# Patient Record
Sex: Female | Born: 1955 | Race: White | Hispanic: No | Marital: Single | Smoking: Former smoker
Health system: Southern US, Community
[De-identification: ages and names within clinical notes are randomized; demographics above are authoritative.]

## PROBLEM LIST (undated history)

## (undated) DIAGNOSIS — C801 Malignant (primary) neoplasm, unspecified: Secondary | ICD-10-CM

## (undated) DIAGNOSIS — R202 Paresthesia of skin: Secondary | ICD-10-CM

## (undated) DIAGNOSIS — K7460 Unspecified cirrhosis of liver: Secondary | ICD-10-CM

## (undated) DIAGNOSIS — N950 Postmenopausal bleeding: Secondary | ICD-10-CM

## (undated) DIAGNOSIS — K8020 Calculus of gallbladder without cholecystitis without obstruction: Secondary | ICD-10-CM

## (undated) DIAGNOSIS — C229 Malignant neoplasm of liver, not specified as primary or secondary: Secondary | ICD-10-CM

## (undated) DIAGNOSIS — F99 Mental disorder, not otherwise specified: Secondary | ICD-10-CM

## (undated) DIAGNOSIS — Z9889 Other specified postprocedural states: Secondary | ICD-10-CM

## (undated) DIAGNOSIS — F319 Bipolar disorder, unspecified: Secondary | ICD-10-CM

## (undated) DIAGNOSIS — R112 Nausea with vomiting, unspecified: Secondary | ICD-10-CM

## (undated) DIAGNOSIS — I10 Essential (primary) hypertension: Secondary | ICD-10-CM

## (undated) DIAGNOSIS — K759 Inflammatory liver disease, unspecified: Secondary | ICD-10-CM

## (undated) DIAGNOSIS — R51 Headache: Secondary | ICD-10-CM

## (undated) DIAGNOSIS — M1990 Unspecified osteoarthritis, unspecified site: Secondary | ICD-10-CM

## (undated) DIAGNOSIS — D61818 Other pancytopenia: Secondary | ICD-10-CM

## (undated) DIAGNOSIS — D649 Anemia, unspecified: Secondary | ICD-10-CM

## (undated) DIAGNOSIS — J449 Chronic obstructive pulmonary disease, unspecified: Secondary | ICD-10-CM

## (undated) DIAGNOSIS — K219 Gastro-esophageal reflux disease without esophagitis: Secondary | ICD-10-CM

## (undated) DIAGNOSIS — N920 Excessive and frequent menstruation with regular cycle: Secondary | ICD-10-CM

## (undated) DIAGNOSIS — R0602 Shortness of breath: Secondary | ICD-10-CM

## (undated) DIAGNOSIS — F1911 Other psychoactive substance abuse, in remission: Secondary | ICD-10-CM

## (undated) HISTORY — DX: Paresthesia of skin: R20.2

## (undated) HISTORY — DX: Other psychoactive substance abuse, in remission: F19.11

## (undated) HISTORY — PX: TUBAL LIGATION: SHX77

## (undated) HISTORY — DX: Bipolar disorder, unspecified: F31.9

## (undated) HISTORY — DX: Chronic obstructive pulmonary disease, unspecified: J44.9

## (undated) HISTORY — DX: Postmenopausal bleeding: N95.0

## (undated) HISTORY — PX: FACIAL RECONSTRUCTION SURGERY: SHX631

## (undated) HISTORY — DX: Excessive and frequent menstruation with regular cycle: N92.0

## (undated) HISTORY — DX: Essential (primary) hypertension: I10

## (undated) HISTORY — PX: MULTIPLE TOOTH EXTRACTIONS: SHX2053

---

## 1999-02-18 ENCOUNTER — Encounter: Admission: RE | Admit: 1999-02-18 | Discharge: 1999-02-18 | Payer: Self-pay | Admitting: Family Medicine

## 1999-03-07 ENCOUNTER — Encounter: Admission: RE | Admit: 1999-03-07 | Discharge: 1999-03-07 | Payer: Self-pay | Admitting: Family Medicine

## 1999-03-08 ENCOUNTER — Emergency Department (HOSPITAL_COMMUNITY): Admission: EM | Admit: 1999-03-08 | Discharge: 1999-03-08 | Payer: Self-pay | Admitting: Emergency Medicine

## 1999-03-21 ENCOUNTER — Encounter: Admission: RE | Admit: 1999-03-21 | Discharge: 1999-03-21 | Payer: Self-pay | Admitting: Family Medicine

## 1999-04-11 ENCOUNTER — Encounter: Admission: RE | Admit: 1999-04-11 | Discharge: 1999-04-11 | Payer: Self-pay | Admitting: Family Medicine

## 1999-04-15 ENCOUNTER — Encounter: Admission: RE | Admit: 1999-04-15 | Discharge: 1999-05-13 | Payer: Self-pay | Admitting: Sports Medicine

## 1999-08-05 ENCOUNTER — Encounter: Admission: RE | Admit: 1999-08-05 | Discharge: 1999-08-05 | Payer: Self-pay | Admitting: Family Medicine

## 1999-08-15 ENCOUNTER — Encounter: Admission: RE | Admit: 1999-08-15 | Discharge: 1999-08-15 | Payer: Self-pay | Admitting: Family Medicine

## 2000-03-20 ENCOUNTER — Encounter: Payer: Self-pay | Admitting: Emergency Medicine

## 2000-03-20 ENCOUNTER — Emergency Department (HOSPITAL_COMMUNITY): Admission: EM | Admit: 2000-03-20 | Discharge: 2000-03-20 | Payer: Self-pay | Admitting: Emergency Medicine

## 2000-03-24 ENCOUNTER — Encounter: Admission: RE | Admit: 2000-03-24 | Discharge: 2000-03-24 | Payer: Self-pay | Admitting: Family Medicine

## 2000-04-02 ENCOUNTER — Encounter: Admission: RE | Admit: 2000-04-02 | Discharge: 2000-05-14 | Payer: Self-pay | Admitting: Family Medicine

## 2000-05-07 ENCOUNTER — Encounter: Admission: RE | Admit: 2000-05-07 | Discharge: 2000-05-07 | Payer: Self-pay | Admitting: Family Medicine

## 2000-06-17 ENCOUNTER — Encounter: Admission: RE | Admit: 2000-06-17 | Discharge: 2000-06-17 | Payer: Self-pay | Admitting: Family Medicine

## 2001-01-22 ENCOUNTER — Encounter: Admission: RE | Admit: 2001-01-22 | Discharge: 2001-01-22 | Payer: Self-pay | Admitting: Sports Medicine

## 2001-02-03 ENCOUNTER — Encounter: Admission: RE | Admit: 2001-02-03 | Discharge: 2001-02-03 | Payer: Self-pay | Admitting: Family Medicine

## 2001-02-15 ENCOUNTER — Encounter: Admission: RE | Admit: 2001-02-15 | Discharge: 2001-02-15 | Payer: Self-pay | Admitting: Family Medicine

## 2001-04-08 ENCOUNTER — Encounter: Admission: RE | Admit: 2001-04-08 | Discharge: 2001-04-08 | Payer: Self-pay | Admitting: Family Medicine

## 2001-05-20 ENCOUNTER — Encounter: Admission: RE | Admit: 2001-05-20 | Discharge: 2001-05-20 | Payer: Self-pay | Admitting: Gastroenterology

## 2001-05-20 ENCOUNTER — Encounter: Payer: Self-pay | Admitting: Gastroenterology

## 2001-06-01 ENCOUNTER — Encounter: Admission: RE | Admit: 2001-06-01 | Discharge: 2001-06-01 | Payer: Self-pay | Admitting: Family Medicine

## 2001-06-03 ENCOUNTER — Encounter: Admission: RE | Admit: 2001-06-03 | Discharge: 2001-06-03 | Payer: Self-pay | Admitting: Sports Medicine

## 2001-06-03 ENCOUNTER — Encounter: Payer: Self-pay | Admitting: Sports Medicine

## 2002-01-03 ENCOUNTER — Encounter: Admission: RE | Admit: 2002-01-03 | Discharge: 2002-01-03 | Payer: Self-pay | Admitting: Family Medicine

## 2002-01-25 ENCOUNTER — Encounter: Admission: RE | Admit: 2002-01-25 | Discharge: 2002-01-25 | Payer: Self-pay | Admitting: Family Medicine

## 2002-04-21 ENCOUNTER — Encounter: Admission: RE | Admit: 2002-04-21 | Discharge: 2002-04-21 | Payer: Self-pay | Admitting: Family Medicine

## 2002-05-19 ENCOUNTER — Encounter: Admission: RE | Admit: 2002-05-19 | Discharge: 2002-05-19 | Payer: Self-pay | Admitting: Family Medicine

## 2002-06-06 ENCOUNTER — Encounter: Admission: RE | Admit: 2002-06-06 | Discharge: 2002-06-06 | Payer: Self-pay | Admitting: Family Medicine

## 2002-08-04 ENCOUNTER — Encounter: Admission: RE | Admit: 2002-08-04 | Discharge: 2002-08-04 | Payer: Self-pay | Admitting: Sports Medicine

## 2002-08-11 ENCOUNTER — Emergency Department (HOSPITAL_COMMUNITY): Admission: EM | Admit: 2002-08-11 | Discharge: 2002-08-11 | Payer: Self-pay | Admitting: Emergency Medicine

## 2002-08-11 ENCOUNTER — Encounter: Admission: RE | Admit: 2002-08-11 | Discharge: 2002-08-11 | Payer: Self-pay | Admitting: Family Medicine

## 2002-08-11 ENCOUNTER — Encounter: Payer: Self-pay | Admitting: Emergency Medicine

## 2002-08-16 ENCOUNTER — Encounter: Admission: RE | Admit: 2002-08-16 | Discharge: 2002-08-16 | Payer: Self-pay | Admitting: Sports Medicine

## 2002-08-16 ENCOUNTER — Encounter: Payer: Self-pay | Admitting: Sports Medicine

## 2002-08-16 ENCOUNTER — Encounter: Admission: RE | Admit: 2002-08-16 | Discharge: 2002-08-16 | Payer: Self-pay | Admitting: *Deleted

## 2002-08-29 ENCOUNTER — Encounter: Admission: RE | Admit: 2002-08-29 | Discharge: 2002-08-29 | Payer: Self-pay | Admitting: Family Medicine

## 2002-09-09 ENCOUNTER — Encounter: Admission: RE | Admit: 2002-09-09 | Discharge: 2002-09-09 | Payer: Self-pay | Admitting: Family Medicine

## 2002-09-09 ENCOUNTER — Ambulatory Visit (HOSPITAL_COMMUNITY): Admission: RE | Admit: 2002-09-09 | Discharge: 2002-09-09 | Payer: Self-pay | Admitting: Family Medicine

## 2002-10-06 ENCOUNTER — Encounter: Admission: RE | Admit: 2002-10-06 | Discharge: 2002-10-06 | Payer: Self-pay | Admitting: Sports Medicine

## 2003-04-06 ENCOUNTER — Emergency Department (HOSPITAL_COMMUNITY): Admission: AC | Admit: 2003-04-06 | Discharge: 2003-04-06 | Payer: Self-pay | Admitting: Emergency Medicine

## 2003-04-27 ENCOUNTER — Encounter: Admission: RE | Admit: 2003-04-27 | Discharge: 2003-04-27 | Payer: Self-pay | Admitting: Family Medicine

## 2003-05-09 ENCOUNTER — Encounter: Admission: RE | Admit: 2003-05-09 | Discharge: 2003-05-09 | Payer: Self-pay | Admitting: Sports Medicine

## 2003-06-28 ENCOUNTER — Encounter: Admission: RE | Admit: 2003-06-28 | Discharge: 2003-06-28 | Payer: Self-pay | Admitting: Family Medicine

## 2003-07-04 ENCOUNTER — Encounter
Admission: RE | Admit: 2003-07-04 | Discharge: 2003-10-02 | Payer: Self-pay | Admitting: Physical Medicine and Rehabilitation

## 2003-07-06 ENCOUNTER — Encounter: Admission: RE | Admit: 2003-07-06 | Discharge: 2003-07-06 | Payer: Self-pay | Admitting: Family Medicine

## 2003-07-25 ENCOUNTER — Encounter: Admission: RE | Admit: 2003-07-25 | Discharge: 2003-07-25 | Payer: Self-pay | Admitting: Sports Medicine

## 2003-08-08 ENCOUNTER — Encounter: Admission: RE | Admit: 2003-08-08 | Discharge: 2003-08-08 | Payer: Self-pay | Admitting: Family Medicine

## 2004-01-17 ENCOUNTER — Other Ambulatory Visit: Admission: RE | Admit: 2004-01-17 | Discharge: 2004-01-17 | Payer: Self-pay | Admitting: Family Medicine

## 2004-01-17 ENCOUNTER — Encounter: Admission: RE | Admit: 2004-01-17 | Discharge: 2004-01-17 | Payer: Self-pay | Admitting: Family Medicine

## 2004-02-02 ENCOUNTER — Encounter: Admission: RE | Admit: 2004-02-02 | Discharge: 2004-02-02 | Payer: Self-pay | Admitting: Sports Medicine

## 2004-02-06 ENCOUNTER — Encounter: Admission: RE | Admit: 2004-02-06 | Discharge: 2004-02-06 | Payer: Self-pay | Admitting: Family Medicine

## 2004-05-07 ENCOUNTER — Ambulatory Visit: Payer: Self-pay | Admitting: Family Medicine

## 2004-05-22 ENCOUNTER — Ambulatory Visit: Payer: Self-pay | Admitting: Family Medicine

## 2004-05-27 ENCOUNTER — Ambulatory Visit: Payer: Self-pay | Admitting: Sports Medicine

## 2004-06-11 ENCOUNTER — Ambulatory Visit: Payer: Self-pay | Admitting: Family Medicine

## 2004-07-10 ENCOUNTER — Ambulatory Visit: Payer: Self-pay | Admitting: Family Medicine

## 2004-08-08 ENCOUNTER — Ambulatory Visit: Payer: Self-pay | Admitting: Family Medicine

## 2004-09-18 ENCOUNTER — Ambulatory Visit: Payer: Self-pay | Admitting: Family Medicine

## 2004-10-24 ENCOUNTER — Ambulatory Visit: Payer: Self-pay | Admitting: Sports Medicine

## 2005-01-08 ENCOUNTER — Ambulatory Visit: Payer: Self-pay | Admitting: Family Medicine

## 2005-02-18 ENCOUNTER — Encounter: Admission: RE | Admit: 2005-02-18 | Discharge: 2005-02-18 | Payer: Self-pay | Admitting: Sports Medicine

## 2005-03-26 ENCOUNTER — Encounter (INDEPENDENT_AMBULATORY_CARE_PROVIDER_SITE_OTHER): Payer: Self-pay | Admitting: *Deleted

## 2005-03-26 LAB — CONVERTED CEMR LAB

## 2005-03-27 ENCOUNTER — Ambulatory Visit: Payer: Self-pay | Admitting: Family Medicine

## 2005-03-27 ENCOUNTER — Inpatient Hospital Stay (HOSPITAL_COMMUNITY): Admission: AD | Admit: 2005-03-27 | Discharge: 2005-03-27 | Payer: Self-pay | Admitting: Obstetrics & Gynecology

## 2005-03-27 ENCOUNTER — Other Ambulatory Visit: Admission: RE | Admit: 2005-03-27 | Discharge: 2005-03-27 | Payer: Self-pay | Admitting: Family Medicine

## 2005-04-14 ENCOUNTER — Ambulatory Visit: Payer: Self-pay | Admitting: Family Medicine

## 2005-05-12 ENCOUNTER — Ambulatory Visit: Payer: Self-pay | Admitting: Family Medicine

## 2005-05-26 ENCOUNTER — Inpatient Hospital Stay (HOSPITAL_COMMUNITY): Admission: AD | Admit: 2005-05-26 | Discharge: 2005-05-26 | Payer: Self-pay | Admitting: Obstetrics and Gynecology

## 2005-06-03 ENCOUNTER — Ambulatory Visit: Payer: Self-pay | Admitting: Obstetrics & Gynecology

## 2005-06-06 ENCOUNTER — Ambulatory Visit: Payer: Self-pay | Admitting: Family Medicine

## 2005-06-16 ENCOUNTER — Ambulatory Visit: Payer: Self-pay | Admitting: Family Medicine

## 2005-08-11 ENCOUNTER — Ambulatory Visit: Payer: Self-pay | Admitting: Sports Medicine

## 2005-08-17 ENCOUNTER — Inpatient Hospital Stay (HOSPITAL_COMMUNITY): Admission: EM | Admit: 2005-08-17 | Discharge: 2005-08-23 | Payer: Self-pay | Admitting: Psychiatry

## 2005-08-17 ENCOUNTER — Emergency Department (HOSPITAL_COMMUNITY): Admission: EM | Admit: 2005-08-17 | Discharge: 2005-08-17 | Payer: Self-pay | Admitting: Emergency Medicine

## 2005-08-18 ENCOUNTER — Ambulatory Visit: Payer: Self-pay | Admitting: Psychiatry

## 2005-12-05 ENCOUNTER — Ambulatory Visit: Payer: Self-pay | Admitting: Family Medicine

## 2006-02-06 ENCOUNTER — Ambulatory Visit: Payer: Self-pay | Admitting: Family Medicine

## 2006-05-13 ENCOUNTER — Ambulatory Visit: Payer: Self-pay | Admitting: Sports Medicine

## 2006-07-06 ENCOUNTER — Ambulatory Visit: Payer: Self-pay | Admitting: Sports Medicine

## 2006-08-10 ENCOUNTER — Other Ambulatory Visit: Admission: RE | Admit: 2006-08-10 | Discharge: 2006-08-10 | Payer: Self-pay | Admitting: Family Medicine

## 2006-08-10 ENCOUNTER — Encounter (INDEPENDENT_AMBULATORY_CARE_PROVIDER_SITE_OTHER): Payer: Self-pay | Admitting: Family Medicine

## 2006-08-10 ENCOUNTER — Ambulatory Visit: Payer: Self-pay | Admitting: Sports Medicine

## 2006-08-10 LAB — CONVERTED CEMR LAB
ALT: 33 units/L (ref 0–35)
AST: 49 units/L — ABNORMAL HIGH (ref 0–37)
Albumin: 3.9 g/dL (ref 3.5–5.2)
Alkaline Phosphatase: 93 units/L (ref 39–117)
BUN: 8 mg/dL (ref 6–23)
CO2: 22 meq/L (ref 19–32)
Calcium: 8.9 mg/dL (ref 8.4–10.5)
Chlamydia, DNA Probe: NEGATIVE
Chloride: 109 meq/L (ref 96–112)
Cholesterol: 132 mg/dL (ref 0–200)
Creatinine, Ser: 0.76 mg/dL (ref 0.40–1.20)
GC Probe Amp, Genital: NEGATIVE
Glucose, Bld: 81 mg/dL (ref 70–99)
HDL: 40 mg/dL (ref >39)
LDL Cholesterol: 76 mg/dL (ref 0–99)
Lithium Lvl: 0.96 meq/L (ref 0.80–1.40)
Potassium: 4.6 meq/L (ref 3.5–5.3)
Sodium: 140 meq/L (ref 135–145)
Total Bilirubin: 0.4 mg/dL (ref 0.3–1.2)
Total CHOL/HDL Ratio: 3.3
Total Protein: 7.6 g/dL (ref 6.0–8.3)
Triglycerides: 82 mg/dL (ref <150)
VLDL: 16 mg/dL (ref 0–40)

## 2006-09-18 ENCOUNTER — Encounter (INDEPENDENT_AMBULATORY_CARE_PROVIDER_SITE_OTHER): Payer: Self-pay | Admitting: *Deleted

## 2006-10-22 ENCOUNTER — Telehealth: Payer: Self-pay | Admitting: Psychology

## 2006-11-18 ENCOUNTER — Ambulatory Visit: Payer: Self-pay | Admitting: Family Medicine

## 2006-11-18 DIAGNOSIS — F319 Bipolar disorder, unspecified: Secondary | ICD-10-CM | POA: Insufficient documentation

## 2006-11-26 ENCOUNTER — Telehealth: Payer: Self-pay | Admitting: Psychology

## 2006-12-01 ENCOUNTER — Telehealth: Payer: Self-pay | Admitting: Psychology

## 2006-12-07 ENCOUNTER — Telehealth: Payer: Self-pay | Admitting: Psychology

## 2006-12-23 ENCOUNTER — Ambulatory Visit: Payer: Self-pay | Admitting: Family Medicine

## 2007-01-20 ENCOUNTER — Telehealth: Payer: Self-pay | Admitting: Psychology

## 2007-02-22 ENCOUNTER — Encounter (INDEPENDENT_AMBULATORY_CARE_PROVIDER_SITE_OTHER): Payer: Self-pay | Admitting: *Deleted

## 2007-02-22 ENCOUNTER — Ambulatory Visit (HOSPITAL_COMMUNITY): Admission: RE | Admit: 2007-02-22 | Discharge: 2007-02-22 | Payer: Self-pay | Admitting: Sports Medicine

## 2007-02-23 ENCOUNTER — Telehealth (INDEPENDENT_AMBULATORY_CARE_PROVIDER_SITE_OTHER): Payer: Self-pay | Admitting: *Deleted

## 2007-02-25 ENCOUNTER — Encounter (INDEPENDENT_AMBULATORY_CARE_PROVIDER_SITE_OTHER): Payer: Self-pay | Admitting: *Deleted

## 2007-03-04 ENCOUNTER — Telehealth (INDEPENDENT_AMBULATORY_CARE_PROVIDER_SITE_OTHER): Payer: Self-pay | Admitting: *Deleted

## 2007-03-08 ENCOUNTER — Telehealth (INDEPENDENT_AMBULATORY_CARE_PROVIDER_SITE_OTHER): Payer: Self-pay | Admitting: *Deleted

## 2007-03-12 ENCOUNTER — Telehealth (INDEPENDENT_AMBULATORY_CARE_PROVIDER_SITE_OTHER): Payer: Self-pay | Admitting: *Deleted

## 2007-04-07 ENCOUNTER — Ambulatory Visit: Payer: Self-pay | Admitting: Family Medicine

## 2007-04-07 ENCOUNTER — Telehealth (INDEPENDENT_AMBULATORY_CARE_PROVIDER_SITE_OTHER): Payer: Self-pay | Admitting: *Deleted

## 2007-05-11 ENCOUNTER — Ambulatory Visit: Payer: Self-pay | Admitting: Family Medicine

## 2007-05-11 DIAGNOSIS — I10 Essential (primary) hypertension: Secondary | ICD-10-CM

## 2007-05-11 DIAGNOSIS — J449 Chronic obstructive pulmonary disease, unspecified: Secondary | ICD-10-CM

## 2007-06-02 ENCOUNTER — Telehealth: Payer: Self-pay | Admitting: Psychology

## 2007-06-09 ENCOUNTER — Ambulatory Visit: Payer: Self-pay | Admitting: Psychology

## 2007-06-09 ENCOUNTER — Encounter (INDEPENDENT_AMBULATORY_CARE_PROVIDER_SITE_OTHER): Payer: Self-pay | Admitting: *Deleted

## 2007-06-09 LAB — CONVERTED CEMR LAB
Lithium Lvl: 0.91 meq/L (ref 0.80–1.40)
TSH: 3.023 microintl units/mL (ref 0.350–5.50)

## 2007-06-10 ENCOUNTER — Encounter (INDEPENDENT_AMBULATORY_CARE_PROVIDER_SITE_OTHER): Payer: Self-pay | Admitting: *Deleted

## 2007-06-11 ENCOUNTER — Ambulatory Visit: Payer: Self-pay | Admitting: Family Medicine

## 2007-06-11 DIAGNOSIS — F17200 Nicotine dependence, unspecified, uncomplicated: Secondary | ICD-10-CM | POA: Insufficient documentation

## 2007-07-30 ENCOUNTER — Ambulatory Visit: Payer: Self-pay | Admitting: Family Medicine

## 2007-08-26 ENCOUNTER — Telehealth: Payer: Self-pay | Admitting: Psychology

## 2007-08-27 ENCOUNTER — Telehealth (INDEPENDENT_AMBULATORY_CARE_PROVIDER_SITE_OTHER): Payer: Self-pay | Admitting: *Deleted

## 2007-09-08 ENCOUNTER — Ambulatory Visit: Payer: Self-pay | Admitting: Psychology

## 2007-09-08 ENCOUNTER — Ambulatory Visit: Payer: Self-pay | Admitting: Family Medicine

## 2007-09-21 ENCOUNTER — Encounter (INDEPENDENT_AMBULATORY_CARE_PROVIDER_SITE_OTHER): Payer: Self-pay | Admitting: *Deleted

## 2007-09-23 ENCOUNTER — Encounter (INDEPENDENT_AMBULATORY_CARE_PROVIDER_SITE_OTHER): Payer: Self-pay | Admitting: *Deleted

## 2007-09-23 ENCOUNTER — Telehealth (INDEPENDENT_AMBULATORY_CARE_PROVIDER_SITE_OTHER): Payer: Self-pay | Admitting: *Deleted

## 2007-09-23 ENCOUNTER — Ambulatory Visit: Payer: Self-pay | Admitting: Family Medicine

## 2007-09-29 ENCOUNTER — Ambulatory Visit: Payer: Self-pay | Admitting: Family Medicine

## 2007-09-29 DIAGNOSIS — M5412 Radiculopathy, cervical region: Secondary | ICD-10-CM | POA: Insufficient documentation

## 2007-10-01 ENCOUNTER — Encounter (INDEPENDENT_AMBULATORY_CARE_PROVIDER_SITE_OTHER): Payer: Self-pay | Admitting: *Deleted

## 2007-10-13 ENCOUNTER — Ambulatory Visit: Payer: Self-pay | Admitting: Family Medicine

## 2007-10-13 ENCOUNTER — Encounter (INDEPENDENT_AMBULATORY_CARE_PROVIDER_SITE_OTHER): Payer: Self-pay | Admitting: *Deleted

## 2007-10-13 LAB — CONVERTED CEMR LAB
ALT: 50 units/L — ABNORMAL HIGH (ref 0–35)
AST: 64 units/L — ABNORMAL HIGH (ref 0–37)
Albumin: 4 g/dL (ref 3.5–5.2)
Alkaline Phosphatase: 98 units/L (ref 39–117)
BUN: 15 mg/dL (ref 6–23)
CO2: 21 meq/L (ref 19–32)
Calcium: 9.5 mg/dL (ref 8.4–10.5)
Chloride: 108 meq/L (ref 96–112)
Cholesterol: 139 mg/dL (ref 0–200)
Creatinine, Ser: 0.83 mg/dL (ref 0.40–1.20)
Glucose, Bld: 87 mg/dL (ref 70–99)
HCV Ab: POSITIVE — AB
HDL: 40 mg/dL (ref >39)
Hep B S Ab: NEGATIVE
Hepatitis B Surface Ag: NEGATIVE
LDL Cholesterol: 74 mg/dL (ref 0–99)
Lithium Lvl: 0.79 meq/L — ABNORMAL LOW (ref 0.80–1.40)
Potassium: 4.8 meq/L (ref 3.5–5.3)
Sodium: 138 meq/L (ref 135–145)
TSH: 2.316 microintl units/mL (ref 0.350–5.50)
Total Bilirubin: 0.2 mg/dL — ABNORMAL LOW (ref 0.3–1.2)
Total CHOL/HDL Ratio: 3.5
Total Protein: 7.7 g/dL (ref 6.0–8.3)
Triglycerides: 127 mg/dL (ref <150)
VLDL: 25 mg/dL (ref 0–40)

## 2007-10-18 ENCOUNTER — Ambulatory Visit: Payer: Self-pay | Admitting: Family Medicine

## 2007-11-15 ENCOUNTER — Ambulatory Visit: Payer: Self-pay | Admitting: Family Medicine

## 2007-11-15 ENCOUNTER — Encounter (INDEPENDENT_AMBULATORY_CARE_PROVIDER_SITE_OTHER): Payer: Self-pay | Admitting: *Deleted

## 2007-11-19 ENCOUNTER — Encounter (INDEPENDENT_AMBULATORY_CARE_PROVIDER_SITE_OTHER): Payer: Self-pay | Admitting: *Deleted

## 2007-11-23 ENCOUNTER — Telehealth: Payer: Self-pay | Admitting: Family Medicine

## 2007-12-15 ENCOUNTER — Ambulatory Visit: Payer: Self-pay | Admitting: Family Medicine

## 2007-12-21 ENCOUNTER — Telehealth (INDEPENDENT_AMBULATORY_CARE_PROVIDER_SITE_OTHER): Payer: Self-pay | Admitting: *Deleted

## 2007-12-21 ENCOUNTER — Ambulatory Visit: Payer: Self-pay | Admitting: Family Medicine

## 2007-12-21 ENCOUNTER — Encounter: Admission: RE | Admit: 2007-12-21 | Discharge: 2007-12-21 | Payer: Self-pay | Admitting: *Deleted

## 2007-12-21 ENCOUNTER — Encounter (INDEPENDENT_AMBULATORY_CARE_PROVIDER_SITE_OTHER): Payer: Self-pay | Admitting: *Deleted

## 2007-12-21 LAB — CONVERTED CEMR LAB
CO2: 23 meq/L (ref 19–32)
Glucose, Bld: 73 mg/dL (ref 70–99)
Potassium: 4.2 meq/L (ref 3.5–5.3)
Sodium: 140 meq/L (ref 135–145)

## 2007-12-23 ENCOUNTER — Telehealth (INDEPENDENT_AMBULATORY_CARE_PROVIDER_SITE_OTHER): Payer: Self-pay | Admitting: *Deleted

## 2007-12-24 ENCOUNTER — Encounter: Admission: RE | Admit: 2007-12-24 | Discharge: 2007-12-24 | Payer: Self-pay | Admitting: *Deleted

## 2007-12-27 ENCOUNTER — Telehealth: Payer: Self-pay | Admitting: Psychology

## 2007-12-29 ENCOUNTER — Telehealth (INDEPENDENT_AMBULATORY_CARE_PROVIDER_SITE_OTHER): Payer: Self-pay | Admitting: *Deleted

## 2008-01-26 ENCOUNTER — Ambulatory Visit: Payer: Self-pay | Admitting: Psychology

## 2008-02-09 ENCOUNTER — Ambulatory Visit: Payer: Self-pay | Admitting: Family Medicine

## 2008-02-09 ENCOUNTER — Encounter: Payer: Self-pay | Admitting: Psychology

## 2008-02-10 LAB — CONVERTED CEMR LAB
CO2: 23 meq/L (ref 19–32)
Calcium: 9.8 mg/dL (ref 8.4–10.5)
Glucose, Bld: 58 mg/dL — ABNORMAL LOW (ref 70–99)
Lithium Lvl: 1.36 meq/L (ref 0.80–1.40)
Sodium: 139 meq/L (ref 135–145)
TSH: 0.027 microintl units/mL — ABNORMAL LOW (ref 0.350–4.50)
Total Bilirubin: 0.3 mg/dL (ref 0.3–1.2)
Total Protein: 6.8 g/dL (ref 6.0–8.3)

## 2008-03-08 ENCOUNTER — Telehealth: Payer: Self-pay | Admitting: Psychology

## 2008-03-23 ENCOUNTER — Telehealth: Payer: Self-pay | Admitting: Psychology

## 2008-03-29 ENCOUNTER — Telehealth: Payer: Self-pay | Admitting: *Deleted

## 2008-04-05 ENCOUNTER — Ambulatory Visit: Payer: Self-pay | Admitting: Family Medicine

## 2008-04-28 ENCOUNTER — Encounter (INDEPENDENT_AMBULATORY_CARE_PROVIDER_SITE_OTHER): Payer: Self-pay | Admitting: *Deleted

## 2008-04-28 ENCOUNTER — Encounter (INDEPENDENT_AMBULATORY_CARE_PROVIDER_SITE_OTHER): Payer: Self-pay | Admitting: Family Medicine

## 2008-04-28 ENCOUNTER — Ambulatory Visit: Payer: Self-pay | Admitting: Family Medicine

## 2008-04-28 ENCOUNTER — Telehealth (INDEPENDENT_AMBULATORY_CARE_PROVIDER_SITE_OTHER): Payer: Self-pay | Admitting: *Deleted

## 2008-04-28 LAB — CONVERTED CEMR LAB
AST: 37 units/L (ref 0–37)
BUN: 10 mg/dL (ref 6–23)
CO2: 22 meq/L (ref 19–32)
Calcium: 9.5 mg/dL (ref 8.4–10.5)
Chlamydia, DNA Probe: NEGATIVE
Chloride: 108 meq/L (ref 96–112)
Creatinine, Ser: 0.74 mg/dL (ref 0.40–1.20)
Direct LDL: 86 mg/dL
Glucose, Bld: 105 mg/dL — ABNORMAL HIGH (ref 70–99)
Lithium Lvl: 0.83 meq/L (ref 0.80–1.40)
Pap Smear: NORMAL

## 2008-05-05 ENCOUNTER — Encounter (INDEPENDENT_AMBULATORY_CARE_PROVIDER_SITE_OTHER): Payer: Self-pay | Admitting: Family Medicine

## 2008-05-24 ENCOUNTER — Telehealth: Payer: Self-pay | Admitting: Psychology

## 2008-06-07 ENCOUNTER — Ambulatory Visit: Payer: Self-pay | Admitting: Family Medicine

## 2008-07-03 ENCOUNTER — Ambulatory Visit: Payer: Self-pay | Admitting: Family Medicine

## 2008-08-22 ENCOUNTER — Encounter (INDEPENDENT_AMBULATORY_CARE_PROVIDER_SITE_OTHER): Payer: Self-pay | Admitting: Family Medicine

## 2008-08-23 ENCOUNTER — Telehealth: Payer: Self-pay | Admitting: Psychology

## 2008-09-06 ENCOUNTER — Telehealth: Payer: Self-pay | Admitting: Psychology

## 2008-09-15 ENCOUNTER — Ambulatory Visit: Payer: Self-pay | Admitting: Family Medicine

## 2008-09-18 ENCOUNTER — Telehealth: Payer: Self-pay | Admitting: Psychology

## 2008-09-28 ENCOUNTER — Telehealth: Payer: Self-pay | Admitting: Psychology

## 2008-09-29 ENCOUNTER — Encounter: Payer: Self-pay | Admitting: Psychology

## 2008-10-03 ENCOUNTER — Ambulatory Visit: Payer: Self-pay | Admitting: Family Medicine

## 2008-10-03 ENCOUNTER — Telehealth (INDEPENDENT_AMBULATORY_CARE_PROVIDER_SITE_OTHER): Payer: Self-pay | Admitting: *Deleted

## 2008-10-03 ENCOUNTER — Encounter: Admission: RE | Admit: 2008-10-03 | Discharge: 2008-10-03 | Payer: Self-pay | Admitting: Family Medicine

## 2008-10-06 ENCOUNTER — Telehealth: Payer: Self-pay | Admitting: Psychology

## 2008-10-24 ENCOUNTER — Telehealth: Payer: Self-pay | Admitting: Psychology

## 2008-10-25 ENCOUNTER — Telehealth: Payer: Self-pay | Admitting: Psychology

## 2008-11-01 ENCOUNTER — Telehealth: Payer: Self-pay | Admitting: Psychology

## 2008-11-08 ENCOUNTER — Ambulatory Visit: Payer: Self-pay | Admitting: Family Medicine

## 2008-11-09 ENCOUNTER — Encounter: Payer: Self-pay | Admitting: *Deleted

## 2008-11-09 ENCOUNTER — Encounter: Payer: Self-pay | Admitting: Psychology

## 2008-11-22 ENCOUNTER — Ambulatory Visit: Payer: Self-pay | Admitting: Family Medicine

## 2008-12-11 ENCOUNTER — Encounter (INDEPENDENT_AMBULATORY_CARE_PROVIDER_SITE_OTHER): Payer: Self-pay | Admitting: Family Medicine

## 2009-01-24 ENCOUNTER — Telehealth (INDEPENDENT_AMBULATORY_CARE_PROVIDER_SITE_OTHER): Payer: Self-pay | Admitting: *Deleted

## 2009-01-24 ENCOUNTER — Telehealth: Payer: Self-pay | Admitting: Psychology

## 2009-01-26 ENCOUNTER — Ambulatory Visit: Payer: Self-pay | Admitting: Family Medicine

## 2009-01-26 DIAGNOSIS — E669 Obesity, unspecified: Secondary | ICD-10-CM | POA: Insufficient documentation

## 2009-01-26 DIAGNOSIS — N920 Excessive and frequent menstruation with regular cycle: Secondary | ICD-10-CM

## 2009-01-29 ENCOUNTER — Telehealth: Payer: Self-pay | Admitting: *Deleted

## 2009-01-30 ENCOUNTER — Encounter: Payer: Self-pay | Admitting: *Deleted

## 2009-02-09 ENCOUNTER — Telehealth: Payer: Self-pay | Admitting: Psychology

## 2009-03-05 ENCOUNTER — Ambulatory Visit: Payer: Self-pay | Admitting: Family Medicine

## 2009-03-05 ENCOUNTER — Telehealth: Payer: Self-pay | Admitting: Family Medicine

## 2009-03-22 ENCOUNTER — Telehealth: Payer: Self-pay | Admitting: Psychology

## 2009-03-29 ENCOUNTER — Ambulatory Visit: Payer: Self-pay | Admitting: Family Medicine

## 2009-04-12 ENCOUNTER — Ambulatory Visit: Payer: Self-pay | Admitting: Obstetrics & Gynecology

## 2009-04-19 ENCOUNTER — Telehealth: Payer: Self-pay | Admitting: Psychology

## 2009-04-19 ENCOUNTER — Ambulatory Visit (HOSPITAL_COMMUNITY): Admission: RE | Admit: 2009-04-19 | Discharge: 2009-04-19 | Payer: Self-pay | Admitting: Obstetrics & Gynecology

## 2009-04-26 ENCOUNTER — Other Ambulatory Visit: Admission: RE | Admit: 2009-04-26 | Discharge: 2009-04-26 | Payer: Self-pay | Admitting: Obstetrics & Gynecology

## 2009-04-26 ENCOUNTER — Ambulatory Visit: Payer: Self-pay | Admitting: Obstetrics and Gynecology

## 2009-05-15 ENCOUNTER — Telehealth: Payer: Self-pay | Admitting: Psychology

## 2009-05-24 ENCOUNTER — Ambulatory Visit: Payer: Self-pay | Admitting: Obstetrics and Gynecology

## 2009-05-24 ENCOUNTER — Encounter: Payer: Self-pay | Admitting: Family Medicine

## 2009-06-06 ENCOUNTER — Ambulatory Visit: Payer: Self-pay | Admitting: Family Medicine

## 2009-06-08 ENCOUNTER — Telehealth: Payer: Self-pay | Admitting: Family Medicine

## 2009-06-08 ENCOUNTER — Ambulatory Visit: Payer: Self-pay | Admitting: Family Medicine

## 2009-06-08 DIAGNOSIS — F1910 Other psychoactive substance abuse, uncomplicated: Secondary | ICD-10-CM

## 2009-06-08 DIAGNOSIS — R209 Unspecified disturbances of skin sensation: Secondary | ICD-10-CM

## 2009-06-11 ENCOUNTER — Emergency Department (HOSPITAL_COMMUNITY): Admission: EM | Admit: 2009-06-11 | Discharge: 2009-06-12 | Payer: Self-pay | Admitting: Emergency Medicine

## 2009-06-18 ENCOUNTER — Telehealth: Payer: Self-pay | Admitting: Psychology

## 2009-06-19 ENCOUNTER — Encounter: Payer: Self-pay | Admitting: Psychology

## 2009-06-19 ENCOUNTER — Ambulatory Visit: Payer: Self-pay | Admitting: Family Medicine

## 2009-06-20 ENCOUNTER — Telehealth: Payer: Self-pay | Admitting: Psychology

## 2009-06-21 ENCOUNTER — Ambulatory Visit: Payer: Self-pay | Admitting: Family Medicine

## 2009-06-21 ENCOUNTER — Encounter: Payer: Self-pay | Admitting: Psychology

## 2009-06-22 ENCOUNTER — Ambulatory Visit: Payer: Self-pay | Admitting: Family Medicine

## 2009-06-22 ENCOUNTER — Telehealth: Payer: Self-pay | Admitting: Psychology

## 2009-06-26 ENCOUNTER — Telehealth: Payer: Self-pay | Admitting: Psychology

## 2009-06-27 ENCOUNTER — Telehealth: Payer: Self-pay | Admitting: Family Medicine

## 2009-08-03 ENCOUNTER — Ambulatory Visit: Payer: Self-pay | Admitting: Family Medicine

## 2009-08-06 ENCOUNTER — Encounter: Admission: RE | Admit: 2009-08-06 | Discharge: 2009-08-06 | Payer: Self-pay | Admitting: Family Medicine

## 2009-08-06 ENCOUNTER — Ambulatory Visit: Payer: Self-pay | Admitting: Family Medicine

## 2009-08-17 ENCOUNTER — Telehealth: Payer: Self-pay | Admitting: Psychology

## 2009-08-22 ENCOUNTER — Ambulatory Visit: Payer: Self-pay | Admitting: Family Medicine

## 2009-09-18 ENCOUNTER — Telehealth: Payer: Self-pay | Admitting: Psychology

## 2009-10-09 ENCOUNTER — Telehealth: Payer: Self-pay | Admitting: Family Medicine

## 2009-10-22 ENCOUNTER — Telehealth: Payer: Self-pay | Admitting: Psychology

## 2010-01-16 ENCOUNTER — Emergency Department (HOSPITAL_COMMUNITY): Admission: EM | Admit: 2010-01-16 | Discharge: 2010-01-16 | Payer: Self-pay | Admitting: Emergency Medicine

## 2010-01-18 ENCOUNTER — Emergency Department (HOSPITAL_COMMUNITY): Admission: EM | Admit: 2010-01-18 | Discharge: 2010-01-18 | Payer: Self-pay | Admitting: Family Medicine

## 2010-01-20 ENCOUNTER — Emergency Department (HOSPITAL_COMMUNITY): Admission: EM | Admit: 2010-01-20 | Discharge: 2010-01-20 | Payer: Self-pay | Admitting: Emergency Medicine

## 2010-01-23 ENCOUNTER — Emergency Department (HOSPITAL_COMMUNITY): Admission: EM | Admit: 2010-01-23 | Discharge: 2010-01-23 | Payer: Self-pay | Admitting: Family Medicine

## 2010-06-25 ENCOUNTER — Encounter: Payer: Self-pay | Admitting: Family Medicine

## 2010-07-02 ENCOUNTER — Ambulatory Visit (HOSPITAL_COMMUNITY): Payer: Self-pay | Admitting: Psychiatry

## 2010-07-17 ENCOUNTER — Ambulatory Visit
Admission: RE | Admit: 2010-07-17 | Discharge: 2010-07-17 | Payer: Self-pay | Source: Home / Self Care | Attending: Family Medicine | Admitting: Family Medicine

## 2010-07-17 ENCOUNTER — Ambulatory Visit: Payer: Self-pay

## 2010-07-17 DIAGNOSIS — M545 Low back pain: Secondary | ICD-10-CM

## 2010-07-17 DIAGNOSIS — R300 Dysuria: Secondary | ICD-10-CM

## 2010-07-17 LAB — CONVERTED CEMR LAB
Nitrite: NEGATIVE
Specific Gravity, Urine: 1.015
WBC Urine, dipstick: NEGATIVE

## 2010-07-29 ENCOUNTER — Encounter: Payer: Self-pay | Admitting: Family Medicine

## 2010-07-29 ENCOUNTER — Ambulatory Visit: Admission: RE | Admit: 2010-07-29 | Discharge: 2010-07-29 | Payer: Self-pay | Source: Home / Self Care

## 2010-07-29 DIAGNOSIS — N760 Acute vaginitis: Secondary | ICD-10-CM | POA: Insufficient documentation

## 2010-08-02 ENCOUNTER — Telehealth: Payer: Self-pay | Admitting: Family Medicine

## 2010-08-11 ENCOUNTER — Encounter: Payer: Self-pay | Admitting: Family Medicine

## 2010-08-11 ENCOUNTER — Encounter: Payer: Self-pay | Admitting: *Deleted

## 2010-08-20 NOTE — Progress Notes (Signed)
Summary: phn msg  Phone Note Call from Patient Call back at Home Phone [phone]   Caller: Patient Summary of Call: called to say she went to Ortho yesterday and has arthritis in both knees wants to know if she can take Aleve - has Hep & liver prob pls advise Initial call taken by: De Nurse,  October 09, 2009 1:35 PM  Follow-up for Phone Call        spoke with Dr. Deirdre Priest. stated she can take the aleve (with food) (report stomach problems) she says she has been told to never take tylenol to try the aleve & report back how well it works Follow-up by: Golden Circle RN,  October 09, 2009 1:37 PM
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
## 2010-08-20 NOTE — Assessment & Plan Note (Signed)
Summary: [facility]   Primary Care Provider:  Ellin Mayhew MD   History of Present Illness: Dorothy Burns wants to talk about Lunesta.  She is "tired" of the Seroquel and Ambien as sleep aids.  She wants a medication that will "knock [me] out."  She is trying to have a happier day.  [name] stresses her out and she is drawing some clear lines with her but it creates some challenging feelings.  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  Although [name] is very similar to Ambien, [name] is hopeful that it will give her the relief she wants.  Agreed to attempt to prescribe it for her ([facility] may have barriers).  Otherwise, will keep medications the same.  Dr. Kathrynn Running will call in refills for Lithium and Klonopin in addition to the [facility].  Today [name] presents as hypomanic.  She is sarcastic and impulsive in her speech in a way that is typical for her.  Will followin three months.  Sooner if something arises.  Orders: Therapy 20-30 min- [facility] ([id number])  Complete Medication List: 1)  Advair Diskus 250-50 Mcg/dose Misc (Fluticasone-salmeterol) .... One puff bid 2)  Ventolin Hfa 108 (90 Base) Mcg/act Aers (Albuterol sulfate) .... 2 to 4 puffs every 4 hours as needed for wheezing 3)  Lithium Carbonate 300 Mg Caps (Lithium carbonate) .... Take five capsules a day.  per dr. Kathrynn Running [facility]. 4)  Spiriva Handihaler 18 Mcg Caps (Tiotropium bromide monohydrate) .... One inh daily 5)  Clonazepam 0.5 Mg Tabs (Clonazepam) .... One tab by mouth three times a day as needed.  per dr. Kathrynn Running [facility]. 6)  Seroquel 100 Mg Tabs (Quetiapine fumarate) .... Take one tablet at night.  per dr. Kathrynn Running in [facility]. 7)  Prilosec 20 Mg Cpdr (Omeprazole) .... Take 1 tablet daily 8)  Mobic 7.5 Mg Tabs  (Meloxicam) .... One by mouth daily 9)  Doxycycline Monohydrate 100 Mg Caps (Doxycycline monohydrate) .... One tab by mouth two times a day x 14 days 10)  Ibuprofen 800 Mg Tabs (Ibuprofen) .... One tab by mouth three times a day as needed pain 11)  Lunesta 3 Mg Tabs (Eszopiclone) .... Per dr. Kathrynn Running in [facility].  Patient Instructions: 1)  Please keep your appt for [facility] for 11/21/09 at 11:00. 2)  Dr. Kathrynn Running will refill your medication by calling it into your pharmacy. 3)  It was good to see you today Dorothy Burns. Prescriptions: LUNESTA 3 MG TABS (ESZOPICLONE) Per Dr. Kathrynn Running in [facility].  #30 x 0   Entered and Authorized by:   Spero Geralds PsyD   Signed by:   Spero Geralds PsyD on 08/22/2009   Method used:   Telephoned to ...         RxID:   [id number] CLONAZEPAM 0.5 MG TABS (CLONAZEPAM) One tab by mouth three times a day as needed.  Per Dr. Kathrynn Running in [facility].  #90 x 2   Entered and Authorized by:   Spero Geralds PsyD   Signed by:   Spero Geralds PsyD on 08/22/2009   Method used:   Telephoned to ...         RxID:   [id number] LITHIUM CARBONATE 300 MG  CAPS (LITHIUM CARBONATE) Take five capsules a day.  Per Dr. Kathrynn Running  in [facility].  #150 x 6   Entered and Authorized by:   Spero Geralds PsyD   Signed by:   Spero Geralds PsyD on 08/22/2009   Method used:   Telephoned to ...         RxID:   [id number]

## 2010-08-20 NOTE — Progress Notes (Signed)
Summary: Requests meds  Phone Note Call from Patient   Caller: Patient Call For: [facility] Summary of Call: Dorothy Burns called to say her medications were never called in.  Looked back at [facility].  Dr. Kathrynn Running was going to call them in secondary to needing to do some authorization for the [facility].  I left him a VM to see what happened with this. Will let Faren know when I hear back.  Follow-up for Phone Call        Checked with the pharmacy - they did not have on file.  Dr. Kathrynn Running provided a script plus two refills. Follow-up by: Spero Geralds PsyD,  September 19, 2009 11:44 AM    Prescriptions: CLONAZEPAM 0.5 MG TABS (CLONAZEPAM) One tab by mouth three times a day as needed.  Per Dr. Kathrynn Running in [facility].  #90 x 2   Entered and Authorized by:   Spero Geralds PsyD   Signed by:   Spero Geralds PsyD on 09/19/2009   Method used:   Historical   RxID:   [id number]

## 2010-08-20 NOTE — Assessment & Plan Note (Signed)
Summary: mrsa?df   Vital Signs:  Patient profile:   55 year old female Height:      64 inches Weight:      203 pounds BMI:     34.97 BSA:     1.97 Temp:     98.0 degrees F Pulse rate:   103 / minute BP sitting:   137 / 90  Vitals Entered By: Jone Baseman CMA (August 03, 2009 2:05 PM) CC: boil on L buttock Is Patient Diabetic? No Pain Assessment Patient in pain? yes     Location: left buttock Intensity: 8   Primary Care Provider:  Ellin Mayhew MD  CC:  boil on L buttock.  History of Present Illness: 1) Boil left buttock: Appeared 4-5 days ago; started as small red circle, gradually increased in size and redness. Denies fever, purulence, streaking erythema, emesis, nausea, diarrhea. Seen about 1 month ago with similar complaint of boil elsewhere, boyfriend with recent history of MRSA; she was treated with doxycycline with improvement. Reports that she has OCD and picks at her skin alot then develops boils.   Habits & Providers  Alcohol-Tobacco-Diet     Tobacco Status: current     Tobacco Counseling: to quit use of tobacco products     Cigarette Packs/Day: 0.25  Current Medications (verified): 1)  Advair Diskus 250-50 Mcg/dose Misc (Fluticasone-Salmeterol) .... One Puff Bid 2)  Ventolin Hfa 108 (90 Base) Mcg/act  Aers (Albuterol Sulfate) .... 2 To 4 Puffs Every 4 Hours As Needed For Wheezing 3)  Lithium Carbonate 300 Mg  Caps (Lithium Carbonate) .... Take Five Capsules A Day.  Per Dr. Kathrynn Running in [facility]. 4)  Spiriva Handihaler 18 Mcg  Caps (Tiotropium Bromide Monohydrate) .... One Inh Daily 5)  Clonazepam 0.5 Mg Tabs (Clonazepam) .... One Tab By Mouth Three Times A Day As Needed.  Per Dr. Kathrynn Running in [facility]. 6)  Seroquel 100 Mg Tabs (Quetiapine Fumarate) .... Take One Tablet At Night.  Per Dr. Kathrynn Running in [facility]. 7)  Prilosec 20 Mg Cpdr (Omeprazole) .... Take 1 Tablet Daily 8)  Mobic 7.5 Mg Tabs (Meloxicam) .... One By Mouth  Daily 9)  Doxycycline Monohydrate 100 Mg Caps (Doxycycline Monohydrate) .... One Tab By Mouth Two Times A Day X 14 Days 10)  Ibuprofen 800 Mg Tabs (Ibuprofen) .... One Tab By Mouth Three Times A Day As Needed Pain  Allergies (verified): No Known Drug Allergies  Social History: Packs/Day:  0.25  Physical Exam  General:  obese, appears jittery,  Neck:  no lymphadenopathy   Lungs:  CTAB w/o crackles  Heart:  RRR, no murmurs Pulses:  2 + radial pulses Extremities:  6 cm area of erythema and induration well circumscribed on left lower buttock w/o streaking erythema, fluctuance, discharge, several areas of excoriation on bilateral legs.   Neurologic:  alert & oriented X3, strength normal in all extremities, and sensation intact to light touch.     Impression & Recommendations:  Problem # 1:  CELLULITIS AND ABSCESS OF BUTTOCK (ICD-682.5) Assessment New  Will treat for MRSA given prior history w/ doxycycline x 14 days. Ibuprofen for pain. Advised warm compresses. No fluctuant areas for I+D. Patient demands narcotic pain medication - per her PCP's plan (w/ hx of substance abuse, multiple requests for narcotics). Patient plans to take a relative's vicodin - advised against this. Follow up with PCP in 2 weeks. Red flags reviewed.  Her updated medication list for this problem includes:  Doxycycline Monohydrate 100 Mg Caps (Doxycycline monohydrate) ..... One tab by mouth two times a day x 14 days  Orders: [facility]- Est Level  3 (19147)  Complete Medication List: 1)  Advair Diskus 250-50 Mcg/dose Misc (Fluticasone-salmeterol) .... One puff bid 2)  Ventolin Hfa 108 (90 Base) Mcg/act Aers (Albuterol sulfate) .... 2 to 4 puffs every 4 hours as needed for wheezing 3)  Lithium Carbonate 300 Mg Caps (Lithium carbonate) .... Take five capsules a day.  per dr. Kathrynn Running in [facility]. 4)  Spiriva Handihaler 18 Mcg Caps (Tiotropium bromide monohydrate) .... One inh daily 5)  Clonazepam 0.5 Mg  Tabs (Clonazepam) .... One tab by mouth three times a day as needed.  per dr. Kathrynn Running in [facility]. 6)  Seroquel 100 Mg Tabs (Quetiapine fumarate) .... Take one tablet at night.  per dr. Kathrynn Running in [facility]. 7)  Prilosec 20 Mg Cpdr (Omeprazole) .... Take 1 tablet daily 8)  Mobic 7.5 Mg Tabs (Meloxicam) .... One by mouth daily 9)  Doxycycline Monohydrate 100 Mg Caps (Doxycycline monohydrate) .... One tab by mouth two times a day x 14 days 10)  Ibuprofen 800 Mg Tabs (Ibuprofen) .... One tab by mouth three times a day as needed pain  Patient Instructions: 1)  Take your doxycycline twice a day for 14 days 2)  take Ibuprofen every 6-8 hours as needed for pain 3)  You can apply warm compresses to the area to help with pain and swelling.  4)  Follow up next week if worsening pain and swelling, fevers not relieved by medication.  Prescriptions: IBUPROFEN 800 MG TABS (IBUPROFEN) One tab by mouth three times a day as needed pain  #30 x 0   Entered and Authorized by:   Bobby Rumpf  MD   Signed by:   Bobby Rumpf  MD on 08/03/2009   Method used:   Electronically to        [facility]  [address] [phone]* (retail)       [address]       [place], [place]  [address]       Ph: [phone]       Fax: [phone]   RxID:   [id number] DOXYCYCLINE MONOHYDRATE 100 MG CAPS (DOXYCYCLINE MONOHYDRATE) One tab by mouth two times a day x 14 days  #28 x 0   Entered and Authorized by:   Bobby Rumpf  MD   Signed by:   Bobby Rumpf  MD on 08/03/2009   Method used:   Electronically to        [facility]  [address] [phone]* (retail)       [address]       [place], [place]  [address]       Ph: [phone]       Fax: [phone]   RxID:   [id number]

## 2010-08-20 NOTE — Progress Notes (Signed)
Summary: Request refills  Phone Note Call from Patient   Caller: Patient Call For: Spero Geralds, Psy.D. Summary of Call: Patient left long VM about needing a refill on her Clonazepam and wanting to try a new sleep med.  Attempted to call patient back x 2.  VM picked up but no beep to leave a message.  Will await patient follow-up. Initial call taken by: Spero Geralds PsyD,  August 17, 2009 9:41 AM  Follow-up for Phone Call        Ninah left a VM Friday afternoon.  I returned it today and left a VM.  I asked her to leave times when she might be available. Follow-up by: Spero Geralds PsyD,  August 20, 2009 3:15 PM  Additional Follow-up for Phone Call Additional follow up Details #1::        Finally connected with Saryn today.  [facility].  Will see her then. Additional Follow-up by: Spero Geralds PsyD,  August 21, 2009 5:09 PM

## 2010-08-20 NOTE — Miscellaneous (Signed)
  Clinical Lists Changes  Problems: Removed problem of INJURY OTHER&UNSPECIFIED KNEE LEG ANKLE&FOOT (ICD-959.7) Removed problem of CELLULITIS AND ABSCESS OF BUTTOCK (ICD-682.5) Removed problem of U R I (ICD-465.9) Removed problem of ABDOMINAL PAIN (ICD-789.00) Removed problem of SKIN RASH (ICD-782.1) Removed problem of ENLARGEMENT OF LYMPH NODES (ICD-785.6) Removed problem of HEPATITIS C, HX OF (ICD-V12.09) Removed problem of INSOMNIA UNSPECIFIED (ICD-780.52) Removed problem of SHOULDER PAIN, LEFT (ICD-719.41) Removed problem of HAND PAIN, RIGHT (ICD-729.5) Removed problem of ARM PAIN, LEFT (ICD-729.5)      Allergies: No Known Drug Allergies

## 2010-08-20 NOTE — Progress Notes (Signed)
Summary: Change [facility] appt time  Phone Note Outgoing Call   Call placed by: Spero Geralds PsyD,  October 22, 2009 4:43 PM Call placed to: Patient Summary of Call: Called patient to request change in [facility] app[facility] time for May 4th.  She said she would like to cancel the appt because she is seeing a new doctor - Dr. Nanine Means (sp??) on [address].  He increased her Clonazepam to 1.0 mg and she is happy with that change so will follow with him.  She would like to keep the door open here though and thanked Dr. Kathrynn Running and me for the care we provided.  She will continue to come to [facility] for her other health issues. Initial call taken by: Spero Geralds PsyD,  October 22, 2009 4:44 PM
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
## 2010-08-21 ENCOUNTER — Encounter: Payer: Self-pay | Admitting: Family Medicine

## 2010-08-22 NOTE — Assessment & Plan Note (Signed)
Summary: back pain   Vital Signs:  Patient profile:   55 year old female Height:      64 inches Weight:      187.8 pounds Temp:     97.7 degrees F oral Pulse rate:   73 / minute BP sitting:   124 / 79  (left arm) Cuff size:   regular  Vitals Entered By: Jimmy Footman, CMA (July 17, 2010 11:06 AM) CC: Lower back pain Is Patient Diabetic? No Pain Assessment Patient in pain? yes        Primary Care Provider:  Ellin Mayhew MD  CC:  Lower back pain.  History of Present Illness: back pain: pt states that she continues to have lower back pain that is consistent with the pain that she has had for many years.  Increased burning sensation in lower back muscles last night.  states that she would like to be checked for a uti.  no fever. no urinary retention, frequency, or pain with urination.  no constipation or diarrhea.  no new trauma to back.  no weakness.  no loss of sensation.  she states she needs to have something more for pain or she is going to go to the ER.  She states that tylenol and motrin do not help.  a warm bath helped her pain minimally.  She states that she has tramadol at home and this doesn't do anything for her.  She also states that she has tried nuerotin and this didn't help in past.    Of note: pt states that she has been seening another physician at another clinic.  He has increased her klonopin and she is happy with this.  He is also seeing her for her normal general medical care but she states that she wants to come her for her pelvic exams.  Pt states that she can not remember this physician's name.  it starts with a "g".  Current Medications (verified): 1)  Advair Diskus 250-50 Mcg/dose Misc (Fluticasone-Salmeterol) .... One Puff Bid 2)  Ventolin Hfa 108 (90 Base) Mcg/act  Aers (Albuterol Sulfate) .... 2 To 4 Puffs Every 4 Hours As Needed For Wheezing 3)  Lithium Carbonate 300 Mg  Caps (Lithium Carbonate) .... Take Five Capsules A Day.  [facility]. 4)  Spiriva Handihaler 18 Mcg  Caps (Tiotropium Bromide Monohydrate) .... One Inh Daily 5)  Clonazepam 1 Mg Tabs (Clonazepam) .... One Tab By Mouth Three Times A Day As Needed.  Per Dr[facility]. 6)  Doxycycline Monohydrate 100 Mg Caps (Doxycycline Monohydrate) .... One Tab By Mouth Two Times A Day X 14 Days 7)  Ibuprofen 800 Mg Tabs (Ibuprofen) .... One Tab By Mouth Three Times A Day As Needed Pain 8)  Ambien 5 Mg Tabs (Zolpidem Tartrate) .... Prescribed By Outside Md- Pt Doesn't Know Name  Allergies (verified): No Known Drug Allergies  Social History: Smokes 1/2  ppd.  H/o polysubstance abuse.  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 Lives w/ 3 kids No ETOH, drugs  pt states that she stopped smoking 01/2010  Review of Systems       as per hpi  Physical Exam  General:  VSS Well-developed,well-nourished,in no acute distress; alert,appropriate and cooperative throughout examination Msk:  normal gait. + pain with palp of lower back and upper gluteal muscles.  no swelling, no warmth.  full  rom of lower ext.  negative straight leg raise.  no loss of sensation.  reflexes present and equal bilateral. strength 5/5 and equal bilateral.     Impression & Recommendations:  Problem # 1:  BACK PAIN, LUMBAR (ICD-724.2)  pt continues to have chronic pain.  Pt states that she already has tramadol at home.  Encouraged pt to take tylenol 650mg  by mouth two times a day and tramadol three times a day as needed for breakthrough pain.  pt states that this will not help and that she is going to have to call 911 if it hurts as bad as it did last night.  Will not prescribe narcotics at this time 2/2 concern for addiction history and the fact that this is a chronic long standing back pain issue.  pt states that she is not interested in PT at this time.  encouraged pt to choose which office she would like to see.   It is not a good idea to use 2 seperate office, since we will not be able to see what each other is prescribing.  Requested that pt have other office send records here or we would be happy to send our records to the new office if she would provide [place] with a name of the doctor and practice.    Orders: [facility]- Est Level  3 (16109)  Complete Medication List: 1)  Advair Diskus 250-50 Mcg/dose Misc (Fluticasone-salmeterol) .... One puff bid 2)  Ventolin Hfa 108 (90 Base) Mcg/act Aers (Albuterol sulfate) .... 2 to 4 puffs every 4 hours as needed for wheezing 3)  Lithium Carbonate 300 Mg Caps (Lithium carbonate) .... Take five capsules a day.  [facility] dr[facility]nn [facility]. 4)  Spiriva Handihaler 18 Mcg Caps (Tiotropium bromide monohydrate) .... One inh daily 5)  Clonazepam 1 Mg Tabs (clonazepam)  .... One tab by mouth three times a day as needed.  per dr[facility] Kathrynn [facility]. 6)  Doxycycline Monohydrate 100 Mg Caps (Doxycycline monohydrate) .... One tab by mouth two times a day x 14 days 7)  Ibuprofen 800 Mg Tabs (Ibuprofen) .... One tab by mouth three times a day as needed pain 8)  Ambien 5 Mg Tabs (Zolpidem tartrate) .... Prescribed by outside md- pt doesn't know name  Other Orders: Urinalysis-[facility] (00000)   Orders Added: 1)  Urinalysis-[facility] [00000] 2)  [facility]- Est Level  3 [99213]    Laboratory Results   Urine Tests  Date/Time Received: July 17, 2010 11:15 AM  Date/Time Reported: July 17, 2010 11:25 AM   Routine Urinalysis   Color: yellow Appearance: Clear Glucose: negative   (Normal Range: Negative) Bilirubin: negative   (Normal Range: Negative) Ketone: negative   (Normal Range: Negative) Spec. Gravity: 1.015   (Normal Range: 1.003-1.035) Blood: negative   (Normal Range: Negative) pH: 7.0   (Normal Range: 5.0-8.0) Protein: negative   (Normal Range: Negative) Urobilinogen: 0.2   (Normal Range: 0-1) Nitrite: negative   (Normal Range: Negative) Leukocyte  Esterace: negative   (Normal Range: Negative)    Comments: .........Marland Kitchenbiochemical negative; microscopic not indicated ...............test performed by......Marland KitchenBonnie A. [place], MLS (ASCP)cm

## 2010-08-22 NOTE — Assessment & Plan Note (Signed)
Summary: cervical cancer screening   Vital Signs:  Patient profile:   55 year old female Height:      64 inches Weight:      184.8 pounds BMI:     31.84 Temp:     98.1 degrees F oral Pulse rate:   71 / minute BP sitting:   137 / 86  (left arm) Cuff size:   regular  Vitals Entered By: Jimmy Footman, CMA (July 29, 2010 2:47 PM) CC: cpe Is Patient Diabetic? No   Primary Care Provider:  Ellin Mayhew MD  CC:  cpe.  History of Present Illness: cervical cancer screening: pt. came to [facility] today for a pap smear-since she prefered a female physican for this exam.  She normally sees Dr. Mikeal Hawthorne telephone number [phone].  Pt also asks to be screened for std's since she thinks her boyfriend may be cheating on her.  She wants to do all lab work at her pcp's office.   Habits & Providers  Alcohol-Tobacco-Diet     Tobacco Status: quit  Current Medications (verified): 1)  Advair Diskus 250-50 Mcg/dose Misc (Fluticasone-Salmeterol) .... One Puff Bid 2)  Ventolin Hfa 108 (90 Base) Mcg/act  Aers (Albuterol Sulfate) .... 2 To 4 Puffs Every 4 Hours As Needed For Wheezing 3)  Lithium Carbonate 300 Mg  Caps (Lithium Carbonate) .... Take Five Capsules A Day.  [facility]. 4)  Spiriva Handihaler 18 Mcg  Caps (Tiotropium Bromide Monohydrate) .... One Inh Daily 5)  Clonazepam 1 Mg Tabs (Clonazepam) .... One Tab By Mouth Three Times A Day As Needed.  Per Dr[facility] Kath[facility]nn [facility]. 6)  Doxycycline Monohydrate 100 Mg Caps (Doxycycline Monohydrate) .... One Tab By Mouth Two Times A Day X 14 Days 7)  Ibuprofen 800 Mg Tabs (Ibuprofen) .... One Tab By Mouth Three Times A Day As Needed Pain 8)  Ambien 5 Mg Tabs (Zolpidem Tartrate) .... Prescribed By Outside Md- Pt Doesn't Know Name  Allergies (verified): No Known Drug Allergies  Social History: Smoking Status:  quit  Review of Systems  The patient denies anorexia, fever, weight loss,  vision loss, chest pain, and syncope.    Physical Exam  General:  VSS Well-developed,well-nourished,in no acute distress; alert,appropriate and cooperative throughout examination Breasts:  No mass, nodules, thickening, tenderness, bulging, retraction, inflamation, nipple discharge or skin changes noted.   Lungs:  Normal respiratory effort, chest expands symmetrically.  Heart:  Normal rate and regular rhythm.  Abdomen:  Bowel sounds positive,abdomen soft and non-tender without masses, organomegaly or hernias noted. Genitalia:  Pelvic Exam:        External: normal female genitalia without lesions or masses        Vagina: normal without lesions or masses        Cervix: normal without lesions or masses        Adnexa: normal bimanual exam without masses or fullness        Uterus: normal by palpation        Pap smear: performed Extremities:  no edema Skin:  Intact without suspicious lesions or rashes Psych:  Oriented X3, memory intact for recent and remote, normally interactive, good eye contact, and not anxious appearing.  appeared unkept.   Impression & Recommendations:  Problem # 1:  SCREENING FOR MALIGNANT NEOPLASM OF THE CERVIX (ICD-V76.2) I will forward the results to Dr. Mikeal Hawthorne telephone number [phone]- address [address], [place], [place] [address] you when  they come back.  On review of her records I see that she is in need of a colonoscopy, mammogram, and lab work to check renal function and lipids.  She states that she would prefer to follow up on these tests with Dr Mikeal Hawthorne.  Also, pt wants to do all lab work at her pcp- recommed that pt also be screened for HIV and RPR when lab work is drawn.  When I call pt with results of pap and GC/Chlam I will remind her of the importance of the HIV and RPR screening.  Orders: Pap Smear-[facility] ([id number]) [facility] - Est  40-64 yrs ([id number])  Complete Medication List: 1)  Advair Diskus 250-50 Mcg/dose Misc (Fluticasone-salmeterol) .... One puff  bid 2)  Ventolin Hfa 108 (90 Base) Mcg/act Aers (Albuterol sulfate) .... 2 to 4 puffs every 4 hours as needed for wheezing 3)  Lithium Carbonate 300 Mg Caps (Lithium carbonate) .... Take five capsules a day.  per dr[facility] Kath[facility]nn [facility]. 4)  Spiriva Handihaler 18 Mcg Caps (Tiotropium bromide monohydrate) .... One inh daily 5)  Clonazepam 1 Mg Tabs (clonazepam)  .... One tab by mouth three times a day as needed.  per dr. Kathrynn Running [facility]. 6)  Doxycycline Monohydrate 100 Mg Caps (Doxycycline monohydrate) .... One tab by mouth two times a day x 14 days 7)  Ibuprofen 800 Mg Tabs (Ibuprofen) .... One tab by mouth three times a day as needed pain 8)  Ambien 5 Mg Tabs (Zolpidem tartrate) .... Prescribed by outside md- pt doesn't know name  Other Orders: GC/Chlamydia-[facility] ([id number])  Patient Instructions: 1)  Dr Mikeal Hawthorne: 2)  Your patient came to [facility] today for a pap smear-since she prefered a female physican for this exam.  I will forward the results to you when they come back.  On review of her records I see that she is in need of a colonoscopy, mammogram, and lab work to check renal function and lipids.  She states that she would prefer to follow up on these tests with you.  If you need any other info from me please give our office a call.    Orders Added: 1)  Pap Smear-[facility] [[id number]] 2)  GC/Chlamydia-[facility] [[id number]] 3)  [facility] - Est  40-64 yrs [[id number]]     Prevention & Chronic Care Immunizations   Influenza vaccine: Fluvax Non-MCR  (07/03/2008)   Influenza vaccine due: 04/28/2009    Tetanus booster: 08/06/2009: Tdap   Tetanus booster due: 01/19/2011    Pneumococcal vaccine: Not documented  Colorectal Screening   Hemoccult: Not documented    Colonoscopy: Not documented   Colonoscopy action/deferral: Refused  (07/29/2010)  Other Screening   Pap smear: normal  (04/28/2008)   Pap smear due: 04/28/2009    Mammogram: normal   (02/22/2007)   Mammogram due: 02/22/2008   Smoking status: quit  (07/29/2010)  Lipids   Total Cholesterol: 139  (10/13/2007)   LDL: 74  (10/13/2007)   LDL Direct: 86  (04/28/2008)   HDL: 40  (10/13/2007)   Triglycerides: 127  (10/13/2007)  Hypertension   Last Blood Pressure: 137 / 86  (07/29/2010)   Serum creatinine: 0.74  (04/28/2008)   Serum potassium 4.5  (04/28/2008)  Self-Management Support :   Personal Goals (by the next clinic visit) :      Personal blood pressure goal: 140/90  (06/08/2009)   Patient will work on the following items until the next clinic visit to reach self-care goals:  Medications and monitoring: take my medicines every day  (06/08/2009)     Eating: drink diet soda or water instead of juice or soda, eat more vegetables, use fresh or frozen vegetables, eat foods that are low in salt, eat baked foods instead of fried foods, eat fruit for snacks and desserts  (06/08/2009)    Hypertension self-management support: Written self-care plan  (06/08/2009)

## 2010-08-22 NOTE — Progress Notes (Signed)
  Phone Note Outgoing Call   Call placed by: Ellin Mayhew, MD Call placed to: Dorothy Burns Summary of Call: called Dorothy Burns to review pap smear and GC/Chlam results with her.  Reminded her to get her bloodwork drawn as we discussed at Dr. Maxwell Caul office- and to not to forget to ask him to check for HIV and syphilis, which we did not do Dorothy Burns had requested all blood work to be drawn at her PCP's office.  I told the Dorothy Burns that i will have the office visit as well as lab results faxed to her pcp's office.

## 2010-10-06 LAB — BASIC METABOLIC PANEL
BUN: 8 mg/dL (ref 6–23)
Calcium: 9.3 mg/dL (ref 8.4–10.5)
GFR calc non Af Amer: >60 mL/min (ref >60)
Glucose, Bld: 98 mg/dL (ref 70–99)
Potassium: 3.7 mEq/L (ref 3.5–5.1)

## 2010-10-06 LAB — WOUND CULTURE

## 2010-10-06 LAB — DIFFERENTIAL
Basophils Absolute: 0 10*3/uL (ref 0.0–0.1)
Basophils Relative: 0 % (ref 0–1)
Lymphocytes Relative: 19 % (ref 12–46)
Neutro Abs: 3.9 10*3/uL (ref 1.7–7.7)
Neutrophils Relative %: 68 % (ref 43–77)

## 2010-10-06 LAB — GLUCOSE, CAPILLARY: Glucose-Capillary: 117 mg/dL — ABNORMAL HIGH (ref 70–99)

## 2010-10-06 LAB — CBC
MCHC: 32 g/dL (ref 30.0–36.0)
Platelets: 194 10*3/uL (ref 150–400)
RDW: 17.8 % — ABNORMAL HIGH (ref 11.5–15.5)
WBC: 5.8 10*3/uL (ref 4.0–10.5)

## 2010-10-22 ENCOUNTER — Inpatient Hospital Stay (INDEPENDENT_AMBULATORY_CARE_PROVIDER_SITE_OTHER)
Admission: RE | Admit: 2010-10-22 | Discharge: 2010-10-22 | Disposition: A | Discharge status: Home / Self Care | Payer: Medicare Other | Source: Ambulatory Visit | Attending: Family Medicine | Admitting: Family Medicine

## 2010-10-22 DIAGNOSIS — F410 Panic disorder [episodic paroxysmal anxiety] without agoraphobia: Secondary | ICD-10-CM

## 2010-10-24 ENCOUNTER — Other Ambulatory Visit: Payer: Self-pay | Admitting: Family Medicine

## 2010-10-24 NOTE — Telephone Encounter (Signed)
refill request for patient of Dr. Edmonia James.

## 2010-10-28 NOTE — Telephone Encounter (Signed)
Refill request

## 2010-10-29 NOTE — Telephone Encounter (Signed)
Refill request

## 2010-12-06 NOTE — Discharge Summary (Signed)
NAMESHAHED, YEOMAN               ACCOUNT NO.:  [id number]   MEDICAL RECORD NO.:  [id number]          PATIENT TYPE:  IPS   LOCATION:  0500                          FACILITY:  [facility]   PHYSICIAN:  Anselm Jungling, MD  DATE OF BIRTH:  1956/07/16   DATE OF ADMISSION:  08/17/2005  DATE OF DISCHARGE:  08/23/2005                                 DISCHARGE SUMMARY   IDENTIFYING DATA AND REASON FOR ADMISSION:  This was the first [facility] admission  for Dorothy Burns, a 55 year old Caucasian female with bipolar disorder admitted  primarily for treatment of crack cocaine dependence, and exacerbation of her  depression. She came to [place] on a regimen of lithium carbonate 300 mg four  times daily.   She had also had a history of intravenous drug abuse, and relapsed on  cocaine approximately 2 years prior to admission, with weekly use escalating  to daily use during the prior 2 weeks. Her exacerbation of depression  involved daily suicidal thoughts. She also indicated a significant degree of  anxiety and agitation. Please refer to the admission note for further  details pertaining to the symptoms, circumstances and history that led to  her hospitalization.   MEDICAL AND LABORATORY:  The patient came to [place] with a history of bronchitis  and COPD, and hepatitis C. She had been taking Advair and albuterol  inhalers, and was on course of doxycycline 100 mg b.i.d. times 7 days which  had begun on August 12, 2005. This was for an acute exacerbation of her  COPD. She also came to [place] with a history of macrocytic anemia. There were no  significant medical issues during this brief inpatient psychiatric stay.   HOSPITAL COURSE:  The patient was admitted to the adult inpatient  psychiatric service. She was placed on a cocaine withdrawal protocol and  started on Symmetrel 100 mg b.i.d. to address cocaine craving. She was  continued on lithium carbonate 300 mg four times daily. Trazodone 100 mg  q.h.s. was utilized for  insomnia.   To further address internal anxiety and agitation, and insomnia, the patient  was started on a regimen of low dose Risperdal, which was gradually  increased to a final dose of 0.25 mg b.i.d. and 1 mg q.h.s..   The patient was tired, depleted, depressed, and spent much of the first few  days of her treatment in bed. She also had a significant degree of nausea  and required p.r.n. doses of Phenergan to address this. She had difficulty  keeping food down. However, this improved gradually over her inpatient stay.   During final 2-3 days of her hospital stay, she was up, dressed, and more  active in unit groups and activities. Her sleep, appetite and p.o. intake  all improved. She was absent suicidal ideation and her mood brightened  significantly. By the eighth hospital day, the patient felt ready for  discharge and was able to state clearly and convincingly that she was absent  any suicidal thoughts or thoughts of self-harm. She felt that she was  strongly committed to continue abstinence from cocaine.   AFTERCARE:  The patient is to follow-up at the [facility]  on August 26, 2005 with Dr. Lang Snow.   DISCHARGE MEDICATIONS:  Symmetrel 100 mg b.i.d., lithium carbonate 300 mg  q.i.d., trazodone 100 mg q.h.s., Risperdal 0.25 mg b.i.d. and 1 mg q.h.s.,  and doxycycline 100 mg b.i.d. for 5 days more.   DISCHARGE DIAGNOSES:  AXIS I: Bipolar disorder by history, cocaine abuse,  early remission.  AXIS II: Deferred.  AXIS III: History of chronic obstructive pulmonary disease, apparent  gastroesophageal reflux disease, history of macrocytic anemia.  AXIS IV: Stressors severe.  AXIS V: Global assessment of functioning on discharge 65.           ______________________________  Anselm Jungling, MD  Electronically Signed     SPB/MEDQ  D:  08/25/2005  T:  08/25/2005  Job:  [id number]
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
## 2010-12-13 ENCOUNTER — Emergency Department (HOSPITAL_COMMUNITY)
Admission: EM | Admit: 2010-12-13 | Discharge: 2010-12-13 | Discharge status: Against Medical Advice | Payer: Medicare Other | Attending: Emergency Medicine | Admitting: Emergency Medicine

## 2010-12-13 ENCOUNTER — Ambulatory Visit (HOSPITAL_COMMUNITY)
Admission: RE | Admit: 2010-12-13 | Discharge: 2010-12-13 | Disposition: A | Discharge status: Home / Self Care | Payer: Medicare Other | Attending: Psychiatry | Admitting: Psychiatry

## 2010-12-13 DIAGNOSIS — F319 Bipolar disorder, unspecified: Secondary | ICD-10-CM | POA: Insufficient documentation

## 2010-12-13 DIAGNOSIS — Z139 Encounter for screening, unspecified: Secondary | ICD-10-CM | POA: Insufficient documentation

## 2010-12-13 LAB — DIFFERENTIAL
Basophils Absolute: 0.1 10*3/uL (ref 0.0–0.1)
Lymphocytes Relative: 24 % (ref 12–46)
Lymphs Abs: 2 10*3/uL (ref 0.7–4.0)
Monocytes Absolute: 0.9 10*3/uL (ref 0.1–1.0)
Neutro Abs: 4.8 10*3/uL (ref 1.7–7.7)

## 2010-12-13 LAB — CBC
HCT: 35.3 % — ABNORMAL LOW (ref 36.0–46.0)
Hemoglobin: 11 g/dL — ABNORMAL LOW (ref 12.0–15.0)
WBC: 8 10*3/uL (ref 4.0–10.5)

## 2010-12-13 LAB — URINALYSIS, ROUTINE W REFLEX MICROSCOPIC
Hgb urine dipstick: NEGATIVE
Nitrite: NEGATIVE
Protein, ur: NEGATIVE mg/dL
Specific Gravity, Urine: 1.007 (ref 1.005–1.030)
Urobilinogen, UA: 1 mg/dL (ref 0.0–1.0)

## 2010-12-13 LAB — RAPID URINE DRUG SCREEN, HOSP PERFORMED
Amphetamines: NOT DETECTED
Cocaine: POSITIVE — AB
Opiates: NOT DETECTED
Tetrahydrocannabinol: NOT DETECTED

## 2010-12-13 LAB — COMPREHENSIVE METABOLIC PANEL
AST: 60 U/L — ABNORMAL HIGH (ref 0–37)
CO2: 26 mEq/L (ref 19–32)
Calcium: 10 mg/dL (ref 8.4–10.5)
Creatinine, Ser: 0.95 mg/dL (ref 0.4–1.2)
GFR calc Af Amer: >60 mL/min (ref >60)
GFR calc non Af Amer: >60 mL/min (ref >60)
Total Protein: 8.2 g/dL (ref 6.0–8.3)

## 2010-12-13 LAB — ETHANOL: Alcohol, Ethyl (B): <11 mg/dL — ABNORMAL HIGH (ref 0–10)

## 2011-05-07 ENCOUNTER — Inpatient Hospital Stay (INDEPENDENT_AMBULATORY_CARE_PROVIDER_SITE_OTHER)
Admission: RE | Admit: 2011-05-07 | Discharge: 2011-05-07 | Disposition: A | Discharge status: Home / Self Care | Payer: Medicare Other | Source: Ambulatory Visit | Attending: Family Medicine | Admitting: Family Medicine

## 2011-05-07 DIAGNOSIS — J40 Bronchitis, not specified as acute or chronic: Secondary | ICD-10-CM

## 2011-05-13 ENCOUNTER — Ambulatory Visit (INDEPENDENT_AMBULATORY_CARE_PROVIDER_SITE_OTHER): Payer: Medicare Other | Admitting: Family Medicine

## 2011-05-13 ENCOUNTER — Encounter: Payer: Self-pay | Admitting: Family Medicine

## 2011-05-13 DIAGNOSIS — J40 Bronchitis, not specified as acute or chronic: Secondary | ICD-10-CM

## 2011-05-13 DIAGNOSIS — N939 Abnormal uterine and vaginal bleeding, unspecified: Secondary | ICD-10-CM

## 2011-05-13 DIAGNOSIS — N898 Other specified noninflammatory disorders of vagina: Secondary | ICD-10-CM

## 2011-05-13 NOTE — Progress Notes (Signed)
  Subjective:    Patient ID: Dorothy Burns, female    DOB: 12-29-1955, 55 y.o.   MRN: [id number]  HPI Bronchitis: Patient states that she was seen in urgent care one week ago. Diagnosed with bronchitis. Had difficulty breathing, coughing up Carreker sputum, difficulty sleeping due to cough, no fever, no chest pain, was prescribed antibiotic x5 days and oral steroids. Continues to cough at nighttime but is much improved overall.  Vaginal bleeding: Last month had vaginal bleeding x3-1/2 weeks. Have used tampons. Flow similar to that of a menstrual period. Has not had a period in 2-3 years. No further bleeding during the past 2 weeks. No abdominal pain. No other vaginal discharge. Told Dr. at urgent care about this and was told to followup with primary Dr. Patient primary Dr. is Dr. Orlean Patten, he is a female doctor and patient did not want to be seen by him for this complaint, so decided to come here to see a female physician. No dizziness. No syncope.   Review of Systems As per above.    Objective:   Physical Exam  Constitutional: She is oriented to person, place, and time. She appears well-developed and well-nourished.  HENT:  Head: Normocephalic and atraumatic.  Cardiovascular: Normal rate, regular rhythm and normal heart sounds.   No murmur heard. Pulmonary/Chest: Effort normal. No respiratory distress. She has wheezes (just a few scattered wheezes bilateral, good air movement to bases). She exhibits no tenderness.  Abdominal: Soft. She exhibits no distension.  Musculoskeletal: She exhibits no edema.  Neurological: She is alert and oriented to person, place, and time.  Skin: No rash noted.  Psychiatric: She has a normal mood and affect. Her behavior is normal.          Assessment & Plan:

## 2011-05-21 DIAGNOSIS — J40 Bronchitis, not specified as acute or chronic: Secondary | ICD-10-CM | POA: Insufficient documentation

## 2011-05-21 DIAGNOSIS — N950 Postmenopausal bleeding: Secondary | ICD-10-CM | POA: Insufficient documentation

## 2011-05-21 NOTE — Assessment & Plan Note (Signed)
Pt here for work in appt today for f/up on bronchitis and brought up this issue as well.  Pt to monitor bleeding and  return for pelvic exam asap.  And then after a thorough physical exam will move forward with needed tests. Pt will most likely need to have u/s to look for any source of bleeding (such as a fibroid), and most likely will need a uterine biopsy.  Per pt no active bleeding at this time.

## 2011-05-21 NOTE — Assessment & Plan Note (Signed)
Pt to finish course of antibiotics and steroids as prescribed by urgent care md.

## 2011-06-10 ENCOUNTER — Encounter: Payer: Self-pay | Admitting: Family Medicine

## 2011-06-10 ENCOUNTER — Ambulatory Visit (INDEPENDENT_AMBULATORY_CARE_PROVIDER_SITE_OTHER): Payer: Medicare Other | Admitting: Family Medicine

## 2011-06-10 DIAGNOSIS — Z7251 High risk heterosexual behavior: Secondary | ICD-10-CM

## 2011-06-10 DIAGNOSIS — Z202 Contact with and (suspected) exposure to infections with a predominantly sexual mode of transmission: Secondary | ICD-10-CM

## 2011-06-10 DIAGNOSIS — N760 Acute vaginitis: Secondary | ICD-10-CM

## 2011-06-10 DIAGNOSIS — Z20828 Contact with and (suspected) exposure to other viral communicable diseases: Secondary | ICD-10-CM

## 2011-06-10 DIAGNOSIS — R300 Dysuria: Secondary | ICD-10-CM

## 2011-06-10 DIAGNOSIS — N898 Other specified noninflammatory disorders of vagina: Secondary | ICD-10-CM

## 2011-06-10 DIAGNOSIS — N939 Abnormal uterine and vaginal bleeding, unspecified: Secondary | ICD-10-CM

## 2011-06-10 LAB — POCT WET PREP (WET MOUNT)
Trichomonas Wet Prep HPF POC: NEGATIVE
Yeast Wet Prep HPF POC: NEGATIVE

## 2011-06-11 ENCOUNTER — Other Ambulatory Visit: Payer: Self-pay | Admitting: Family Medicine

## 2011-06-14 NOTE — Assessment & Plan Note (Addendum)
Abnormal posterior pausal bleeding. Obtained endometrial biopsy today-results pending. No friability or bleeding noted in vaginal tissue. Will order her pelvic ultrasound to assess endometrial stripe as well as look for fibroids or other anatomical abnormality that could be causing bleeding.

## 2011-06-14 NOTE — Progress Notes (Signed)
  Subjective:    Patient ID: Dorothy Burns, female    DOB: [birth year], 55 y.o.   MRN: [id number]  HPI Normal vaginal bleeding: As per last office note-patient reports an episode of vaginal bleeding that lasted for 3-1/2 weeks. This was approximately one month ago. Has not had any bleeding since. Prior to this episode of bleeding has not had a period for 2-3 years. Patient denies any abdominal pain. No fever. No problems with urination. No problems with bowels. Does endorse foul vaginal odor. No vaginal discharge.   Sexual exposure: Does endorse she is sexually active with one partner. Would like to be checked for sexually transmitted infections since she does not use condoms with her partner. Is not confident that her partner is monogamous.  Review of Systems As per above.    Objective:   Physical Exam  Constitutional: She is oriented to person, place, and time. She appears well-developed and well-nourished.  HENT:  Head: Normocephalic and atraumatic.  Cardiovascular: Normal rate.   Pulmonary/Chest: Effort normal. No respiratory distress.  Genitourinary: Vagina normal and uterus normal. There is no rash, tenderness, lesion or injury on the right labia. There is no rash, tenderness, lesion or injury on the left labia. Cervix exhibits no motion tenderness, no discharge and no friability. Right adnexum displays no mass, no tenderness and no fullness. Left adnexum displays no mass, no tenderness and no fullness.  Musculoskeletal: She exhibits no edema.  Neurological: She is alert and oriented to person, place, and time.  Skin: No rash noted.   Endometrial biopsy: Risk and benefits explained.  Consent signed.  Time out performed.  Speculum inserted, cervix cleansed with betadine,  Tenaculum placed on anterior cervix, passage of uterine sound to 7 cm. Pipelle then was successfully passed to 7 cm with moderate sized/bloody specimen obtained and placed in formalin. Tenaculum removed. Good  hemostasis achieved.  Pt tolerated procedure well with minimal discomfort.   .      Assessment & Plan:

## 2011-06-14 NOTE — Assessment & Plan Note (Signed)
Wet prep and GC Chlamydia obtained. Also screened for HIV and syphilis. Per patient request.

## 2011-06-16 ENCOUNTER — Telehealth: Payer: Self-pay | Admitting: *Deleted

## 2011-06-16 NOTE — Telephone Encounter (Signed)
Called pt to return call.  Need to sched. Pelvic US. Lorenda Hatchet, Renato Battles

## 2011-06-16 NOTE — Telephone Encounter (Signed)
Message copied by Arlyss Repress on Mon Jun 16, 2011  9:09 AM ------      Message from: Kristen Cardinal      Created: Sat Jun 14, 2011 11:04 AM       Please schedule pt for pelvic ultrasound. Order placed in epic.

## 2011-06-16 NOTE — Telephone Encounter (Signed)
Told pt about pelvic US. Pt wants to find out results of endometrial biopsy first. Also wants to know, if the [place] is really necessary. She has no problems at this time. Fwd. To DR.Caviness for review. Lorenda Hatchet, Renato Battles

## 2011-06-17 NOTE — Telephone Encounter (Signed)
Pt is worried about this test and wants to talk to [facility] about this.  She was adamant that she talk to doctor asap.

## 2011-06-18 ENCOUNTER — Telehealth: Payer: Self-pay | Admitting: *Deleted

## 2011-06-18 NOTE — Telephone Encounter (Signed)
Called and [facility] [place] [facility] at 2 pm. Patient was instructed to drink 32 oz of water before exam.Valiant Dills, Rodena Medin

## 2011-06-18 NOTE — Telephone Encounter (Signed)
Spoke with pt. Reviewed results of endometrial biopsy. Pt states understanding.  Agrees to go for pelvic ultrasound.

## 2011-06-18 NOTE — Telephone Encounter (Signed)
Called patient to discuss results.  No answer.  Left message.  Asked pt to call and leave message with office staff telling me the best number to call her at as well as the best time to call.

## 2011-06-18 NOTE — Progress Notes (Signed)
Addended by: Jennette Bill on: 06/18/2011 03:04 PM   Modules accepted: Orders

## 2011-06-20 ENCOUNTER — Ambulatory Visit (HOSPITAL_COMMUNITY)
Admission: RE | Admit: 2011-06-20 | Discharge: 2011-06-20 | Disposition: A | Discharge status: Home / Self Care | Payer: Medicare Other | Source: Ambulatory Visit | Attending: Family Medicine | Admitting: Family Medicine

## 2011-06-20 DIAGNOSIS — R9389 Abnormal findings on diagnostic imaging of other specified body structures: Secondary | ICD-10-CM | POA: Insufficient documentation

## 2011-06-20 DIAGNOSIS — N939 Abnormal uterine and vaginal bleeding, unspecified: Secondary | ICD-10-CM

## 2011-06-20 DIAGNOSIS — N950 Postmenopausal bleeding: Secondary | ICD-10-CM | POA: Insufficient documentation

## 2011-06-23 ENCOUNTER — Telehealth: Payer: Self-pay | Admitting: Family Medicine

## 2011-06-23 NOTE — Telephone Encounter (Signed)
Please call patient back asap

## 2011-06-25 ENCOUNTER — Other Ambulatory Visit: Payer: Self-pay | Admitting: Family Medicine

## 2011-06-25 DIAGNOSIS — N939 Abnormal uterine and vaginal bleeding, unspecified: Secondary | ICD-10-CM

## 2011-06-25 NOTE — Telephone Encounter (Signed)
Attempt to call pt to review results.  No answer.  Left message asking pt to call and let me know what time and what number is best to reach her at.

## 2011-06-25 NOTE — Telephone Encounter (Signed)
Spoke with pt regarding results and my recommendation of referral to OB/GYN for 2nd opinion.  Pt agrees with this plan.  Referral to OB/GYN placed.

## 2011-06-25 NOTE — Telephone Encounter (Signed)
Call back to pt at [phone].

## 2011-07-09 ENCOUNTER — Telehealth: Payer: Self-pay | Admitting: Family Medicine

## 2011-07-09 NOTE — Telephone Encounter (Signed)
Pt calling again, wants to speak with Dr. Edmonia James, she called [facility] and they do not have her scheduled, pt says Dr. Edmonia James told her to go to [facility] for an appt.

## 2011-07-09 NOTE — Telephone Encounter (Signed)
Called patient, she says she is unsure of her appointment or what it is for. I told her she needs to call [facility] and ask them, I do not see any upcoming appointments in the system.

## 2011-07-09 NOTE — Telephone Encounter (Signed)
Pt asking to speak with Dr. Edmonia James, says someone called her and told her to be at [facility] on 1/20 at 9 am and she doesn't know why.

## 2011-07-09 NOTE — Telephone Encounter (Signed)
Will forward to [facility], I do not see anything in the notes about patient being sent to [facility] or any upcoming appointments in the system.Busick, Rodena Medin

## 2011-07-09 NOTE — Telephone Encounter (Signed)
Called patient back and informed her that she has an appointment at [facility] hospital on 07/29/10 at 1 pm.Deborh Pense, Rodena Medin

## 2011-07-09 NOTE — Telephone Encounter (Signed)
I did refer pt to the OB/GYN clinic for consult for postmenopausal vaginal bleeding.  [facility]  When is her appt with OB/GYN ?

## 2011-07-30 ENCOUNTER — Encounter: Payer: Self-pay | Admitting: Obstetrics and Gynecology

## 2011-07-30 ENCOUNTER — Ambulatory Visit (INDEPENDENT_AMBULATORY_CARE_PROVIDER_SITE_OTHER): Payer: Medicare Other | Admitting: Obstetrics and Gynecology

## 2011-07-30 VITALS — BP 139/95 | HR 66 | Temp 96.9°F | Resp 20 | Ht 63.0 in | Wt 175.9 lb

## 2011-07-30 DIAGNOSIS — N950 Postmenopausal bleeding: Secondary | ICD-10-CM

## 2011-07-30 DIAGNOSIS — F319 Bipolar disorder, unspecified: Secondary | ICD-10-CM

## 2011-07-30 NOTE — Progress Notes (Signed)
Dorothy Burns [id number] @Unknown  Chief Complaint  Patient presents with  . Referral    from [facility], vaginal bleeding, had endo Bx on 06/13/11    SUBJECTIVE  HPI: Patient is brought in by her daughter for evaluation of postmenopausal bleeding episode. Patient states she's been menopausal since age 56. In mid September she began bleeding vaginally and not to wear a tampon every day for over 3 weeks. Ever had any other bleeding episode. Denies trauma or antecedent intercourse she's was evaluated at [facility] by Dr. Edmonia James and on 06/13/2011 at endometrial biopsy. Report showed superficial fragments of early secret Tory endometrium with much blood. There is no glandular or stromal component therefore difficult to evaluate for hyperplasia. On 06/20/2011 she had a pelvic ultrasound with endometrial stripe 8 mm otherwise normal. She had a negative Pap in October 2012. She is bipolar on lithium and also has panic attacks. She had drug screen positive for cocaine several months ago. She is very distressed at this visit saying she lost her wallet and is anxious to go look for it.   Past Medical History  Diagnosis Date  . Hypertension   . Bipolar 1 disorder   . Menorrhagia   . Post-menopausal bleeding   . Paresthesia   . COPD (chronic obstructive pulmonary disease)   . H/O: substance abuse    Past Surgical History  Procedure Date  . Tubal ligation   . Facial reconstruction surgery   . Multiple tooth extractions    History   Social History  . Marital Status: Single    Spouse Name: N/A    Number of Children: N/A  . Years of Education: N/A   Occupational History  . Not on file.   Social History Main Topics  . Smoking status: Current Some Day Smoker -- 0.2 packs/day for 30 years    Types: Cigarettes  . Smokeless tobacco: Not on file   Comment: in process of quitting---would like nicotine patch if insurance covers it  . Alcohol Use: No     recovering alcoholic  . Drug  Use: Yes    Special: "Crack" cocaine, Marijuana, Cocaine     alcohol  . Sexually Active: Not Currently    Birth Control/ Protection: Post-menopausal   Other Topics Concern  . Not on file   Social History Narrative  . No narrative on file   Current Outpatient Prescriptions on File Prior to Visit  Medication Sig Dispense Refill  . ADVAIR DISKUS 250-50 MCG/DOSE AEPB INHALE 1 PUFF TWICE A DAY  1 each  3  . albuterol (VENTOLIN HFA) 108 (90 BASE) MCG/ACT inhaler Inhale 2-4 puffs into the lungs every 4 (four) hours as needed. For wheezing       . clonazePAM (KLONOPIN) 1 MG tablet Take 1 mg by mouth 3 (three) times daily as needed. Per Dr Kathrynn Running in mood disorder clinic.       Marland Kitchen lithium 300 MG capsule Take five capsules a day.  Per Dr. Kathrynn Running in [facility].      Marland Kitchen zolpidem (AMBIEN) 5 MG tablet Prescribes by another clinic       . doxycycline (VIBRAMYCIN) 100 MG capsule Take 100 mg by mouth 2 (two) times daily. For 14 days      . ibuprofen (ADVIL,MOTRIN) 800 MG tablet Take 800 mg by mouth 3 (three) times daily as needed. For pain       . tiotropium (SPIRIVA HANDIHALER) 18 MCG inhalation capsule Place 18 mcg into inhaler and  inhale daily.         No Known Allergies  ROS: Pertinent items in HPI  OBJECTIVE  BP 139/95  Pulse 66  Temp(Src) 96.9 F (36.1 C) (Oral)  Resp 20  Ht 5\' 3"  (1.6 m)  Wt 175 lb 14.4 oz (79.788 kg)  BMI 31.16 kg/m2  LMP 04/29/2011  Physical Exam  Constitutional: She is oriented to person, place, and time. She appears distressed.  HENT:  Head: Normocephalic.  Neck: Neck supple.  Cardiovascular: Normal rate.   Pulmonary/Chest: Effort normal.  Abdominal: Soft. There is no tenderness.  Genitourinary: Vagina normal, uterus normal, cervix normal, right adnexa normal and left adnexa normal. No vaginal discharge found.  Musculoskeletal: Normal range of motion.  Neurological: She is alert and oriented to person, place, and time.  Skin: Skin is warm and dry.   Psychiatric:       Appears anxious and fidgetty. Some responses  Inappropriate, i.e. talking about her wallet instead of responding to question asked. Daughter gave most of history   In consultation with Dr. Marice Potter offered the patient to have a repeat endometrial biopsy to obtain a better specimen and get a more definitive path report or to  have a D&C scheduled. She said she would like to have the [facility] however she was unwilling to wait and consult with Dr. Marice Potter.  ASSESSMENT  Post menopausal bleeding   PLAN   Left without being seen by Dr. Marice Potter.  Hopefully will reschedule here for surgical consult or for another endometrial biopsy.

## 2011-09-04 ENCOUNTER — Encounter: Payer: Self-pay | Admitting: Obstetrics and Gynecology

## 2011-09-04 ENCOUNTER — Ambulatory Visit (INDEPENDENT_AMBULATORY_CARE_PROVIDER_SITE_OTHER): Payer: Medicare Other | Admitting: Obstetrics and Gynecology

## 2011-09-04 VITALS — BP 134/90 | HR 63 | Ht 63.0 in | Wt 176.0 lb

## 2011-09-04 DIAGNOSIS — N950 Postmenopausal bleeding: Secondary | ICD-10-CM

## 2011-09-04 NOTE — Progress Notes (Signed)
56 yo [id number] with h/o postmenopausal bleeding presenting today to schedule D&C. Patient states that she has not bled in the past 5-6 months. Patient had an inadequate endometrial biopsy in November/2013 and was offered repeat endometrial biopsy today. Patient prefers to have a D&C. Risk, benefits and alternatives of the procedure were explained, including but not limited to risk of bleeding, infection and damage to adjacent organs. Patient verbalized understanding and will be contacted with surgical scheduler.

## 2011-09-15 ENCOUNTER — Encounter (HOSPITAL_COMMUNITY): Payer: Self-pay

## 2011-09-18 ENCOUNTER — Encounter (HOSPITAL_COMMUNITY): Payer: Self-pay | Admitting: Pharmacist

## 2011-09-24 ENCOUNTER — Encounter (HOSPITAL_COMMUNITY): Payer: Self-pay

## 2011-09-24 ENCOUNTER — Encounter (HOSPITAL_COMMUNITY)
Admission: RE | Admit: 2011-09-24 | Discharge: 2011-09-24 | Disposition: A | Discharge status: Home / Self Care | Payer: Medicare Other | Source: Ambulatory Visit | Attending: Obstetrics and Gynecology | Admitting: Obstetrics and Gynecology

## 2011-09-24 HISTORY — DX: Mental disorder, not otherwise specified: F99

## 2011-09-24 HISTORY — DX: Anemia, unspecified: D64.9

## 2011-09-24 HISTORY — DX: Inflammatory liver disease, unspecified: K75.9

## 2011-09-24 HISTORY — DX: Shortness of breath: R06.02

## 2011-09-24 LAB — CBC
MCHC: 31.5 g/dL (ref 30.0–36.0)
RDW: 15.2 % (ref 11.5–15.5)

## 2011-09-24 LAB — HEPATIC FUNCTION PANEL
Albumin: 3.1 g/dL — ABNORMAL LOW (ref 3.5–5.2)
Total Bilirubin: 0.4 mg/dL (ref 0.3–1.2)
Total Protein: 7.2 g/dL (ref 6.0–8.3)

## 2011-09-24 NOTE — Patient Instructions (Signed)
YOUR PROCEDURE IS SCHEDULED ZO:[id number] 09/29/11  ENTER THROUGH THE MAIN ENTRANCE OF [facility] AT:9am USE DESK PHONE AND DIAL [id number] TO INFORM us OF YOUR ARRIVAL  CALL [phone] IF YOU HAVE ANY QUESTIONS OR PROBLEMS PRIOR TO YOUR ARRIVAL.  REMEMBER: DO NOT EAT OR DRINK AFTER MIDNIGHT : Sunday  SPECIAL INSTRUCTIONS:   YOU MAY BRUSH YOUR TEETH THE MORNING OF SURGERY   TAKE THESE MEDICINES THE DAY OF SURGERY WITH SIP OF WATER:all morning meds   DO NOT WEAR JEWELRY, EYE MAKEUP, LIPSTICK OR DARK FINGERNAIL POLISH DO NOT WEAR LOTIONS DO NOT SHAVE FOR 48 HOURS PRIOR TO SURGERY  YOU WILL NOT BE ALLOWED TO DRIVE YOURSELF HOME.  NAME OF DRIVER:father- Marthenia Rolling

## 2011-09-29 ENCOUNTER — Ambulatory Visit (HOSPITAL_COMMUNITY)
Admission: RE | Admit: 2011-09-29 | Discharge: 2011-09-29 | Disposition: A | Discharge status: Home / Self Care | Payer: Medicare Other | Source: Ambulatory Visit | Attending: Obstetrics and Gynecology | Admitting: Obstetrics and Gynecology

## 2011-09-29 ENCOUNTER — Encounter (HOSPITAL_COMMUNITY)
Admission: RE | Disposition: A | Discharge status: Home / Self Care | Payer: Self-pay | Source: Ambulatory Visit | Attending: Obstetrics and Gynecology

## 2011-09-29 ENCOUNTER — Encounter (HOSPITAL_COMMUNITY): Payer: Self-pay | Admitting: *Deleted

## 2011-09-29 ENCOUNTER — Ambulatory Visit (HOSPITAL_COMMUNITY): Payer: Medicare Other | Admitting: Anesthesiology

## 2011-09-29 ENCOUNTER — Encounter (HOSPITAL_COMMUNITY): Payer: Self-pay | Admitting: Anesthesiology

## 2011-09-29 DIAGNOSIS — N950 Postmenopausal bleeding: Secondary | ICD-10-CM | POA: Insufficient documentation

## 2011-09-29 DIAGNOSIS — Z01812 Encounter for preprocedural laboratory examination: Secondary | ICD-10-CM | POA: Insufficient documentation

## 2011-09-29 DIAGNOSIS — Z01818 Encounter for other preprocedural examination: Secondary | ICD-10-CM | POA: Insufficient documentation

## 2011-09-29 HISTORY — PX: HYSTEROSCOPY WITH D & C: SHX1775

## 2011-09-29 SURGERY — DILATATION AND CURETTAGE /HYSTEROSCOPY
Anesthesia: General | Site: Vagina | Wound class: Clean Contaminated

## 2011-09-29 MED ORDER — LIDOCAINE HCL (CARDIAC) 20 MG/ML IV SOLN
INTRAVENOUS | Status: AC
  Filled 2011-09-29: qty 5

## 2011-09-29 MED ORDER — MIDAZOLAM HCL 5 MG/5ML IJ SOLN
INTRAMUSCULAR | Status: DC | PRN
  Administered 2011-09-29: 1 mg via INTRAVENOUS

## 2011-09-29 MED ORDER — FENTANYL CITRATE 0.05 MG/ML IJ SOLN
INTRAMUSCULAR | Status: AC
  Filled 2011-09-29: qty 2

## 2011-09-29 MED ORDER — ONDANSETRON HCL 4 MG/2ML IJ SOLN
INTRAMUSCULAR | Status: DC | PRN
  Administered 2011-09-29: 4 mg via INTRAVENOUS

## 2011-09-29 MED ORDER — ONDANSETRON HCL 4 MG/2ML IJ SOLN
INTRAMUSCULAR | Status: AC
  Filled 2011-09-29: qty 2

## 2011-09-29 MED ORDER — PROPOFOL 10 MG/ML IV EMUL
INTRAVENOUS | Status: DC | PRN
  Administered 2011-09-29: 175 mg via INTRAVENOUS

## 2011-09-29 MED ORDER — LIDOCAINE HCL (CARDIAC) 20 MG/ML IV SOLN
INTRAVENOUS | Status: DC | PRN
  Administered 2011-09-29: 80 mg via INTRAVENOUS

## 2011-09-29 MED ORDER — MIDAZOLAM HCL 2 MG/2ML IJ SOLN
INTRAMUSCULAR | Status: AC
  Filled 2011-09-29: qty 2

## 2011-09-29 MED ORDER — IBUPROFEN 600 MG PO TABS: 600.0000 mg | ORAL_TABLET | Freq: Four times a day (QID) | ORAL | Status: AC | PRN

## 2011-09-29 MED ORDER — GLYCINE 1.5 % IR SOLN
Status: DC | PRN
  Administered 2011-09-29: 3000 mL

## 2011-09-29 MED ORDER — LACTATED RINGERS IV SOLN
INTRAVENOUS | Status: DC
  Administered 2011-09-29 (×3): via INTRAVENOUS

## 2011-09-29 MED ORDER — KETOROLAC TROMETHAMINE 30 MG/ML IJ SOLN
INTRAMUSCULAR | Status: AC
  Filled 2011-09-29: qty 1

## 2011-09-29 MED ORDER — FENTANYL CITRATE 0.05 MG/ML IJ SOLN: 25.0000 ug | INTRAMUSCULAR | Status: DC | PRN

## 2011-09-29 MED ORDER — PROMETHAZINE HCL 25 MG/ML IJ SOLN: 6.2500 mg | INTRAMUSCULAR | Status: DC | PRN

## 2011-09-29 MED ORDER — KETOROLAC TROMETHAMINE 30 MG/ML IJ SOLN: 15.0000 mg | Freq: Once | INTRAMUSCULAR | Status: DC | PRN

## 2011-09-29 MED ORDER — MEPERIDINE HCL 25 MG/ML IJ SOLN: 6.2500 mg | INTRAMUSCULAR | Status: DC | PRN

## 2011-09-29 MED ORDER — MIDAZOLAM HCL 2 MG/2ML IJ SOLN: 0.5000 mg | Freq: Once | INTRAMUSCULAR | Status: DC | PRN

## 2011-09-29 MED ORDER — FENTANYL CITRATE 0.05 MG/ML IJ SOLN
INTRAMUSCULAR | Status: DC | PRN
  Administered 2011-09-29 (×2): 50 ug via INTRAVENOUS

## 2011-09-29 MED ORDER — CHLOROPROCAINE HCL 1 % IJ SOLN
INTRAMUSCULAR | Status: AC
  Filled 2011-09-29: qty 30

## 2011-09-29 MED ORDER — OXYCODONE-ACETAMINOPHEN 5-325 MG PO TABS: 1.0000 | ORAL_TABLET | ORAL | Status: AC | PRN

## 2011-09-29 MED ORDER — CHLOROPROCAINE HCL 1 % IJ SOLN
INTRAMUSCULAR | Status: DC | PRN
  Administered 2011-09-29: 30 mL

## 2011-09-29 MED ORDER — PROPOFOL 10 MG/ML IV EMUL
INTRAVENOUS | Status: AC
  Filled 2011-09-29: qty 20

## 2011-09-29 MED ORDER — SODIUM CHLORIDE 0.9 % IR SOLN
Status: DC | PRN
  Administered 2011-09-29: 1

## 2011-09-29 SURGICAL SUPPLY — 11 items
CATH ROBINSON RED A/P 16FR (CATHETERS) ×2 IMPLANT
CONTAINER PREFILL 10% NBF 60ML (FORM) ×4 IMPLANT
ELECTRODE RT ANGLE VERSAPOINT (CUTTING LOOP) IMPLANT
GLOVE BIOGEL PI IND STRL 6.5 (GLOVE) ×3 IMPLANT
GLOVE BIOGEL PI INDICATOR 6.5 (GLOVE) ×3
GLOVE SURG SS PI 6.0 STRL IVOR (GLOVE) ×2 IMPLANT
GOWN PREVENTION PLUS LG XLONG (DISPOSABLE) ×4 IMPLANT
GOWN STRL REIN XL XLG (GOWN DISPOSABLE) ×2 IMPLANT
PACK HYSTEROSCOPY LF (CUSTOM PROCEDURE TRAY) ×2 IMPLANT
TOWEL OR 17X24 6PK STRL BLUE (TOWEL DISPOSABLE) ×4 IMPLANT
WATER STERILE IRR 1000ML POUR (IV SOLUTION) ×2 IMPLANT

## 2011-09-29 NOTE — Anesthesia Preprocedure Evaluation (Signed)
Anesthesia Evaluation  Patient identified by MRN, date of birth, ID band Patient awake    Reviewed: Allergy & Precautions, H&P , NPO status , Patient's Chart, lab work & pertinent test results  Airway Mallampati: II TM Distance: >3 FB Neck ROM: full    Dental  (+) Poor Dentition   Pulmonary neg pulmonary ROS,  breath sounds clear to auscultation  Pulmonary exam normal       Cardiovascular     Neuro/Psych PSYCHIATRIC DISORDERS Bipolar Disorder negative neurological ROS     GI/Hepatic negative GI ROS,   Endo/Other  negative endocrine ROS  Renal/GU negative Renal ROS  negative genitourinary   Musculoskeletal negative musculoskeletal ROS (+)   Abdominal Normal abdominal exam  (+)   Peds negative pediatric ROS (+)  Hematology negative hematology ROS (+)   Anesthesia Other Findings   Reproductive/Obstetrics negative OB ROS                           Anesthesia Physical Anesthesia Plan  ASA: II  Anesthesia Plan: General   Post-op Pain Management:    Induction: Intravenous  Airway Management Planned: LMA  Additional Equipment:   Intra-op Plan:   Post-operative Plan:   Informed Consent: I have reviewed the patients History and Physical, chart, labs and discussed the procedure including the risks, benefits and alternatives for the proposed anesthesia with the patient or authorized representative who has indicated his/her understanding and acceptance.     Plan Discussed with: CRNA and Surgeon  Anesthesia Plan Comments:         Anesthesia Quick Evaluation

## 2011-09-29 NOTE — Op Note (Signed)
PREOPERATIVE DIAGNOSIS:  Postmenopausal vaginal bleeding. POSTOPERATIVE DIAGNOSIS: The same PROCEDURE: Hysteroscopy, Dilation and Curettage. SURGEON:  Dr. Catalina Antigua   INDICATIONS: 56 y.o. [id number]  here for scheduled surgery for evaluation of abnormal uterine bleeding.   Risks of surgery were discussed with the patient including but not limited to: bleeding which may require transfusion; infection which may require antibiotics; injury to uterus or surrounding organs; intrauterine scarring which may impair future fertility; need for additional procedures including laparotomy or laparoscopy; and other postoperative/anesthesia complications. Written informed consent was obtained.    FINDINGS:  A 7 week size uterus.  Atrophic endometrium.  Normal ostia bilaterally.  ANESTHESIA:   General, paracervical block. INTRAVENOUS FLUIDS:  1000 ml of LR FLUID DEFICITS:  10 ml of glycine ESTIMATED BLOOD LOSS:  Less than 20 ml SPECIMENS: Endometrial curettings sent to pathology COMPLICATIONS:  None immediate.  PROCEDURE DETAILS:  The patient was taken to the operating room where general anesthesia was administered and was found to be adequate.  After an adequate timeout was performed, she was placed in the dorsal lithotomy position and examined; then prepped and draped in the sterile manner.   Her bladder was catheterized for an unmeasured amount of clear, yellow urine. A speculum was then placed in the patient's vagina and a single tooth tenaculum was applied to the anterior lip of the cervix.   A paracervical block using 6 ml of 0.5% Nesicaine was administered.  The cervix was sounded to 7 cm and dilated manually with Hagar dilators to accommodate the 5 mm diagnostic hysteroscope.  Once the cervix was dilated, the hysteroscope was inserted under direct visualization using glycine as a suspension medium.  The uterine cavity was carefully examined, both ostia were recognized, and diffusely proliferative  endometrium was noted.   After further careful visualization of the uterine cavity, the hysteroscope was removed under direct visualization.  A sharp curettage was then performed to obtain a moderate amount of endometrial curettings.  The tenaculum was removed from the anterior lip of the cervix and the vaginal speculum was removed after noting good hemostasis.  The patient tolerated the procedure well and was taken to the recovery area awake, extubated and in stable condition.

## 2011-09-29 NOTE — Transfer of Care (Signed)
Immediate Anesthesia Transfer of Care Note  Patient: Dorothy Burns  Procedure(s) Performed: Procedure(s) (LRB): DILATATION AND CURETTAGE /HYSTEROSCOPY (N/A)  Patient Location: PACU  Anesthesia Type: General  Level of Consciousness: awake, alert  and oriented  Airway & Oxygen Therapy: Patient Spontanous Breathing and Patient connected to nasal cannula oxygen  Post-op Assessment: Report given to PACU RN and Post -op Vital signs reviewed and stable  Post vital signs: Reviewed and stable  Complications: No apparent anesthesia complications

## 2011-09-29 NOTE — H&P (Signed)
Dorothy Burns is an 56 y.o. female [phone] postmenopausal with episode of postmenopausal bleeding in septempber. Patient has not had any bleeding in the past 6 months. Patient had an inadequate endometrial biopsy in the office and presents today flor scheduled D&C with hysteroscopy.  Pertinent Gynecological History: Bleeding: post menopausal bleeding Contraception: BTL DES exposure: denies Blood transfusions: none Previous GYN Procedures: DNC  Last pap: normal Date: 2009 OB History: G8, P4044   Menstrual History:  Patient's last menstrual period was 04/29/2011.    Past Medical History  Diagnosis Date  . Hypertension   . Bipolar 1 disorder   . Menorrhagia   . Post-menopausal bleeding   . Paresthesia   . COPD (chronic obstructive pulmonary disease)   . H/O: substance abuse   . Gout   . Mental disorder     bipolar  . Asthma   . Shortness of breath   . Anemia   . Hepatitis     Hep C x 10 yrs    Past Surgical History  Procedure Date  . Tubal ligation   . Facial reconstruction surgery   . Multiple tooth extractions     Family History  Problem Relation Age of Onset  . COPD Mother   . Diabetes Mother   . Heart disease Mother   . Diabetes Father   . Heart disease Father   . Hypertension Father     Social History:  reports that she has been smoking Cigarettes.  She has a 7.5 pack-year smoking history. She has never used smokeless tobacco. She reports that she uses illicit drugs ("Crack" cocaine, Marijuana, and Cocaine). She reports that she does not drink alcohol.  Allergies: No Known Allergies  Prescriptions prior to admission  Medication Sig Dispense Refill  . ASPIRIN PO Take 1 tablet by mouth daily as needed. Pt. Didn't know strength.  She takes as needed for headache      . lithium 300 MG capsule Take 300-600 mg by mouth 3 (three) times daily with meals. Pt takes 2 capsules in the morning, 1 midday, and 2 at bedtime. Take five capsules a day.  Per Dr[facility] Kathrynn [facility]  [facility].      . pregabalin (LYRICA) 75 MG capsule Take 75 mg by mouth 2 (two) times daily.      Marland Kitchen ADVAIR DISKUS 250-50 MCG/DOSE AEPB INHALE 1 PUFF TWICE A DAY  1 each  3  . clonazePAM (KLONOPIN) 1 MG tablet Take 1 mg by mouth 3 (three) times daily as needed. Per Dr Kathrynn Running [facility].       Marland Kitchen zolpidem (AMBIEN) 5 MG tablet Take 5 mg by mouth at bedtime. Prescribes by another clinic        Review of Systems  All other systems reviewed and are negative.    Blood pressure 110/75, pulse 67, temperature 97.9 F (36.6 C), temperature source Oral, resp. rate 18, last menstrual period 04/29/2011. Physical Exam GENERAL: Well-developed, well-nourished female in no acute distress.  HEENT: Normocephalic, atraumatic. Sclerae anicteric.  NECK: Supple. Normal thyroid.  LUNGS: Clear to auscultation bilaterally.  HEART: Regular rate and rhythm. ABDOMEN: Soft, nontender, nondistended. No organomegaly. PELVIC: Normal external female genitalia. Vagina is pink and rugated.  Normal discharge. Normal appearing cervix. Uterus is normal in size. No adnexal mass or tenderness. EXTREMITIES: No cyanosis, clubbing, or edema, 2+ distal pulses.  No results found for this or any previous visit (from the past 24 hour(s)).  No results found.  Assessment/Plan: 56  yo with episode of postmenopausal vaginal bleeding in September with inadequate office endometrial biopsy here for D&C hysteroscopy - Risk, benefits and alternatives explained including but not limited to risk of bleeding, infection and damage to adjacent organs. Patient verbalized understanding and all questions were answered.  Rafaella Kole 09/29/2011, 10:01 AM

## 2011-09-29 NOTE — Anesthesia Postprocedure Evaluation (Signed)
Anesthesia Post Note  Patient: Dorothy Burns  Procedure(s) Performed: Procedure(s) (LRB): DILATATION AND CURETTAGE /HYSTEROSCOPY (N/A)  Anesthesia type: GA  Patient location: PACU  Post pain: Pain level controlled  Post assessment: Post-op Vital signs reviewed  Last Vitals:  Filed Vitals:   09/29/11 1200  BP:   Pulse: 71  Temp: 36.5 C  Resp: 23    Post vital signs: Reviewed  Level of consciousness: sedated  Complications: No apparent anesthesia complications

## 2011-09-30 ENCOUNTER — Encounter (HOSPITAL_COMMUNITY): Payer: Self-pay | Admitting: Obstetrics and Gynecology

## 2011-10-13 ENCOUNTER — Ambulatory Visit: Payer: Medicare Other | Admitting: Obstetrics and Gynecology

## 2011-12-14 ENCOUNTER — Encounter (HOSPITAL_COMMUNITY): Payer: Self-pay

## 2011-12-14 ENCOUNTER — Emergency Department (INDEPENDENT_AMBULATORY_CARE_PROVIDER_SITE_OTHER)
Admission: EM | Admit: 2011-12-14 | Discharge: 2011-12-14 | Disposition: A | Discharge status: Home Health | Payer: Medicare Other | Source: Home / Self Care | Attending: Emergency Medicine | Admitting: Emergency Medicine

## 2011-12-14 DIAGNOSIS — IMO0002 Reserved for concepts with insufficient information to code with codable children: Secondary | ICD-10-CM

## 2011-12-14 DIAGNOSIS — S239XXA Sprain of unspecified parts of thorax, initial encounter: Secondary | ICD-10-CM

## 2011-12-14 MED ORDER — SALSALATE 750 MG PO TABS: 750.0000 mg | ORAL_TABLET | Freq: Two times a day (BID) | ORAL | Status: DC

## 2011-12-14 MED ORDER — CYCLOBENZAPRINE HCL 10 MG PO TABS: 10.0000 mg | ORAL_TABLET | Freq: Three times a day (TID) | ORAL | Status: AC | PRN
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
## 2012-05-21 ENCOUNTER — Other Ambulatory Visit: Payer: Self-pay | Admitting: Family Medicine

## 2012-05-24 ENCOUNTER — Other Ambulatory Visit: Payer: Self-pay | Admitting: Family Medicine

## 2012-05-24 MED ORDER — BUDESONIDE-FORMOTEROL FUMARATE 160-4.5 MCG/ACT IN AERO: 2.0000 | INHALATION_SPRAY | Freq: Two times a day (BID) | RESPIRATORY_TRACT | Status: DC

## 2012-05-25 ENCOUNTER — Emergency Department (INDEPENDENT_AMBULATORY_CARE_PROVIDER_SITE_OTHER)
Admission: EM | Admit: 2012-05-25 | Discharge: 2012-05-25 | Disposition: A | Discharge status: Nursing Home (Includes ICF) | Payer: Medicare Other | Source: Home / Self Care | Attending: Emergency Medicine | Admitting: Emergency Medicine

## 2012-05-25 ENCOUNTER — Encounter (HOSPITAL_COMMUNITY): Payer: Self-pay | Admitting: *Deleted

## 2012-05-25 ENCOUNTER — Emergency Department (HOSPITAL_COMMUNITY): Payer: Medicare Other

## 2012-05-25 ENCOUNTER — Encounter (HOSPITAL_COMMUNITY): Payer: Self-pay | Admitting: Physical Medicine and Rehabilitation

## 2012-05-25 ENCOUNTER — Emergency Department (HOSPITAL_COMMUNITY)
Admission: EM | Admit: 2012-05-25 | Discharge: 2012-05-25 | Discharge status: Against Medical Advice | Payer: Medicare Other | Attending: Emergency Medicine | Admitting: Emergency Medicine

## 2012-05-25 DIAGNOSIS — S20229A Contusion of unspecified back wall of thorax, initial encounter: Secondary | ICD-10-CM | POA: Insufficient documentation

## 2012-05-25 DIAGNOSIS — W1781XA Fall down embankment (hill), initial encounter: Secondary | ICD-10-CM

## 2012-05-25 DIAGNOSIS — S0003XA Contusion of scalp, initial encounter: Secondary | ICD-10-CM | POA: Insufficient documentation

## 2012-05-25 DIAGNOSIS — R296 Repeated falls: Secondary | ICD-10-CM | POA: Insufficient documentation

## 2012-05-25 DIAGNOSIS — W1789XA Other fall from one level to another, initial encounter: Secondary | ICD-10-CM

## 2012-05-25 DIAGNOSIS — Y939 Activity, unspecified: Secondary | ICD-10-CM | POA: Insufficient documentation

## 2012-05-25 DIAGNOSIS — Y9289 Other specified places as the place of occurrence of the external cause: Secondary | ICD-10-CM | POA: Insufficient documentation

## 2012-05-25 DIAGNOSIS — T07XXXA Unspecified multiple injuries, initial encounter: Secondary | ICD-10-CM

## 2012-05-25 LAB — CBC WITH DIFFERENTIAL/PLATELET
Basophils Absolute: 0 10*3/uL (ref 0.0–0.1)
Basophils Relative: 0 % (ref 0–1)
Eosinophils Absolute: 0.2 10*3/uL (ref 0.0–0.7)
Eosinophils Relative: 5 % (ref 0–5)
HCT: 37.5 % (ref 36.0–46.0)
Hemoglobin: 12.3 g/dL (ref 12.0–15.0)
MCH: 30.1 pg (ref 26.0–34.0)
MCHC: 32.8 g/dL (ref 30.0–36.0)
MCV: 91.7 fL (ref 78.0–100.0)
Monocytes Absolute: 0.5 10*3/uL (ref 0.1–1.0)
Monocytes Relative: 11 % (ref 3–12)
Neutro Abs: 3.1 10*3/uL (ref 1.7–7.7)
RDW: 14.2 % (ref 11.5–15.5)
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
## 2012-05-25 NOTE — ED Notes (Signed)
Patient up to the desk and informed this RN that she is leaving.  This RN witnessed the patient leave the ED.
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
## 2012-08-01 ENCOUNTER — Encounter (HOSPITAL_COMMUNITY): Payer: Self-pay | Admitting: *Deleted

## 2012-08-01 ENCOUNTER — Emergency Department (HOSPITAL_COMMUNITY)
Admission: EM | Admit: 2012-08-01 | Discharge: 2012-08-01 | Disposition: A | Discharge status: Home / Self Care | Payer: Medicare Other | Attending: Emergency Medicine | Admitting: Emergency Medicine

## 2012-08-01 ENCOUNTER — Emergency Department (HOSPITAL_COMMUNITY): Payer: Medicare Other

## 2012-08-01 DIAGNOSIS — R1110 Vomiting, unspecified: Secondary | ICD-10-CM | POA: Insufficient documentation

## 2012-08-01 DIAGNOSIS — J4489 Other specified chronic obstructive pulmonary disease: Secondary | ICD-10-CM | POA: Insufficient documentation

## 2012-08-01 DIAGNOSIS — Z862 Personal history of diseases of the blood and blood-forming organs and certain disorders involving the immune mechanism: Secondary | ICD-10-CM | POA: Insufficient documentation

## 2012-08-01 DIAGNOSIS — Z8669 Personal history of other diseases of the nervous system and sense organs: Secondary | ICD-10-CM | POA: Insufficient documentation

## 2012-08-01 DIAGNOSIS — K8050 Calculus of bile duct without cholangitis or cholecystitis without obstruction: Secondary | ICD-10-CM

## 2012-08-01 DIAGNOSIS — F319 Bipolar disorder, unspecified: Secondary | ICD-10-CM | POA: Insufficient documentation

## 2012-08-01 DIAGNOSIS — R5383 Other fatigue: Secondary | ICD-10-CM | POA: Insufficient documentation

## 2012-08-01 DIAGNOSIS — Z8719 Personal history of other diseases of the digestive system: Secondary | ICD-10-CM | POA: Insufficient documentation

## 2012-08-01 DIAGNOSIS — F17200 Nicotine dependence, unspecified, uncomplicated: Secondary | ICD-10-CM | POA: Insufficient documentation

## 2012-08-01 DIAGNOSIS — I10 Essential (primary) hypertension: Secondary | ICD-10-CM | POA: Insufficient documentation

## 2012-08-01 DIAGNOSIS — K8020 Calculus of gallbladder without cholecystitis without obstruction: Secondary | ICD-10-CM

## 2012-08-01 DIAGNOSIS — R5381 Other malaise: Secondary | ICD-10-CM | POA: Insufficient documentation

## 2012-08-01 DIAGNOSIS — K8000 Calculus of gallbladder with acute cholecystitis without obstruction: Secondary | ICD-10-CM | POA: Insufficient documentation

## 2012-08-01 DIAGNOSIS — Z8639 Personal history of other endocrine, nutritional and metabolic disease: Secondary | ICD-10-CM | POA: Insufficient documentation

## 2012-08-01 DIAGNOSIS — J449 Chronic obstructive pulmonary disease, unspecified: Secondary | ICD-10-CM | POA: Insufficient documentation

## 2012-08-01 DIAGNOSIS — Z79899 Other long term (current) drug therapy: Secondary | ICD-10-CM | POA: Insufficient documentation

## 2012-08-01 LAB — COMPREHENSIVE METABOLIC PANEL
ALT: 19 U/L (ref 0–35)
AST: 40 U/L — ABNORMAL HIGH (ref 0–37)
Albumin: 2.9 g/dL — ABNORMAL LOW (ref 3.5–5.2)
Alkaline Phosphatase: 125 U/L — ABNORMAL HIGH (ref 39–117)
BUN: 12 mg/dL (ref 6–23)
CO2: 25 mEq/L (ref 19–32)
Calcium: 9.2 mg/dL (ref 8.4–10.5)
Chloride: 99 mEq/L (ref 96–112)
Creatinine, Ser: 0.77 mg/dL (ref 0.50–1.10)
GFR calc Af Amer: >90 mL/min (ref >90)
GFR calc non Af Amer: >90 mL/min (ref >90)
Glucose, Bld: 155 mg/dL — ABNORMAL HIGH (ref 70–99)
Potassium: 3.1 mEq/L — ABNORMAL LOW (ref 3.5–5.1)
Sodium: 134 mEq/L — ABNORMAL LOW (ref 135–145)
Total Bilirubin: 1 mg/dL (ref 0.3–1.2)
Total Protein: 7.6 g/dL (ref 6.0–8.3)

## 2012-08-01 LAB — CBC WITH DIFFERENTIAL/PLATELET
Basophils Absolute: 0 10*3/uL (ref 0.0–0.1)
Basophils Relative: 1 % (ref 0–1)
Eosinophils Absolute: 0.1 10*3/uL (ref 0.0–0.7)
Eosinophils Relative: 2 % (ref 0–5)
HCT: 36.2 % (ref 36.0–46.0)
Hemoglobin: 12 g/dL (ref 12.0–15.0)
Lymphocytes Relative: 19 % (ref 12–46)
Lymphs Abs: 1.2 10*3/uL (ref 0.7–4.0)
MCH: 29.3 pg (ref 26.0–34.0)
MCHC: 33.1 g/dL (ref 30.0–36.0)
MCV: 88.5 fL (ref 78.0–100.0)
Monocytes Absolute: 0.8 10*3/uL (ref 0.1–1.0)
Monocytes Relative: 13 % — ABNORMAL HIGH (ref 3–12)
Neutro Abs: 4.4 10*3/uL (ref 1.7–7.7)
Neutrophils Relative %: 67 % (ref 43–77)
Platelets: 154 10*3/uL (ref 150–400)
RBC: 4.09 MIL/uL (ref 3.87–5.11)
RDW: 14.1 % (ref 11.5–15.5)
WBC: 6.6 10*3/uL (ref 4.0–10.5)

## 2012-08-01 LAB — URINALYSIS, MICROSCOPIC ONLY
Bilirubin Urine: NEGATIVE
Glucose, UA: NEGATIVE mg/dL
Hgb urine dipstick: NEGATIVE
Ketones, ur: NEGATIVE mg/dL
Leukocytes, UA: NEGATIVE
Nitrite: NEGATIVE
Protein, ur: NEGATIVE mg/dL
Specific Gravity, Urine: 1.007 (ref 1.005–1.030)
Urobilinogen, UA: 1 mg/dL (ref 0.0–1.0)
pH: 7.5 (ref 5.0–8.0)

## 2012-08-01 LAB — LIPASE, BLOOD: Lipase: 35 U/L (ref 11–59)

## 2012-08-01 MED ORDER — POTASSIUM CHLORIDE CRYS ER 20 MEQ PO TBCR
20.0000 meq | EXTENDED_RELEASE_TABLET | Freq: Once | ORAL | Status: AC
  Administered 2012-08-01: 20 meq via ORAL
  Filled 2012-08-01: qty 1

## 2012-08-01 MED ORDER — ONDANSETRON HCL 4 MG/2ML IJ SOLN
4.0000 mg | Freq: Once | INTRAMUSCULAR | Status: AC
  Administered 2012-08-01: 4 mg via INTRAVENOUS
  Filled 2012-08-01: qty 2

## 2012-08-01 MED ORDER — MORPHINE SULFATE 4 MG/ML IJ SOLN
4.0000 mg | Freq: Once | INTRAMUSCULAR | Status: AC
  Administered 2012-08-01: 4 mg via INTRAVENOUS
  Filled 2012-08-01: qty 1

## 2012-08-01 MED ORDER — OXYCODONE-ACETAMINOPHEN 5-325 MG PO TABS: 1.0000 | ORAL_TABLET | ORAL | Status: DC | PRN

## 2012-08-01 MED ORDER — SODIUM CHLORIDE 0.9 % IV SOLN
Freq: Once | INTRAVENOUS | Status: AC
  Administered 2012-08-01: 15:00:00 via INTRAVENOUS

## 2012-08-01 MED ORDER — POTASSIUM CHLORIDE IN NACL 20-0.9 MEQ/L-% IV SOLN
Freq: Once | INTRAVENOUS | Status: AC
  Administered 2012-08-01: 16:00:00 via INTRAVENOUS
  Filled 2012-08-01: qty 1000

## 2012-08-01 NOTE — ED Provider Notes (Signed)
History    57 year old female with intermittent abdominal pain. Right upper quadrant. Has been going on for over a week. Patient states that it feels like "there is a fish there and sometimes it comes up and bites me then goes back down." Patient has not noticed any appreciable exacerbating relieving factors. Patient has also been having vomiting and diarrhea since Thursday. No blood in her stool. No fevers or chills. No urinary complaints. No sick contacts. Patient is also complaining of a small lesion to her right breast she is unsure how this got there. Does not particularly bother her. She first noticed this earlier today.   CSN: [id number]  Arrival date & time 08/01/12  1317   First MD Initiated Contact with Patient 08/01/12 1458      Chief Complaint  Patient presents with  . Emesis  . Diarrhea  . Fatigue    (Consider location/radiation/quality/duration/timing/severity/associated sxs/prior treatment) HPI  Past Medical History  Diagnosis Date  . Hypertension   . Bipolar 1 disorder   . Menorrhagia   . Post-menopausal bleeding   . Paresthesia   . COPD (chronic obstructive pulmonary disease)   . H/O: substance abuse     IVDU cocaine last 65  . Gout   . Mental disorder     bipolar  . Asthma   . Shortness of breath   . Anemia   . Hepatitis     Hep C x 10 yrs    Past Surgical History  Procedure Date  . Tubal ligation   . Facial reconstruction surgery   . Multiple tooth extractions   . Hysteroscopy w/d&c 09/29/2011    Procedure: DILATATION AND CURETTAGE /HYSTEROSCOPY;  Surgeon: Catalina Antigua, MD;  Location: [facility];  Service: Gynecology;  Laterality: N/A;    Family History  Problem Relation Age of Onset  . COPD Mother   . Diabetes Mother   . Heart disease Mother   . Diabetes Father   . Heart disease Father   . Hypertension Father     History  Substance Use Topics  . Smoking status: Current Every Day Smoker -- 2.0 packs/day for 30 years    Types: Cigarettes   . Smokeless tobacco: Never Used     Comment: in process of quitting---would like nicotine patch if insurance covers it  . Alcohol Use: No     Comment: recovering alcoholic    OB History    Grav Para Term Preterm Abortions TAB SAB Ect Mult Living   8 4 4  0 4 1 3   4       Review of Systems  All systems reviewed and negative, other than as noted in HPI.   Allergies  Review of patient's allergies indicates no known allergies.  Home Medications   Current Outpatient Rx  Name  Route  Sig  Dispense  Refill  . BUDESONIDE-FORMOTEROL FUMARATE 160-4.5 MCG/ACT IN AERO   Inhalation   Inhale 2 puffs into the lungs 2 (two) times daily.   1 Inhaler   3   . CLONAZEPAM 1 MG PO TABS   Oral   Take 1 mg by mouth 3 (three) times daily as needed. For anxiety         . HYDROCODONE-ACETAMINOPHEN 5-325 MG PO TABS   Oral   Take 1 tablet by mouth every 6 (six) hours as needed. For pain         . INDOMETHACIN 25 MG PO CAPS   Oral   Take 25 mg by mouth daily.         Marland Kitchen  LITHIUM CARBONATE 300 MG PO CAPS   Oral   Take 300-600 mg by mouth 3 (three) times daily with meals. Pt takes 2 capsules in the morning, 1 midday, and 2 at bedtime         . PREGABALIN 75 MG PO CAPS   Oral   Take 75 mg by mouth 2 (two) times daily.         . TRAMADOL HCL 50 MG PO TABS   Oral   Take 50 mg by mouth every 8 (eight) hours as needed. For pain         . ZOLPIDEM TARTRATE 5 MG PO TABS   Oral   Take 5 mg by mouth at bedtime.            BP 105/57  Pulse 79  Temp 98.9 F (37.2 C) (Oral)  Resp 15  SpO2 99%  LMP 04/29/2011  Physical Exam  Nursing note and vitals reviewed. Constitutional: She appears well-developed and well-nourished. No distress.  HENT:  Head: Normocephalic and atraumatic.  Eyes: Conjunctivae normal are normal. Right eye exhibits no discharge. Left eye exhibits no discharge.  Neck: Neck supple.  Cardiovascular: Normal rate, regular rhythm and normal heart sounds.  Exam  reveals no gallop and no friction rub.   No murmur heard. Pulmonary/Chest: Effort normal and breath sounds normal. No respiratory distress.  Abdominal: Soft. She exhibits no distension. There is tenderness.       Mild tenderness in the right upper quadrant without rebound or guarding. No distention.  Musculoskeletal: She exhibits no edema and no tenderness.  Neurological: She is alert.  Skin: Skin is warm and dry.       The inferior aspect of patient's right areola with small area which is consistent with a superficial abrasion. No evidence of abscess or surrounding cellulitis. No drainage. No significant tenderness. Not warm to the touch.  Psychiatric: She has a normal mood and affect. Her behavior is normal. Thought content normal.    ED Course  Procedures (including critical care time)  Labs Reviewed  CBC WITH DIFFERENTIAL - Abnormal; Notable for the following:    Monocytes Relative 13 (*)     All other components within normal limits  COMPREHENSIVE METABOLIC PANEL - Abnormal; Notable for the following:    Sodium 134 (*)     Potassium 3.1 (*)     Glucose, Bld 155 (*)     Albumin 2.9 (*)     AST 40 (*)     Alkaline Phosphatase 125 (*)     All other components within normal limits  URINALYSIS, MICROSCOPIC ONLY - Abnormal; Notable for the following:    APPearance CLOUDY (*)     All other components within normal limits  LIPASE, BLOOD   US Abdomen Complete  08/01/2012  *RADIOLOGY REPORT*  Clinical Data:  Right upper abdominal pain  COMPLETE ABDOMINAL ULTRASOUND  Comparison:  MR 12/24/2007 and earlier studies  Findings:  Gallbladder:  Physiologically distended with layering small stones and sludge.  There is no definite gallbladder wall thickening or pericholecystic fluid.  Sonographer reports no sonographic Murphy's sign.  Common bile duct:  4.6 mm diameter, unremarkable.  Liver:  17 x 19 x 26 mm sharply demarcated lesion in the posterior left hepatic lobe.  This appears separate from  the echogenic periportal adenopathy identified on the previous study.  There is no intrahepatic biliary ductal dilatation.  No other focal lesion identified.  IVC:  Appears normal.  Pancreas:  No focal  abnormality seen.  Spleen:  10.2 cm craniocaudal length, unremarkable.  Right Kidney:  13 cm in length.  9 x 10 mm cyst from the upper pole.  No solid renal mass or hydronephrosis.  Left Kidney:  13.1 cm. No hydronephrosis.  Well-preserved cortex. Normal size and parenchymal echotexture without focal abnormalities.  Abdominal aorta:  No aneurysm identified.  IMPRESSION:  1.  Cholelithiasis without other ultrasound evidence of cholecystitis or biliary obstruction. 2.  2.6 cm liver lesion.  Consider elective liver MR   with contrast for further characterization.   Original Report Authenticated By: D. Andria Rhein, MD      1. Biliary colic   2. Cholelithiasis       MDM  57 year old female with intermittent right upper quadrant pain. Ultrasound significant for cholelithiasis. Clinically not cholecystitis and no ultrasound evidence of this either. Patient's pain is well-controlled prior to discharge. She will be discharged with pain medication. Emergent return precautions were discussed. She was provided with referral information for a general surgeon.        Raeford Razor, MD 08/01/12 [phone]

## 2012-08-01 NOTE — ED Notes (Signed)
Denies vomiting today but states she did vomit yesterday also states that her diarrhea started today

## 2012-08-01 NOTE — ED Notes (Signed)
Pt reports generalized fatigue, intermittent vomiting and diarrhea since Thursday. Has abcess to right breast. No acute distress noted at triage.

## 2012-08-01 NOTE — ED Notes (Signed)
Pt amb to BR. States that she has had vomiting and diarrhea x 1 week.  Also c/o blister area to right lower breast

## 2012-08-01 NOTE — ED Notes (Signed)
Patient transported to Ultrasound 

## 2012-08-01 NOTE — ED Notes (Signed)
Pt attempted to get urine specimen without success.  daughter is at bedside

## 2012-08-01 NOTE — ED Notes (Signed)
Ambulatory to bathroom.

## 2012-08-01 NOTE — ED Notes (Signed)
Remains in US.

## 2012-08-03 ENCOUNTER — Telehealth (INDEPENDENT_AMBULATORY_CARE_PROVIDER_SITE_OTHER): Payer: Self-pay

## 2012-08-03 NOTE — Telephone Encounter (Signed)
Patient called into office today requesting a refill of her pain medication which she was given by Emergency Department Physician.  Patient given a prescription for Oxycodone 5/325mg , #20 on 08/01/12 at time of her discharge from hospital (ED).  Patient made aware that we will not refill medication that another physician has prescribed and patient need's to be physically seen for further medical assessment and a treatment plan.  Patient advised that she would need to go back to the emergency room if she has an increase in her symptoms, fever, nausea or vomiting or uncontrolled pain.  Patient aware of appointment with Dr. Derrell Lolling on Thursday 08/05/12 @ 9:00 am.

## 2012-08-05 ENCOUNTER — Ambulatory Visit (INDEPENDENT_AMBULATORY_CARE_PROVIDER_SITE_OTHER): Payer: Medicare Other | Admitting: General Surgery

## 2012-08-05 VITALS — BP 110/70 | HR 84 | Temp 96.8°F | Resp 18 | Ht 63.0 in | Wt 161.6 lb

## 2012-08-05 DIAGNOSIS — K8020 Calculus of gallbladder without cholecystitis without obstruction: Secondary | ICD-10-CM

## 2012-08-05 NOTE — Progress Notes (Signed)
Patient ID: Dorothy Burns, female   DOB: 12/22/1955, 57 y.o.   MRN: [id number]  No chief complaint on file.   HPI [name] is a 57 y.o. female.  The patient is a 57-year-old female who describes approximately one week history of right upper quadrant pain. The patient presented to the ED was evaluated with an ultrasound which revealed multiple gallstones within the gallbladder.the patient alkaline phosphatase of 125. Her LFTs were otherwise normal.the patient describes nausea vomiting after meals which does resolve some of her pain. HPI  Past Medical History  Diagnosis Date  . Hypertension   . Bipolar 1 disorder   . Menorrhagia   . Post-menopausal bleeding   . Paresthesia   . COPD (chronic obstructive pulmonary disease)   . H/O: substance abuse     IVDU cocaine last 1992  . Gout   . Mental disorder     bipolar  . Asthma   . Shortness of breath   . Anemia   . Hepatitis     Hep C x 10 yrs    Past Surgical History  Procedure Date  . Tubal ligation   . Facial reconstruction surgery   . Multiple tooth extractions   . Hysteroscopy w/d&c 09/29/2011    Procedure: DILATATION AND CURETTAGE /HYSTEROSCOPY;  Surgeon: Peggy Constant, MD;  Location: [facility];  Service: Gynecology;  Laterality: N/A;    Family History  Problem Relation Age of Onset  . COPD Mother   . Diabetes Mother   . Heart disease Mother   . Diabetes Father   . Heart disease Father   . Hypertension Father     Social History History  Substance Use Topics  . Smoking status: Current Every Day Smoker -- 2.0 packs/day for 30 years    Types: Cigarettes  . Smokeless tobacco: Never Used     Comment: in process of quitting---would like nicotine patch if insurance covers it  . Alcohol Use: No     Comment: recovering alcoholic    No Known Allergies  Current Outpatient Prescriptions  Medication Sig Dispense Refill  . sulfamethoxazole-trimethoprim (BACTRIM DS) 800-160 MG per tablet Take 1 tablet by mouth 2 (two)  times daily.      . budesonide-formoterol (SYMBICORT) 160-4.5 MCG/ACT inhaler Inhale 2 puffs into the lungs 2 (two) times daily.  1 Inhaler  3  . clonazePAM (KLONOPIN) 1 MG tablet Take 1 mg by mouth 3 (three) times daily as needed. For anxiety      . HYDROcodone-acetaminophen (NORCO/VICODIN) 5-325 MG per tablet Take 1 tablet by mouth every 6 (six) hours as needed. For pain      . indomethacin (INDOCIN) 25 MG capsule Take 25 mg by mouth daily.      . lithium 300 MG capsule Take 300-600 mg by mouth 3 (three) times daily with meals. Pt takes 2 capsules in the morning, 1 midday, and 2 at bedtime      . oxyCODONE-acetaminophen (PERCOCET/ROXICET) 5-325 MG per tablet Take 1-2 tablets by mouth every 4 (four) hours as needed for pain.  20 tablet  0  . pregabalin (LYRICA) 75 MG capsule Take 75 mg by mouth 2 (two) times daily.      . traMADol (ULTRAM) 50 MG tablet Take 50 mg by mouth every 8 (eight) hours as needed. For pain      . zolpidem (AMBIEN) 5 MG tablet Take 5 mg by mouth at bedtime.         Review of Systems Review of   Systems  HENT: Negative.   Respiratory: Negative.   Cardiovascular: Negative.   Gastrointestinal: Negative.   Neurological: Negative.     Blood pressure 110/70, pulse 84, temperature 96.8 F (36 C), temperature source Temporal, resp. rate 18, height 5' 3" (1.6 m), weight 161 lb 9.6 oz (73.301 kg), last menstrual period 04/29/2011.  Physical Exam Physical Exam  Constitutional: She is oriented to person, place, and time. She appears well-developed and well-nourished.  HENT:  Head: Normocephalic and atraumatic.  Eyes: Conjunctivae normal are normal. Pupils are equal, round, and reactive to light.  Neck: Normal range of motion.  Cardiovascular: Normal rate, regular rhythm and normal heart sounds.   Pulmonary/Chest: Effort normal and breath sounds normal.  Abdominal: Soft. Bowel sounds are normal. There is tenderness (ruq).  Musculoskeletal: Normal range of motion.   Neurological: She is alert and oriented to person, place, and time.    Data Reviewed Ultrasound revealed multiple gallstones  elevated alkaline phosphatase, Normal T. Bili and LFTs  Assessment    57-year-old female with symptomatic cholelithiasis.    Plan    1. We'll proceed to 100 for laparoscopic cholecystectomy with intraoperative cholangiogram.  2.All risks and benefits were discussed with the patient, to generally include infection, bleeding, damage to surrounding structures, and recurrence. Alternatives were offered and described.  All questions were answered and the patient voiced understanding of the procedure and wishes to proceed at this point.        Teaghan Formica Jr., Jeriann Sayres 08/05/2012, 9:18 AM    

## 2012-08-11 ENCOUNTER — Telehealth (INDEPENDENT_AMBULATORY_CARE_PROVIDER_SITE_OTHER): Payer: Self-pay | Admitting: General Surgery

## 2012-08-11 NOTE — Telephone Encounter (Signed)
Rx written and taken to front desk

## 2012-08-11 NOTE — Telephone Encounter (Signed)
Patient called back- made her aware this is being written and will be at the front for pick up.

## 2012-08-11 NOTE — Telephone Encounter (Signed)
Pt called to request additional pain medication; surgery for lap chole is scheduled for 08/20/12.  She was given Percocet # 20 on 08/01/12.  Please advise.

## 2012-08-11 NOTE — Telephone Encounter (Signed)
She can have a refill # 20.  Please advise her to stay on a low fat diet as this is the only thing that will prevent her RUQ pain.

## 2012-08-13 ENCOUNTER — Encounter (HOSPITAL_COMMUNITY): Payer: Self-pay | Admitting: Pharmacy Technician

## 2012-08-13 ENCOUNTER — Other Ambulatory Visit (HOSPITAL_COMMUNITY): Payer: Self-pay | Admitting: General Surgery

## 2012-08-13 NOTE — Patient Instructions (Addendum)
20 Dorothy Burns  08/13/2012   Your procedure is scheduled on: 08-20-12  Report to [facility] at 0700 AM.  Call this number if you have problems the morning of surgery [phone]   Remember:   Do not eat food or drink liquids :After Midnight.     Take these medicines the morning of surgery with A SIP OF WATER:   CLONAZEPAM, LITHIUM, LYRICA.  USE YOUR SYMBICORT INHALER AND USE YOUR EYE DROPS.                                SEE [place] PREPARING FOR SURGERY SHEET   Do not wear jewelry, make-up or nail polish.  Do not wear lotions, powders, or perfumes. You may wear deodorant.   Men may shave face and neck.  Do not bring valuables to the hospital.  Contacts, dentures or bridgework may not be worn into surgery.  Leave suitcase in the car. After surgery it may be brought to your room.  For patients admitted to the hospital, checkout time is 11:00 AM the day of discharge.   Patients discharged the day of surgery will not be allowed to drive home.  Name and phone number of your driver:  Special Instructions: N/A   Please read over the following fact sheets that you were given: MRSA Information.      FAILURE TO FOLLOW THESE INSTRUCTIONS MAY RESULT IN THE CANCELLATION OF YOUR SURGERY. PATIENT SIGNATURE___________________________________________

## 2012-08-16 ENCOUNTER — Encounter (HOSPITAL_COMMUNITY): Payer: Self-pay

## 2012-08-16 ENCOUNTER — Encounter (HOSPITAL_COMMUNITY)
Admission: RE | Admit: 2012-08-16 | Discharge: 2012-08-16 | Disposition: A | Discharge status: Home / Self Care | Payer: Medicare Other | Source: Ambulatory Visit | Attending: General Surgery | Admitting: General Surgery

## 2012-08-16 ENCOUNTER — Telehealth (INDEPENDENT_AMBULATORY_CARE_PROVIDER_SITE_OTHER): Payer: Self-pay | Admitting: General Surgery

## 2012-08-16 ENCOUNTER — Encounter (HOSPITAL_COMMUNITY): Payer: Self-pay | Admitting: Pharmacy Technician

## 2012-08-16 ENCOUNTER — Ambulatory Visit (HOSPITAL_COMMUNITY)
Admission: RE | Admit: 2012-08-16 | Discharge: 2012-08-16 | Disposition: A | Discharge status: Home / Self Care | Payer: Medicare Other | Source: Ambulatory Visit | Attending: General Surgery | Admitting: General Surgery

## 2012-08-16 DIAGNOSIS — S2239XA Fracture of one rib, unspecified side, initial encounter for closed fracture: Secondary | ICD-10-CM | POA: Insufficient documentation

## 2012-08-16 DIAGNOSIS — Z01812 Encounter for preprocedural laboratory examination: Secondary | ICD-10-CM | POA: Insufficient documentation

## 2012-08-16 DIAGNOSIS — X58XXXA Exposure to other specified factors, initial encounter: Secondary | ICD-10-CM | POA: Insufficient documentation

## 2012-08-16 DIAGNOSIS — K8020 Calculus of gallbladder without cholecystitis without obstruction: Secondary | ICD-10-CM | POA: Insufficient documentation

## 2012-08-16 DIAGNOSIS — Z01810 Encounter for preprocedural cardiovascular examination: Secondary | ICD-10-CM | POA: Insufficient documentation

## 2012-08-16 HISTORY — DX: Unspecified osteoarthritis, unspecified site: M19.90

## 2012-08-16 HISTORY — DX: Gastro-esophageal reflux disease without esophagitis: K21.9

## 2012-08-16 HISTORY — DX: Calculus of gallbladder without cholecystitis without obstruction: K80.20

## 2012-08-16 HISTORY — DX: Headache: R51

## 2012-08-16 LAB — CBC
Hemoglobin: 12.2 g/dL (ref 12.0–15.0)
Platelets: 135 10*3/uL — ABNORMAL LOW (ref 150–400)
RBC: 4.15 MIL/uL (ref 3.87–5.11)
WBC: 3.3 10*3/uL — ABNORMAL LOW (ref 4.0–10.5)

## 2012-08-16 LAB — COMPREHENSIVE METABOLIC PANEL
ALT: 19 U/L (ref 0–35)
Alkaline Phosphatase: 111 U/L (ref 39–117)
CO2: 28 mEq/L (ref 19–32)
Calcium: 9.5 mg/dL (ref 8.4–10.5)
Chloride: 108 mEq/L (ref 96–112)
GFR calc Af Amer: 79 mL/min — ABNORMAL LOW (ref >90)
GFR calc non Af Amer: 68 mL/min — ABNORMAL LOW (ref >90)
Glucose, Bld: 78 mg/dL (ref 70–99)
Potassium: 4 mEq/L (ref 3.5–5.1)
Sodium: 138 mEq/L (ref 135–145)
Total Bilirubin: 0.3 mg/dL (ref 0.3–1.2)

## 2012-08-16 MED ORDER — CHLORHEXIDINE GLUCONATE 4 % EX LIQD: 1.0000 "application " | Freq: Once | CUTANEOUS | Status: DC

## 2012-08-16 NOTE — Pre-Procedure Instructions (Signed)
PREOP CBC, CMET, PT, PTT, EKG AND CXR WERE DONE TODAY AT [facility] AS PER ANESTHESIOLOGIST'S GUIDELINES.  PREOP PRELIMINARY EKG  " T WAVE ABNORMALITY, CONSIDER LATERAL ISCHEMIA"  --NO OLD EKG FOR COMPARISON--EKG SHOWN TO DR. EWELL--HE FEELS EKG OK FOR SURGERY.

## 2012-08-16 NOTE — Telephone Encounter (Signed)
Pt called and requested letter be sent to her Tax adviser, explaining she will be having surgery on 08/20/12 and will be unable to participate in community service until such time as she is released to do so.  Please FAX the letter to "Mr. Ferguson" at [phone].

## 2012-08-17 NOTE — Telephone Encounter (Signed)
i will ask AR on Thursday 1/30 an then fax the letter

## 2012-08-19 ENCOUNTER — Encounter (INDEPENDENT_AMBULATORY_CARE_PROVIDER_SITE_OTHER): Payer: Self-pay | Admitting: General Surgery

## 2012-08-19 ENCOUNTER — Telehealth (INDEPENDENT_AMBULATORY_CARE_PROVIDER_SITE_OTHER): Payer: Self-pay | Admitting: General Surgery

## 2012-08-19 NOTE — Telephone Encounter (Signed)
Confirmation received that the fax did go through

## 2012-08-19 NOTE — Telephone Encounter (Signed)
error 

## 2012-08-19 NOTE — Telephone Encounter (Signed)
Letter for community service to keep patient out for 1 week faxed to Mr.Emelda Fear [phone].

## 2012-08-20 ENCOUNTER — Ambulatory Visit (HOSPITAL_COMMUNITY): Payer: Medicare Other

## 2012-08-20 ENCOUNTER — Encounter (HOSPITAL_COMMUNITY): Payer: Self-pay | Admitting: Anesthesiology

## 2012-08-20 ENCOUNTER — Ambulatory Visit (HOSPITAL_COMMUNITY): Payer: Medicare Other | Admitting: Anesthesiology

## 2012-08-20 ENCOUNTER — Encounter (HOSPITAL_COMMUNITY): Payer: Self-pay | Admitting: *Deleted

## 2012-08-20 ENCOUNTER — Ambulatory Visit (HOSPITAL_COMMUNITY)
Admission: RE | Admit: 2012-08-20 | Discharge: 2012-08-20 | Disposition: A | Discharge status: Home / Self Care | Payer: Medicare Other | Source: Ambulatory Visit | Attending: General Surgery | Admitting: General Surgery

## 2012-08-20 ENCOUNTER — Encounter (HOSPITAL_COMMUNITY)
Admission: RE | Disposition: A | Discharge status: Home / Self Care | Payer: Self-pay | Source: Ambulatory Visit | Attending: General Surgery

## 2012-08-20 DIAGNOSIS — J449 Chronic obstructive pulmonary disease, unspecified: Secondary | ICD-10-CM | POA: Insufficient documentation

## 2012-08-20 DIAGNOSIS — J4489 Other specified chronic obstructive pulmonary disease: Secondary | ICD-10-CM | POA: Insufficient documentation

## 2012-08-20 DIAGNOSIS — I10 Essential (primary) hypertension: Secondary | ICD-10-CM | POA: Insufficient documentation

## 2012-08-20 DIAGNOSIS — Z79899 Other long term (current) drug therapy: Secondary | ICD-10-CM | POA: Insufficient documentation

## 2012-08-20 DIAGNOSIS — K7460 Unspecified cirrhosis of liver: Secondary | ICD-10-CM | POA: Insufficient documentation

## 2012-08-20 DIAGNOSIS — K8010 Calculus of gallbladder with chronic cholecystitis without obstruction: Secondary | ICD-10-CM | POA: Insufficient documentation

## 2012-08-20 HISTORY — PX: CHOLECYSTECTOMY: SHX55

## 2012-08-20 SURGERY — LAPAROSCOPIC CHOLECYSTECTOMY
Anesthesia: General | Site: Abdomen | Wound class: Clean

## 2012-08-20 MED ORDER — CEFAZOLIN SODIUM-DEXTROSE 2-3 GM-% IV SOLR
2.0000 g | INTRAVENOUS | Status: AC
  Administered 2012-08-20: 2 g via INTRAVENOUS

## 2012-08-20 MED ORDER — IOHEXOL 300 MG/ML  SOLN
INTRAMUSCULAR | Status: AC
  Filled 2012-08-20: qty 1

## 2012-08-20 MED ORDER — NEOSTIGMINE METHYLSULFATE 1 MG/ML IJ SOLN
INTRAMUSCULAR | Status: DC | PRN
  Administered 2012-08-20: 4 mg via INTRAVENOUS

## 2012-08-20 MED ORDER — KETOROLAC TROMETHAMINE 30 MG/ML IJ SOLN
INTRAMUSCULAR | Status: AC
  Filled 2012-08-20: qty 1

## 2012-08-20 MED ORDER — LIDOCAINE HCL (CARDIAC) 20 MG/ML IV SOLN
INTRAVENOUS | Status: DC | PRN
  Administered 2012-08-20: 100 mg via INTRAVENOUS

## 2012-08-20 MED ORDER — SODIUM CHLORIDE 0.9 % IJ SOLN: 3.0000 mL | Freq: Two times a day (BID) | INTRAMUSCULAR | Status: DC

## 2012-08-20 MED ORDER — CEFAZOLIN SODIUM-DEXTROSE 2-3 GM-% IV SOLR
INTRAVENOUS | Status: AC
  Filled 2012-08-20: qty 50

## 2012-08-20 MED ORDER — DEXAMETHASONE SODIUM PHOSPHATE 10 MG/ML IJ SOLN
INTRAMUSCULAR | Status: DC | PRN
  Administered 2012-08-20: 4 mg via INTRAVENOUS

## 2012-08-20 MED ORDER — OXYCODONE HCL 5 MG PO TABS: 5.0000 mg | ORAL_TABLET | ORAL | Status: DC | PRN

## 2012-08-20 MED ORDER — SUFENTANIL CITRATE 50 MCG/ML IV SOLN
INTRAVENOUS | Status: DC | PRN
  Administered 2012-08-20: 10 ug via INTRAVENOUS
  Administered 2012-08-20: 20 ug via INTRAVENOUS

## 2012-08-20 MED ORDER — LACTATED RINGERS IV SOLN
INTRAVENOUS | Status: DC | PRN
  Administered 2012-08-20: 1000 mL via INTRAVENOUS

## 2012-08-20 MED ORDER — OXYCODONE-ACETAMINOPHEN 5-325 MG PO TABS: 1.0000 | ORAL_TABLET | ORAL | Status: DC | PRN

## 2012-08-20 MED ORDER — PROPOFOL 10 MG/ML IV BOLUS
INTRAVENOUS | Status: DC | PRN
  Administered 2012-08-20: 150 mg via INTRAVENOUS

## 2012-08-20 MED ORDER — PROMETHAZINE HCL 25 MG/ML IJ SOLN
6.2500 mg | INTRAMUSCULAR | Status: DC | PRN
  Administered 2012-08-20: 6.25 mg via INTRAVENOUS
  Filled 2012-08-20: qty 1

## 2012-08-20 MED ORDER — SUCCINYLCHOLINE CHLORIDE 20 MG/ML IJ SOLN
INTRAMUSCULAR | Status: DC | PRN
  Administered 2012-08-20: 60 mg via INTRAVENOUS

## 2012-08-20 MED ORDER — SODIUM CHLORIDE 0.9 % IJ SOLN: 3.0000 mL | INTRAMUSCULAR | Status: DC | PRN

## 2012-08-20 MED ORDER — GLYCOPYRROLATE 0.2 MG/ML IJ SOLN
INTRAMUSCULAR | Status: DC | PRN
  Administered 2012-08-20: 0.6 mg via INTRAVENOUS

## 2012-08-20 MED ORDER — ACETAMINOPHEN 325 MG PO TABS: 650.0000 mg | ORAL_TABLET | ORAL | Status: DC | PRN

## 2012-08-20 MED ORDER — ONDANSETRON HCL 4 MG/2ML IJ SOLN
4.0000 mg | Freq: Four times a day (QID) | INTRAMUSCULAR | Status: DC | PRN
  Administered 2012-08-20: 4 mg via INTRAVENOUS
  Filled 2012-08-20: qty 2

## 2012-08-20 MED ORDER — SODIUM CHLORIDE 0.9 % IV SOLN
INTRAVENOUS | Status: DC | PRN
  Administered 2012-08-20: 10:00:00

## 2012-08-20 MED ORDER — LACTATED RINGERS IV SOLN
INTRAVENOUS | Status: DC
  Administered 2012-08-20: 1000 mL via INTRAVENOUS
  Administered 2012-08-20: 10:00:00 via INTRAVENOUS

## 2012-08-20 MED ORDER — CISATRACURIUM BESYLATE (PF) 10 MG/5ML IV SOLN
INTRAVENOUS | Status: DC | PRN
  Administered 2012-08-20: 4 mg via INTRAVENOUS
  Administered 2012-08-20: 2 mg via INTRAVENOUS

## 2012-08-20 MED ORDER — HYDROMORPHONE HCL PF 1 MG/ML IJ SOLN
INTRAMUSCULAR | Status: DC | PRN
  Administered 2012-08-20 (×2): .4 mg via INTRAVENOUS

## 2012-08-20 MED ORDER — ONDANSETRON HCL 4 MG/2ML IJ SOLN
INTRAMUSCULAR | Status: DC | PRN
  Administered 2012-08-20: 4 mg via INTRAVENOUS

## 2012-08-20 MED ORDER — BUPIVACAINE-EPINEPHRINE PF 0.25-1:200000 % IJ SOLN
INTRAMUSCULAR | Status: DC | PRN
  Administered 2012-08-20: 15 mL

## 2012-08-20 MED ORDER — FENTANYL CITRATE 0.05 MG/ML IJ SOLN: 25.0000 ug | INTRAMUSCULAR | Status: DC | PRN

## 2012-08-20 MED ORDER — BUPIVACAINE-EPINEPHRINE PF 0.25-1:200000 % IJ SOLN
INTRAMUSCULAR | Status: AC
  Filled 2012-08-20: qty 30

## 2012-08-20 MED ORDER — MIDAZOLAM HCL 5 MG/5ML IJ SOLN
INTRAMUSCULAR | Status: DC | PRN
  Administered 2012-08-20: 2 mg via INTRAVENOUS

## 2012-08-20 MED ORDER — KETOROLAC TROMETHAMINE 30 MG/ML IJ SOLN
15.0000 mg | Freq: Once | INTRAMUSCULAR | Status: AC | PRN
  Administered 2012-08-20: 30 mg via INTRAVENOUS

## 2012-08-20 MED ORDER — SODIUM CHLORIDE 0.9 % IV SOLN: 250.0000 mL | INTRAVENOUS | Status: DC | PRN

## 2012-08-20 MED ORDER — ACETAMINOPHEN 650 MG RE SUPP
650.0000 mg | RECTAL | Status: DC | PRN
  Filled 2012-08-20: qty 1

## 2012-08-20 MED ORDER — 0.9 % SODIUM CHLORIDE (POUR BTL) OPTIME
TOPICAL | Status: DC | PRN
  Administered 2012-08-20: 1000 mL

## 2012-08-20 SURGICAL SUPPLY — 39 items
APPLIER CLIP 5 13 M/L LIGAMAX5 (MISCELLANEOUS) ×3
BENZOIN TINCTURE PRP APPL 2/3 (GAUZE/BANDAGES/DRESSINGS) ×3 IMPLANT
CABLE HIGH FREQUENCY MONO STRZ (ELECTRODE) ×3 IMPLANT
CANISTER SUCTION 2500CC (MISCELLANEOUS) ×3 IMPLANT
CHLORAPREP W/TINT 26ML (MISCELLANEOUS) ×3 IMPLANT
CLIP APPLIE 5 13 M/L LIGAMAX5 (MISCELLANEOUS) ×2 IMPLANT
CLOTH BEACON ORANGE TIMEOUT ST (SAFETY) ×3 IMPLANT
COVER MAYO STAND STRL (DRAPES) IMPLANT
DECANTER SPIKE VIAL GLASS SM (MISCELLANEOUS) ×3 IMPLANT
DEVICE TROCAR PUNCTURE CLOSURE (ENDOMECHANICALS) ×9 IMPLANT
DRAPE C-ARM 42X72 X-RAY (DRAPES) IMPLANT
DRAPE LAPAROSCOPIC ABDOMINAL (DRAPES) ×3 IMPLANT
DRAPE UTILITY XL STRL (DRAPES) ×3 IMPLANT
ELECT REM PT RETURN 9FT ADLT (ELECTROSURGICAL) ×3
ELECTRODE REM PT RTRN 9FT ADLT (ELECTROSURGICAL) ×2 IMPLANT
FILTER SMOKE EVAC LAPAROSHD (FILTER) ×3 IMPLANT
GAUZE SPONGE 2X2 8PLY STRL LF (GAUZE/BANDAGES/DRESSINGS) ×2 IMPLANT
GLOVE BIO SURGEON STRL SZ7.5 (GLOVE) ×3 IMPLANT
GOWN STRL NON-REIN LRG LVL3 (GOWN DISPOSABLE) ×3 IMPLANT
GOWN STRL REIN XL XLG (GOWN DISPOSABLE) ×6 IMPLANT
HEMOSTAT SNOW SURGICEL 2X4 (HEMOSTASIS) ×3 IMPLANT
KIT BASIN OR (CUSTOM PROCEDURE TRAY) ×3 IMPLANT
NEEDLE INSUFFLATION 14GA 120MM (NEEDLE) ×3 IMPLANT
NS IRRIG 1000ML POUR BTL (IV SOLUTION) ×3 IMPLANT
RINGERS IRRIG 1000ML POUR BTL (IV SOLUTION) ×3 IMPLANT
SCISSORS LAP 5X35 DISP (ENDOMECHANICALS) ×3 IMPLANT
SET CHOLANGIOGRAPH MIX (MISCELLANEOUS) IMPLANT
SET IRRIG TUBING LAPAROSCOPIC (IRRIGATION / IRRIGATOR) ×3 IMPLANT
SOLUTION ANTI FOG 6CC (MISCELLANEOUS) ×3 IMPLANT
SPONGE GAUZE 2X2 STER 10/PKG (GAUZE/BANDAGES/DRESSINGS) ×1
SPONGE GAUZE 4X4 12PLY (GAUZE/BANDAGES/DRESSINGS) ×3 IMPLANT
STRIP CLOSURE SKIN 1/2X4 (GAUZE/BANDAGES/DRESSINGS) ×3 IMPLANT
SUT MNCRL AB 4-0 PS2 18 (SUTURE) ×3 IMPLANT
TOWEL OR 17X26 10 PK STRL BLUE (TOWEL DISPOSABLE) ×3 IMPLANT
TRAY LAP CHOLE (CUSTOM PROCEDURE TRAY) ×3 IMPLANT
TROCAR BLADELESS OPT 5 75 (ENDOMECHANICALS) ×6 IMPLANT
TROCAR XCEL NON-BLD 11X100MML (ENDOMECHANICALS) ×3 IMPLANT
TROCAR XCEL NON-BLD 5MMX100MML (ENDOMECHANICALS) ×3 IMPLANT
TUBING INSUFFLATION 10FT LAP (TUBING) ×3 IMPLANT

## 2012-08-20 NOTE — Anesthesia Procedure Notes (Signed)
Procedure Name: Intubation Date/Time: 08/20/2012 9:10 AM Performed by: Leroy Libman L Patient Re-evaluated:Patient Re-evaluated prior to inductionOxygen Delivery Method: Circle system utilized Preoxygenation: Pre-oxygenation with 100% oxygen Intubation Type: IV induction Ventilation: Mask ventilation without difficulty and Oral airway inserted - appropriate to patient size Laryngoscope Size: Hyacinth Meeker and 2 Grade View: Grade I Tube type: Oral Tube size: 7.5 mm Number of attempts: 1 Airway Equipment and Method: Stylet Placement Confirmation: ETT inserted through vocal cords under direct vision,  breath sounds checked- equal and bilateral and positive ETCO2 Secured at: 21 cm Tube secured with: Tape Dental Injury: Teeth and Oropharynx as per pre-operative assessment

## 2012-08-20 NOTE — Preoperative (Signed)
Beta Blockers   Reason not to administer Beta Blockers:Not Applicable 

## 2012-08-20 NOTE — Interval H&P Note (Signed)
History and Physical Interval Note:  08/20/2012 7:05 AM  Dorothy Burns  has presented today for surgery, with the diagnosis of gallstones  The various methods of treatment have been discussed with the patient and family. After consideration of risks, benefits and other options for treatment, the patient has consented to  Procedure(s) (LRB) with comments: LAPAROSCOPIC CHOLECYSTECTOMY WITH INTRAOPERATIVE CHOLANGIOGRAM (N/A) as a surgical intervention .  The patient's history has been reviewed, patient examined, no change in status, stable for surgery.  I have reviewed the patient's chart and labs.  Questions were answered to the patient's satisfaction.     Marigene Ehlers., Jed Limerick

## 2012-08-20 NOTE — Anesthesia Postprocedure Evaluation (Signed)
  Anesthesia Post-op Note  Patient: Dorothy Burns  Procedure(s) Performed: Procedure(s) (LRB): LAPAROSCOPIC CHOLECYSTECTOMY WITH INTRAOPERATIVE CHOLANGIOGRAM (N/A)  Patient Location: PACU  Anesthesia Type: General  Level of Consciousness: awake and alert   Airway and Oxygen Therapy: Patient Spontanous Breathing  Post-op Pain: mild  Post-op Assessment: Post-op Vital signs reviewed, Patient's Cardiovascular Status Stable, Respiratory Function Stable, Patent Airway and No signs of Nausea or vomiting  Last Vitals:  Filed Vitals:   08/20/12 1040  BP: 172/92  Pulse:   Temp: 36.5 C  Resp:     Post-op Vital Signs: stable   Complications: No apparent anesthesia complications

## 2012-08-20 NOTE — Progress Notes (Signed)
Pt is more awake. Ambulated around entire unit with help. Tolerated well. To BR. Unable to void. Nausea is improved. (s/p lap chole).

## 2012-08-20 NOTE — Transfer of Care (Signed)
Immediate Anesthesia Transfer of Care Note  Patient: Dorothy Burns  Procedure(s) Performed: Procedure(s) (LRB) with comments: LAPAROSCOPIC CHOLECYSTECTOMY WITH INTRAOPERATIVE CHOLANGIOGRAM (N/A)  Patient Location: PACU  Anesthesia Type:General  Level of Consciousness: sedated  Airway & Oxygen Therapy: Patient Spontanous Breathing and Patient connected to face mask oxygen  Post-op Assessment: Report given to PACU RN and Post -op Vital signs reviewed and stable  Post vital signs: Reviewed and stable  Complications: No apparent anesthesia complications

## 2012-08-20 NOTE — Op Note (Signed)
Pre Operative Diagnosis: sx gallstones  Post Operative Diagnosis: chronic cholecystitis, cirrhosis of the liver  Surgeon: Dr. Axel Filler   Procedure: Lap chole  Assistant: none  Anesthesia: Gen. Endotracheal anesthesia   EBL: 10cc  Complications:  Counts: reported as correct x 2   Findings: The patient had very nodular, cirrhotic liver.  Chronic cholecystitis  Indications for procedure: PT is a 57 y/o F with h/o RUQ pain.  She had a [place] which revealed stones and sx's c/w gallstones  Details of the procedure:  The patient was taken to the operating and placed in the supine position with bilateral SCDs in place. A time out was called and all facts were verified. A pneumoperitoneum was obtained via A Veress needle technique to a pressure of 14mm of mercury. A 5mm trochar was then placed in the right upper quadrant under visualization, and there were no injuries to any abdominal organs. A 11 mm port was then placed in the umbilical region after infiltrating with local anesthesia under direct visualization. A second and third epigastric port and right lower quadrant port placement under direct visualization, respectively. Secondarily to the fact the pt had a very enlarged and cirrhotic liver a 4th port was placed in the midline about 5-6cm from the epigastric port.  The gallbladder was identified and retracted, the peritoneum was then sharply dissected from the gallbladder and this dissection was carried down to Calot's triangle. The gallbladder was identified and stripped away circumferentially and seen going into the gallbladder 360. The biliary radicals as well as the cystic duct and common bile duct were seen free of filling defects.  2 clips were placed proximally one distally and the cystic duct transected. The cystic artery was identified and 2 clips placed proximally and one distally and transected.  We then proceeded to remove the gallbladder off the hepatic fossa with Bovie cautery.  An Endo Catch bag was then placed in the abdomen and gallbladder placed in the bag. The hepatic fossa was then reexamined and hemostasis was achieved with Bovie cautery and was excellent at the end of the case. The subhepatic fossa and perihepatic fossa was then irrigated until the effluent was clear. The 11 mm trocar fascia was reapproximated with the Endo Close #1 Vicryl x2. The pneumoperitoneum was evacuated and all trochars removed under direct visulalization.  The skin was then closed with 4-0 Monocryl and the skin dressed with Steri-Strips, gauze, and tape.  The patient was awaken from general anesthesia and taken to the recovery room in stable condition.

## 2012-08-20 NOTE — Anesthesia Preprocedure Evaluation (Addendum)
Anesthesia Evaluation  Patient identified by MRN, date of birth, ID band Patient awake    Reviewed: Allergy & Precautions, H&P , NPO status , Patient's Chart, lab work & pertinent test results  Airway Mallampati: I TM Distance: >3 FB Neck ROM: Full    Dental  (+) Edentulous Upper and Edentulous Lower   Pulmonary COPDCurrent Smoker,  breath sounds clear to auscultation  + decreased breath sounds      Cardiovascular hypertension, Pt. on medications Rhythm:Regular Rate:Normal     Neuro/Psych Bipolar Disorder negative neurological ROS     GI/Hepatic negative GI ROS, GERD-  Medicated,(+) Hepatitis -, C  Endo/Other  negative endocrine ROS  Renal/GU negative Renal ROS  negative genitourinary   Musculoskeletal negative musculoskeletal ROS (+)   Abdominal   Peds negative pediatric ROS (+)  Hematology negative hematology ROS (+)   Anesthesia Other Findings   Reproductive/Obstetrics negative OB ROS                          Anesthesia Physical Anesthesia Plan  ASA: III  Anesthesia Plan: General   Post-op Pain Management:    Induction: Intravenous  Airway Management Planned: Oral ETT  Additional Equipment:   Intra-op Plan:   Post-operative Plan: Extubation in OR  Informed Consent: I have reviewed the patients History and Physical, chart, labs and discussed the procedure including the risks, benefits and alternatives for the proposed anesthesia with the patient or authorized representative who has indicated his/her understanding and acceptance.   Dental advisory given  Plan Discussed with: CRNA and Surgeon  Anesthesia Plan Comments:         Anesthesia Quick Evaluation

## 2012-08-20 NOTE — H&P (View-Only) (Signed)
Patient ID: Dorothy Burns, female   DOB: 05/27/1956, 57 y.o.   MRN: [id number]  No chief complaint on file.   HPI Dorothy Burns is a 57 y.o. female.  The patient is a 57 year old female who describes approximately one week history of right upper quadrant pain. The patient presented to the ED was evaluated with an ultrasound which revealed multiple gallstones within the gallbladder.the patient alkaline phosphatase of 125. Her LFTs were otherwise normal.the patient describes nausea vomiting after meals which does resolve some of her pain. HPI  Past Medical History  Diagnosis Date  . Hypertension   . Bipolar 1 disorder   . Menorrhagia   . Post-menopausal bleeding   . Paresthesia   . COPD (chronic obstructive pulmonary disease)   . H/O: substance abuse     IVDU cocaine last 38  . Gout   . Mental disorder     bipolar  . Asthma   . Shortness of breath   . Anemia   . Hepatitis     Hep C x 10 yrs    Past Surgical History  Procedure Date  . Tubal ligation   . Facial reconstruction surgery   . Multiple tooth extractions   . Hysteroscopy w/d&c 09/29/2011    Procedure: DILATATION AND CURETTAGE /HYSTEROSCOPY;  Surgeon: Catalina Antigua, MD;  Location: [facility];  Service: Gynecology;  Laterality: N/A;    Family History  Problem Relation Age of Onset  . COPD Mother   . Diabetes Mother   . Heart disease Mother   . Diabetes Father   . Heart disease Father   . Hypertension Father     Social History History  Substance Use Topics  . Smoking status: Current Every Day Smoker -- 2.0 packs/day for 30 years    Types: Cigarettes  . Smokeless tobacco: Never Used     Comment: in process of quitting---would like nicotine patch if insurance covers it  . Alcohol Use: No     Comment: recovering alcoholic    No Known Allergies  Current Outpatient Prescriptions  Medication Sig Dispense Refill  . sulfamethoxazole-trimethoprim (BACTRIM DS) 800-160 MG per tablet Take 1 tablet by mouth 2 (two)  times daily.      . budesonide-formoterol (SYMBICORT) 160-4.5 MCG/ACT inhaler Inhale 2 puffs into the lungs 2 (two) times daily.  1 Inhaler  3  . clonazePAM (KLONOPIN) 1 MG tablet Take 1 mg by mouth 3 (three) times daily as needed. For anxiety      . HYDROcodone-acetaminophen (NORCO/VICODIN) 5-325 MG per tablet Take 1 tablet by mouth every 6 (six) hours as needed. For pain      . indomethacin (INDOCIN) 25 MG capsule Take 25 mg by mouth daily.      Marland Kitchen lithium 300 MG capsule Take 300-600 mg by mouth 3 (three) times daily with meals. Pt takes 2 capsules in the morning, 1 midday, and 2 at bedtime      . oxyCODONE-acetaminophen (PERCOCET/ROXICET) 5-325 MG per tablet Take 1-2 tablets by mouth every 4 (four) hours as needed for pain.  20 tablet  0  . pregabalin (LYRICA) 75 MG capsule Take 75 mg by mouth 2 (two) times daily.      . traMADol (ULTRAM) 50 MG tablet Take 50 mg by mouth every 8 (eight) hours as needed. For pain      . zolpidem (AMBIEN) 5 MG tablet Take 5 mg by mouth at bedtime.         Review of Systems Review of  Systems  HENT: Negative.   Respiratory: Negative.   Cardiovascular: Negative.   Gastrointestinal: Negative.   Neurological: Negative.     Blood pressure 110/70, pulse 84, temperature 96.8 F (36 C), temperature source Temporal, resp. rate 18, height 5\' 3"  (1.6 m), weight 161 lb 9.6 oz (73.301 kg), last menstrual period 04/29/2011.  Physical Exam Physical Exam  Constitutional: She is oriented to person, place, and time. She appears well-developed and well-nourished.  HENT:  Head: Normocephalic and atraumatic.  Eyes: Conjunctivae normal are normal. Pupils are equal, round, and reactive to light.  Neck: Normal range of motion.  Cardiovascular: Normal rate, regular rhythm and normal heart sounds.   Pulmonary/Chest: Effort normal and breath sounds normal.  Abdominal: Soft. Bowel sounds are normal. There is tenderness (ruq).  Musculoskeletal: Normal range of motion.   Neurological: She is alert and oriented to person, place, and time.    Data Reviewed Ultrasound revealed multiple gallstones  elevated alkaline phosphatase, Normal T. Bili and LFTs  Assessment    57 year old female with symptomatic cholelithiasis.    Plan    1. We'll proceed to 100 for laparoscopic cholecystectomy with intraoperative cholangiogram.  2.All risks and benefits were discussed with the patient, to generally include infection, bleeding, damage to surrounding structures, and recurrence. Alternatives were offered and described.  All questions were answered and the patient voiced understanding of the procedure and wishes to proceed at this point.        Marigene Ehlers., Arvel Oquinn 08/05/2012, 9:18 AM

## 2012-08-23 ENCOUNTER — Encounter (HOSPITAL_COMMUNITY): Payer: Self-pay | Admitting: General Surgery

## 2012-09-03 ENCOUNTER — Encounter (INDEPENDENT_AMBULATORY_CARE_PROVIDER_SITE_OTHER): Payer: Self-pay | Admitting: General Surgery

## 2012-09-03 ENCOUNTER — Encounter (INDEPENDENT_AMBULATORY_CARE_PROVIDER_SITE_OTHER): Payer: Medicare Other | Admitting: General Surgery

## 2012-09-03 ENCOUNTER — Ambulatory Visit (INDEPENDENT_AMBULATORY_CARE_PROVIDER_SITE_OTHER): Payer: Medicare Other | Admitting: General Surgery

## 2012-09-03 VITALS — BP 122/82 | HR 80 | Temp 98.6°F | Ht 63.0 in | Wt 162.2 lb

## 2012-09-03 DIAGNOSIS — Z9889 Other specified postprocedural states: Secondary | ICD-10-CM

## 2012-09-03 DIAGNOSIS — Z9049 Acquired absence of other specified parts of digestive tract: Secondary | ICD-10-CM

## 2012-09-03 NOTE — Progress Notes (Signed)
Patient ID: Dorothy Burns, female   DOB: [birth year], 57 y.o.   MRN: [id number] The patient is a 57 year old female status post laparoscope cholecystectomy.  The patient is to do well postoperatively. She did have been normal diet tolerating that well with normal bowel function.   On exam: Abdomen-soft wound clean dry and intact  Pathology: Reveals chronic cholecystitis. This was discussed with patient as well as signs of cirrhosis intraoperatively.  Assessment and plan: 57 year old female status post left upper cholecystectomy with liver cirrhosis The patient in followup when necessary

## 2012-12-22 ENCOUNTER — Encounter (HOSPITAL_COMMUNITY): Payer: Self-pay | Admitting: Emergency Medicine

## 2012-12-22 ENCOUNTER — Emergency Department (HOSPITAL_COMMUNITY)
Admission: EM | Admit: 2012-12-22 | Discharge: 2012-12-22 | Disposition: A | Discharge status: Home / Self Care | Payer: Medicare Other | Attending: Emergency Medicine | Admitting: Emergency Medicine

## 2012-12-22 DIAGNOSIS — Z862 Personal history of diseases of the blood and blood-forming organs and certain disorders involving the immune mechanism: Secondary | ICD-10-CM | POA: Insufficient documentation

## 2012-12-22 DIAGNOSIS — I10 Essential (primary) hypertension: Secondary | ICD-10-CM | POA: Insufficient documentation

## 2012-12-22 DIAGNOSIS — F319 Bipolar disorder, unspecified: Secondary | ICD-10-CM | POA: Insufficient documentation

## 2012-12-22 DIAGNOSIS — Z8639 Personal history of other endocrine, nutritional and metabolic disease: Secondary | ICD-10-CM | POA: Insufficient documentation

## 2012-12-22 DIAGNOSIS — J449 Chronic obstructive pulmonary disease, unspecified: Secondary | ICD-10-CM | POA: Insufficient documentation

## 2012-12-22 DIAGNOSIS — IMO0002 Reserved for concepts with insufficient information to code with codable children: Secondary | ICD-10-CM | POA: Insufficient documentation

## 2012-12-22 DIAGNOSIS — Z8669 Personal history of other diseases of the nervous system and sense organs: Secondary | ICD-10-CM | POA: Insufficient documentation

## 2012-12-22 DIAGNOSIS — Z8719 Personal history of other diseases of the digestive system: Secondary | ICD-10-CM | POA: Insufficient documentation

## 2012-12-22 DIAGNOSIS — Z79899 Other long term (current) drug therapy: Secondary | ICD-10-CM | POA: Insufficient documentation

## 2012-12-22 DIAGNOSIS — Z8659 Personal history of other mental and behavioral disorders: Secondary | ICD-10-CM | POA: Insufficient documentation

## 2012-12-22 DIAGNOSIS — B182 Chronic viral hepatitis C: Secondary | ICD-10-CM | POA: Insufficient documentation

## 2012-12-22 DIAGNOSIS — Z8742 Personal history of other diseases of the female genital tract: Secondary | ICD-10-CM | POA: Insufficient documentation

## 2012-12-22 DIAGNOSIS — F17200 Nicotine dependence, unspecified, uncomplicated: Secondary | ICD-10-CM | POA: Insufficient documentation

## 2012-12-22 DIAGNOSIS — M129 Arthropathy, unspecified: Secondary | ICD-10-CM | POA: Insufficient documentation

## 2012-12-22 DIAGNOSIS — Z8619 Personal history of other infectious and parasitic diseases: Secondary | ICD-10-CM | POA: Insufficient documentation

## 2012-12-22 DIAGNOSIS — J4489 Other specified chronic obstructive pulmonary disease: Secondary | ICD-10-CM | POA: Insufficient documentation

## 2012-12-22 DIAGNOSIS — F1490 Cocaine use, unspecified, uncomplicated: Secondary | ICD-10-CM

## 2012-12-22 DIAGNOSIS — Z8739 Personal history of other diseases of the musculoskeletal system and connective tissue: Secondary | ICD-10-CM | POA: Insufficient documentation

## 2012-12-22 DIAGNOSIS — F1410 Cocaine abuse, uncomplicated: Secondary | ICD-10-CM | POA: Insufficient documentation

## 2012-12-22 DIAGNOSIS — K219 Gastro-esophageal reflux disease without esophagitis: Secondary | ICD-10-CM | POA: Insufficient documentation

## 2012-12-22 LAB — CBC WITH DIFFERENTIAL/PLATELET
Basophils Absolute: 0 10*3/uL (ref 0.0–0.1)
Basophils Relative: 1 % (ref 0–1)
Hemoglobin: 11.5 g/dL — ABNORMAL LOW (ref 12.0–15.0)
Lymphocytes Relative: 14 % (ref 12–46)
MCHC: 32.4 g/dL (ref 30.0–36.0)
Monocytes Relative: 9 % (ref 3–12)
Neutro Abs: 2.7 10*3/uL (ref 1.7–7.7)
Neutrophils Relative %: 70 % (ref 43–77)
WBC: 3.9 10*3/uL — ABNORMAL LOW (ref 4.0–10.5)

## 2012-12-22 LAB — COMPREHENSIVE METABOLIC PANEL
AST: 43 U/L — ABNORMAL HIGH (ref 0–37)
Albumin: 2.8 g/dL — ABNORMAL LOW (ref 3.5–5.2)
Alkaline Phosphatase: 153 U/L — ABNORMAL HIGH (ref 39–117)
BUN: 16 mg/dL (ref 6–23)
Chloride: 106 mEq/L (ref 96–112)
Potassium: 3.7 mEq/L (ref 3.5–5.1)
Total Bilirubin: 0.5 mg/dL (ref 0.3–1.2)

## 2012-12-22 LAB — RAPID URINE DRUG SCREEN, HOSP PERFORMED
Barbiturates: NOT DETECTED
Tetrahydrocannabinol: NOT DETECTED

## 2012-12-22 MED ORDER — ACETAMINOPHEN 325 MG PO TABS: 650.0000 mg | ORAL_TABLET | ORAL | Status: DC | PRN

## 2012-12-22 MED ORDER — ALUM & MAG HYDROXIDE-SIMETH 200-200-20 MG/5ML PO SUSP: 30.0000 mL | ORAL | Status: DC | PRN

## 2012-12-22 MED ORDER — LORAZEPAM 1 MG PO TABS: 1.0000 mg | ORAL_TABLET | Freq: Three times a day (TID) | ORAL | Status: DC | PRN

## 2012-12-22 MED ORDER — IBUPROFEN 600 MG PO TABS: 600.0000 mg | ORAL_TABLET | Freq: Three times a day (TID) | ORAL | Status: DC | PRN

## 2012-12-22 MED ORDER — NICOTINE 21 MG/24HR TD PT24
21.0000 mg | MEDICATED_PATCH | Freq: Once | TRANSDERMAL | Status: DC
  Administered 2012-12-22: 21 mg via TRANSDERMAL
  Filled 2012-12-22: qty 1

## 2012-12-22 NOTE — ED Notes (Signed)
Patient reports that she has been using crack cocaine for at least 12 years, before which she was sober for five years, before which she used crack for a "long time." Patient reports that she last used crack last night. Patient reports nausea and emesis lately. Family of patient reports emotional lability in patient.

## 2012-12-22 NOTE — ED Provider Notes (Addendum)
History    CSN: [id number] Arrival date & time 12/22/12  1656 First MD Initiated Contact with Patient 12/22/12 1700     Chief Complaint  Patient presents with  . Medical Clearance   HPI Patient presents to emergency room with complaints of chronic crack cocaine abuse. The patient states she uses crack cocaine regularly. She has been using for the last 4 years. Her last use was last night. Patient has been communicating with her outpatient mental health provider. She is not able to stop her substance abuse. She was told to come to the emergency room to trying to get into a treatment program. She denies any alcohol abuse. She denies any other complaints. She does note that she has easy bruising to her skin. She does have a history of hepatitis C.  Past Medical History  Diagnosis Date  . Hypertension   . Bipolar 1 disorder   . Menorrhagia   . Post-menopausal bleeding     MOST RECENT BLEEDING  WAS NOV 2013  . Paresthesia   . COPD (chronic obstructive pulmonary disease)   . 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 Gout   . Mental disorder     bipolar  . Asthma   . Shortness of breath   . Anemia   . GERD (gastroesophageal reflux disease)   . Gallstones     ABD PAIN, N&V  . Arthritis   . Headache   . Hepatitis     Hep C x 10 yrs  - NO TREATMENT    Past Surgical History  Procedure Laterality Date  . Tubal ligation    . Facial reconstruction surgery    . Multiple tooth extractions    . Hysteroscopy w/d&c  09/29/2011    Procedure: DILATATION AND CURETTAGE /HYSTEROSCOPY;  Surgeon: Catalina Antigua, MD;  Location: [facility];  Service: Gynecology;  Laterality: N/A;  . Cholecystectomy  08/20/2012    Procedure: LAPAROSCOPIC CHOLECYSTECTOMY;  Surgeon: Axel Filler, MD;  Location: [facility];  Service: General;;    Family History  Problem Relation Age of Onset  . COPD Mother   . Diabetes Mother   . Heart disease Mother   . Diabetes Father   . Heart disease Father   .  Hypertension Father     History  Substance Use Topics  . Smoking status: Current Every Day Smoker -- 2.00 packs/day for 30 years    Types: Cigarettes  . Smokeless tobacco: Never Used     Comment: in process of quitting---would like nicotine patch if insurance covers it  . Alcohol Use: No     Comment: recovering alcoholic    NO COCAINE SINCE 1990    OB History   Grav Para Term Preterm Abortions TAB SAB Ect Mult Living   8 4 4  0 4 1 3   4       Review of Systems  All other systems reviewed and are negative.    Allergies  Review of patient's allergies indicates no known allergies.  Home Medications   Current Outpatient Rx  Name  Route  Sig  Dispense  Refill  . budesonide-formoterol (SYMBICORT) 160-4.5 MCG/ACT inhaler   Inhalation   Inhale 2 puffs into the lungs 2 (two) times daily.   1 Inhaler   3   . clonazePAM (KLONOPIN) 1 MG tablet   Oral   Take 1 mg by mouth 4 (four) times daily as needed for anxiety.          Marland Kitchen  furosemide (LASIX) 20 MG tablet   Oral   Take 20 mg by mouth daily.         . hydrochlorothiazide (HYDRODIURIL) 25 MG tablet   Oral   Take 25 mg by mouth at bedtime.          Marland Kitchen HYDROcodone-acetaminophen (NORCO/VICODIN) 5-325 MG per tablet   Oral   Take 1 tablet by mouth every 6 (six) hours as needed for pain.         . indomethacin (INDOCIN) 25 MG capsule   Oral   Take 25 mg by mouth 3 (three) times daily as needed (for gout).         Marland Kitchen lithium 300 MG capsule   Oral   Take 300-600 mg by mouth 3 (three) times daily with meals. Pt takes 2 capsules in the morning, 1 midday, and 2 at bedtime         . pregabalin (LYRICA) 75 MG capsule   Oral   Take 75 mg by mouth 2 (two) times daily.         . traMADol (ULTRAM) 50 MG tablet   Oral   Take 50 mg by mouth every 8 (eight) hours as needed for pain.          Marland Kitchen zolpidem (AMBIEN) 10 MG tablet   Oral   Take 10 mg by mouth at bedtime.           BP 133/74  Pulse 73  Temp(Src) 98  F (36.7 C)  Resp 14  Ht 5\' 3"  (1.6 m)  Wt 160 lb (72.576 kg)  BMI 28.35 kg/m2  SpO2 99%  Physical Exam  Nursing note and vitals reviewed. Constitutional: She appears well-developed and well-nourished. No distress.  HENT:  Head: Normocephalic and atraumatic.  Right Ear: External ear normal.  Left Ear: External ear normal.  Eyes: Conjunctivae are normal. Right eye exhibits no discharge. Left eye exhibits no discharge. No scleral icterus.  Neck: Neck supple. No tracheal deviation present.  Cardiovascular: Normal rate, regular rhythm and intact distal pulses.   Pulmonary/Chest: Effort normal and breath sounds normal. No stridor. No respiratory distress. She has no wheezes. She has no rales.  Abdominal: Soft. Bowel sounds are normal. She exhibits no distension. There is no tenderness. There is no rebound and no guarding.  Musculoskeletal: She exhibits no edema and no tenderness.  Neurological: She is alert. She has normal strength. No sensory deficit. Cranial nerve deficit:  no gross defecits noted. She exhibits normal muscle tone. She displays no seizure activity. Coordination normal.  Skin: Skin is warm and dry. Bruising (multiple small areas noted on extremities) noted. No petechiae, no purpura and no rash noted.  Psychiatric: She has a normal mood and affect.    ED Course  Procedures (including critical care time)  Labs Reviewed  CBC WITH DIFFERENTIAL - Abnormal; Notable for the following:    WBC 3.9 (*)    Hemoglobin 11.5 (*)    HCT 35.5 (*)    Lymphs Abs 0.6 (*)    Eosinophils Relative 7 (*)    All other components within normal limits  COMPREHENSIVE METABOLIC PANEL - Abnormal; Notable for the following:    Glucose, Bld 185 (*)    Albumin 2.8 (*)    AST 43 (*)    Alkaline Phosphatase 153 (*)    GFR calc non Af Amer 69 (*)    GFR calc Af Amer 80 (*)    All other components within normal limits  ETHANOL  URINE RAPID DRUG SCREEN (HOSP PERFORMED)   No results  found.   1. Crack cocaine use   2. Hepatitis C, chronic       MDM  The patient appears medically stable. She has mild abnormalities noted on her left eye panel and CBC that this appears to be similar to her baseline. Patient chronically does have mild thrombocytopenia as well as elevation of LFTs most likely related to her hepatitis C .  7:17 PM the patient will be moved to the psychiatric ED to see if she is a candidate for any treatment program regarding her crack cocaine abuse.        Celene Kras, MD 12/22/12 1917   Pt was assessed by [facility].  She is not a candidate for inpatient treatment.  Will be given outpatient resources.  Celene Kras, MD 12/22/12 2126

## 2012-12-22 NOTE — ED Notes (Addendum)
Pt. Family members took all pts jewelry home with them. She had a ring, necklace, and bracelets. Pt. Belongings are under the desk on the yellow side.
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
## 2012-12-22 NOTE — ED Notes (Signed)
Patient discharge with no acute distress noted 

## 2013-01-04 ENCOUNTER — Encounter (HOSPITAL_COMMUNITY): Payer: Self-pay

## 2013-01-04 ENCOUNTER — Emergency Department (HOSPITAL_COMMUNITY)
Admission: EM | Admit: 2013-01-04 | Discharge: 2013-01-04 | Disposition: A | Discharge status: Home / Self Care | Payer: Medicare Other | Attending: Emergency Medicine | Admitting: Emergency Medicine

## 2013-01-04 DIAGNOSIS — J45909 Unspecified asthma, uncomplicated: Secondary | ICD-10-CM | POA: Insufficient documentation

## 2013-01-04 DIAGNOSIS — F17200 Nicotine dependence, unspecified, uncomplicated: Secondary | ICD-10-CM | POA: Insufficient documentation

## 2013-01-04 DIAGNOSIS — Z0000 Encounter for general adult medical examination without abnormal findings: Secondary | ICD-10-CM | POA: Insufficient documentation

## 2013-01-04 DIAGNOSIS — J449 Chronic obstructive pulmonary disease, unspecified: Secondary | ICD-10-CM | POA: Insufficient documentation

## 2013-01-04 DIAGNOSIS — F489 Nonpsychotic mental disorder, unspecified: Secondary | ICD-10-CM | POA: Insufficient documentation

## 2013-01-04 DIAGNOSIS — Z8669 Personal history of other diseases of the nervous system and sense organs: Secondary | ICD-10-CM | POA: Insufficient documentation

## 2013-01-04 DIAGNOSIS — Z8739 Personal history of other diseases of the musculoskeletal system and connective tissue: Secondary | ICD-10-CM | POA: Insufficient documentation

## 2013-01-04 DIAGNOSIS — Z8639 Personal history of other endocrine, nutritional and metabolic disease: Secondary | ICD-10-CM | POA: Insufficient documentation

## 2013-01-04 DIAGNOSIS — F319 Bipolar disorder, unspecified: Secondary | ICD-10-CM | POA: Insufficient documentation

## 2013-01-04 DIAGNOSIS — J4489 Other specified chronic obstructive pulmonary disease: Secondary | ICD-10-CM | POA: Insufficient documentation

## 2013-01-04 DIAGNOSIS — Z862 Personal history of diseases of the blood and blood-forming organs and certain disorders involving the immune mechanism: Secondary | ICD-10-CM | POA: Insufficient documentation

## 2013-01-04 DIAGNOSIS — I10 Essential (primary) hypertension: Secondary | ICD-10-CM | POA: Insufficient documentation

## 2013-01-04 DIAGNOSIS — Z8719 Personal history of other diseases of the digestive system: Secondary | ICD-10-CM | POA: Insufficient documentation

## 2013-01-04 DIAGNOSIS — Z8742 Personal history of other diseases of the female genital tract: Secondary | ICD-10-CM | POA: Insufficient documentation

## 2013-01-04 DIAGNOSIS — Z79899 Other long term (current) drug therapy: Secondary | ICD-10-CM | POA: Insufficient documentation

## 2013-01-04 NOTE — Discharge Instructions (Signed)
Health Maintenance, Females A healthy lifestyle and preventative care can promote health and wellness.  Maintain regular health, dental, and eye exams.  Eat a healthy diet. Foods like vegetables, fruits, whole grains, low-fat dairy products, and lean protein foods contain the nutrients you need without too many calories. Decrease your intake of foods high in solid fats, added sugars, and salt. Get information about a proper diet from your caregiver, if necessary.  Regular physical exercise is one of the most important things you can do for your health. Most adults should get at least 150 minutes of moderate-intensity exercise (any activity that increases your heart rate and causes you to sweat) each week. In addition, most adults need muscle-strengthening exercises on 2 or more days a week.   Maintain a healthy weight. The body mass index (BMI) is a screening tool to identify possible weight problems. It provides an estimate of body fat based on height and weight. Your caregiver can help determine your BMI, and can help you achieve or maintain a healthy weight. For adults 20 years and older:  A BMI below 18.5 is considered underweight.  A BMI of 18.5 to 24.9 is normal.  A BMI of 25 to 29.9 is considered overweight.  A BMI of 30 and above is considered obese.  Maintain normal blood lipids and cholesterol by exercising and minimizing your intake of saturated fat. Eat a balanced diet with plenty of fruits and vegetables. Blood tests for lipids and cholesterol should begin at age 25 and be repeated every 5 years. If your lipid or cholesterol levels are high, you are over 50, or you are a high risk for heart disease, you may need your cholesterol levels checked more frequently.Ongoing high lipid and cholesterol levels should be treated with medicines if diet and exercise are not effective.  If you smoke, find out from your caregiver how to quit. If you do not use tobacco, do not start.  If you  are pregnant, do not drink alcohol. If you are breastfeeding, be very cautious about drinking alcohol. If you are not pregnant and choose to drink alcohol, do not exceed 1 drink per day. One drink is considered to be 12 ounces (355 mL) of beer, 5 ounces (148 mL) of wine, or 1.5 ounces (44 mL) of liquor.  Avoid use of street drugs. Do not share needles with anyone. Ask for help if you need support or instructions about stopping the use of drugs.  High blood pressure causes heart disease and increases the risk of stroke. Blood pressure should be checked at least every 1 to 2 years. Ongoing high blood pressure should be treated with medicines, if weight loss and exercise are not effective.  If you are 12 to 57 years old, ask your caregiver if you should take aspirin to prevent strokes.  Diabetes screening involves taking a blood sample to check your fasting blood sugar level. This should be done once every 3 years, after age 83, if you are within normal weight and without risk factors for diabetes. Testing should be considered at a younger age or be carried out more frequently if you are overweight and have at least 1 risk factor for diabetes.  Breast cancer screening is essential preventative care for women. You should practice "breast self-awareness." This means understanding the normal appearance and feel of your breasts and may include breast self-examination. Any changes detected, no matter how small, should be reported to a caregiver. Women in their 94s and 30s should have  a clinical breast exam (CBE) by a caregiver as part of a regular health exam every 1 to 3 years. After age 68, women should have a CBE every year. Starting at age 24, women should consider having a mammogram (breast X-ray) every year. Women who have a family history of breast cancer should talk to their caregiver about genetic screening. Women at a high risk of breast cancer should talk to their caregiver about having an MRI and a  mammogram every year.  The Pap test is a screening test for cervical cancer. Women should have a Pap test starting at age 49. Between ages 66 and 18, Pap tests should be repeated every 2 years. Beginning at age 75, you should have a Pap test every 3 years as long as the past 3 Pap tests have been normal. If you had a hysterectomy for a problem that was not cancer or a condition that could lead to cancer, then you no longer need Pap tests. If you are between ages 1 and 97, and you have had normal Pap tests going back 10 years, you no longer need Pap tests. If you have had past treatment for cervical cancer or a condition that could lead to cancer, you need Pap tests and screening for cancer for at least 20 years after your treatment. If Pap tests have been discontinued, risk factors (such as a new sexual partner) need to be reassessed to determine if screening should be resumed. Some women have medical problems that increase the chance of getting cervical cancer. In these cases, your caregiver may recommend more frequent screening and Pap tests.  The human papillomavirus (HPV) test is an additional test that may be used for cervical cancer screening. The HPV test looks for the virus that can cause the cell changes on the cervix. The cells collected during the Pap test can be tested for HPV. The HPV test could be used to screen women aged 61 years and older, and should be used in women of any age who have unclear Pap test results. After the age of 79, women should have HPV testing at the same frequency as a Pap test.  Colorectal cancer can be detected and often prevented. Most routine colorectal cancer screening begins at the age of 37 and continues through age 57. However, your caregiver may recommend screening at an earlier age if you have risk factors for colon cancer. On a yearly basis, your caregiver may provide home test kits to check for hidden blood in the stool. Use of a small camera at the end of a  tube, to directly examine the colon (sigmoidoscopy or colonoscopy), can detect the earliest forms of colorectal cancer. Talk to your caregiver about this at age 17, when routine screening begins. Direct examination of the colon should be repeated every 5 to 10 years through age 16, unless early forms of pre-cancerous polyps or small growths are found.  Hepatitis C blood testing is recommended for all people born from [birth year] through 1965 and any individual with known risks for hepatitis C.  Practice safe sex. Use condoms and avoid high-risk sexual practices to reduce the spread of sexually transmitted infections (STIs). Sexually active women aged 12 and younger should be checked for Chlamydia, which is a common sexually transmitted infection. Older women with new or multiple partners should also be tested for Chlamydia. Testing for other STIs is recommended if you are sexually active and at increased risk.  Osteoporosis is a disease in which the  bones lose minerals and strength with aging. This can result in serious bone fractures. The risk of osteoporosis can be identified using a bone density scan. Women ages 30 and over and women at risk for fractures or osteoporosis should discuss screening with their caregivers. Ask your caregiver whether you should be taking a calcium supplement or vitamin D to reduce the rate of osteoporosis.  Menopause can be associated with physical symptoms and risks. Hormone replacement therapy is available to decrease symptoms and risks. You should talk to your caregiver about whether hormone replacement therapy is right for you.  Use sunscreen with a sun protection factor (SPF) of 30 or greater. Apply sunscreen liberally and repeatedly throughout the day. You should seek shade when your shadow is shorter than you. Protect yourself by wearing long sleeves, pants, a wide-brimmed hat, and sunglasses year round, whenever you are outdoors.  Notify your caregiver of new moles or  changes in moles, especially if there is a change in shape or color. Also notify your caregiver if a mole is larger than the size of a pencil eraser.  Stay current with your immunizations. Document Released: 01/20/2011 Document Revised: 09/29/2011 Document Reviewed: 01/20/2011 [facility] Patient Information 2014 [place], [place].  Medical Screening Exam A medical screening exam has been done. This exam helps find the cause of your problem and determines whether you need emergency treatment. Your exam has shown that you do not need emergency treatment at this point. It is safe for you to go to your caregiver's office or clinic for treatment. You should make an appointment today to see your caregiver as soon as he or she is available. Depending on your illness, your symptoms and condition can change over time. If your condition gets worse or you develop new or troubling symptoms before you see your caregiver, you should return to the emergency department for further evaluation.  Document Released: 08/14/2004 Document Revised: 09/29/2011 Document Reviewed: 03/26/2011 [facility] Patient Information 2014 [place], [place].  RESOURCE GUIDE  Chronic Pain Problems: Contact [facility]  [phone] Patients need to be referred by their primary care doctor.  Insufficient Money for Medicine: Contact United Way:  call "211."   No Primary Care Doctor: - Call Health Connect  [phone] - can help you locate a primary care doctor that  accepts your insurance, provides certain services, etc. - Physician Referral Service- [phone]  Agencies that provide inexpensive medical care: - [facility]  [phone] - [facility]  [phone] - [facility]  [phone] - Women's Clinic  [phone] - Planned Parenthood  [phone] [facility]  [phone]  Medicaid-accepting [facility] Providers: - [facility]- [address],  [address]  [phone], Mon-Fri 9am-7pm, Sat 9am-1pm - [facility]- [address] [place], Suite [place]  [phone] - [facility]- [address] [place]  [phone] [facility]- [address]  [phone] - Renaye Rakers- [address] [place], [address], [phone]  Only accepts [place] patients after they have their name  applied to their card  Self Pay (no insurance) in [place]: - Sickle Cell Patients - [facility]  [address] [place], [phone] - [facility]- [address] [place]  [phone]       [facility]- [address]       -  [facility]- see information above (Speak to [employer] if you do not have insurance)       -  [facility]- [address],  [phone]       -  [facility]- [address], [phone]       -  Dr Julio Sicks-  [address], [place], [phone]       -  [facility] - [address] [phone]       -  [facility]- [address] [phone], also [address], [phone]       -     [facility]- [address] [place], [phone], 1st & 3rd Saturday         every month, 10am-1pm  -     [facility]   [address] [place], [place].   Phone:  [phone], Fax:  [phone]. Hours of Operation:  9 am - 6 pm, M-F.  -     [facility]   [address], [place]   Phone: [phone], Fax: [phone]. Hours of Operation:  8:30 am - 5:30 pm, M-F.  [facility] [address] [place], [place] [address] [phone]  [facility] [address] [place], [place] [address] [phone]  1) Find a Doctor and Pay Out of Pocket Although you won't have to find out who is covered by your insurance plan, it is a good idea to ask around  and get recommendations. You will then need to call the office and see if the doctor you have chosen will accept you as a new patient and what types of options they offer for patients who are self-pay. Some doctors offer discounts or will set up payment plans for their patients who do not have insurance, but you will need to ask so you aren't surprised when you get to your appointment.  2) Contact Your Local Health Department Not all health departments have doctors that can see patients for sick visits, but many do, so it is worth a call to see if yours does. If you don't know where your local health department is, you can check in your phone book. The CDC also has a tool to help you locate your state's health department, and many state websites also have listings of all of their local health departments.  3) Find a Walk-in Clinic If your illness is not likely to be very severe or complicated, you may want to try a walk in clinic. These are popping up all over the country in pharmacies, drugstores, and shopping centers. They're usually staffed by nurse practitioners or physician assistants that have been trained to treat common illnesses and complaints. They're usually fairly quick and inexpensive. However, if you have serious medical issues or chronic medical problems, these are probably not your best option  STD Testing - [facility] Department of [facility] [place], STD Clinic, [address] [place], phone [phone] or [phone].  Monday - Friday, call for an appointment. [facility] Department of [employer], STD Clinic, [place] [address], [place], phone [phone] or [phone].  Monday - Friday, call for an appointment.  Abuse/Neglect: [facility] Child Abuse Hotline (415)383-2365 [facility] Child Abuse Hotline (507)326-9549 (After Hours)  Emergency Shelter:  [facility] 743-225-1738  Maternity Homes: -  Room at the  [facility] [phone] - [facility] [phone]  MRSA Hotline #:   343-228-8711  Dental Assistance If unable to pay or uninsured, contact:  [facility] to become qualified for the adult dental clinic.  Patients with Medicaid: [facility] [phone] [address], [phone] [address], [phone]  If unable to pay, or uninsured, contact [facility] [phone] in [place], [phone] in [facility]) to become qualified for the adult dental clinic  [facility] [address] [place], [place] [address] [phone] [email or web address]  Other Proofreader Services: - [facility]- [address] [place], [place], [place], [address], [phone], Ext. 123, 2nd and 4th Thursday of the month at 6:30am.  10 clients each day by appointment, can sometimes see walk-in patients if someone does not show for an appointment. [facility]- [address] [place], [place], [address], [phone] - [facility] [address] [place], [place], [address], [phone] - [facility]- [phone] [facility] Health Department- [phone] [facility] Health [phone]       Behavioral Health Resources in the [facility]  Intensive Outpatient Programs: [facility]      [address] [place], [place] [phone] Both a day and evening program       [facility] Outpatient     [address]        [place], [place] [address] [phone]         ADS: [facility] [address] [place] [phone]  [facility] Mental Health ACCESS LINE: (212)[phone] or [phone] [address] [place], [place] [address] [email or web address]   Substance Abuse Resources: - [facility]  534-647-3231 - [facility] [phone] - [facility] [place] [phone] Floydene Flock [phone] - Residential & Outpatient Substance Abuse Program  [phone]  Psychological Services: [facility]  [phone] Outpatient [facility]  [phone] - [facility], [phone] [place]. [address], [place], ACCESS LINE: (803)494-0805 or [phone], [email or web address]  Mobile Crisis Teams:                                        Therapeutic Alternatives         Mobile Crisis Care Unit [phone]             [facility] [address] Ginette Otto [phone]                                         Interventionist [address] [place] [address] [place] [phone]  Self-Help/Support Groups: Mental Health Assoc. of [employer] of support groups 204-545-6433 (call for more info)  Narcotics Anonymous (NA) [facility] [address] [place] - 2 meetings at this location  Residential Treatment Programs:  [facility]      [address]        [place]       [phone]         [facility] [address], [place] [address] [place], [place]  [address] [phone]  [facility] Facility  [address] [place],  [place] [address] [phone] Admissions: 8am-3pm M-F  [facility]     [address]        [place], [place] [address]       [phone]         [facility] [address] [place], [place] [phone]  [facility] [address] [place], [place] [phone]  [facility] - Intensive Outpatient      [address]     [place], [place]       [phone]         [facility] ([facility]     [address] [place], [place] [phone] or [phone]  Residential Treatment Services (RTS), Medicaid [address] [place],  [place] [phone]  [facility] [address]                                               [address] [place] [phone]  [facility] Resources: [phone]               General Therapy                                                Angie Fava, PhD        [address] [place], [place] [address]         [phone]   Insurance  [facility]   [address] [place], [place] [address] [phone]  [facility] [address] [place], [place] [address] [phone] Insurance/Medicaid/sponsorship through [facility]                                              [address]                                        [place], [place] [address]    Therapy/tele-psych/case         [phone]          [facility] [address], [place]  [address]  Adolescent/group home/case management [phone]                                           Creola Corn PhD       General therapy       Insurance   [phone]         Dr. Lolly Mustache, [place], M-F [phone]  [facility]  [employer] [facility] Dept. [address]  [address]         [address] [place] [address]  Blondell Reveal Phone:  [phone]                                  Phone:  [phone]                   Phone:  [phone]  [facility], [phone] - [facility] - [facility]- [phone]       -     [facility] in [place], [address]             [phone], Insurance  [place] Child Abuse Hotline 229-435-0342 or [phone] (After Hours)

## 2013-01-04 NOTE — ED Notes (Signed)
Pt being discharged, will wait in lobby for son, Ace Gins to pick her up.  Pt is in view of nurse first.
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
## 2013-01-05 ENCOUNTER — Emergency Department (HOSPITAL_COMMUNITY): Payer: Medicare Other

## 2013-01-05 ENCOUNTER — Encounter (HOSPITAL_COMMUNITY): Payer: Self-pay

## 2013-01-05 ENCOUNTER — Emergency Department (HOSPITAL_COMMUNITY)
Admission: EM | Admit: 2013-01-05 | Discharge: 2013-01-06 | Disposition: A | Discharge status: Home / Self Care | Payer: Medicare Other | Attending: Emergency Medicine | Admitting: Emergency Medicine

## 2013-01-05 DIAGNOSIS — F411 Generalized anxiety disorder: Secondary | ICD-10-CM | POA: Insufficient documentation

## 2013-01-05 DIAGNOSIS — J449 Chronic obstructive pulmonary disease, unspecified: Secondary | ICD-10-CM | POA: Insufficient documentation

## 2013-01-05 DIAGNOSIS — M109 Gout, unspecified: Secondary | ICD-10-CM | POA: Insufficient documentation

## 2013-01-05 DIAGNOSIS — J4489 Other specified chronic obstructive pulmonary disease: Secondary | ICD-10-CM | POA: Insufficient documentation

## 2013-01-05 DIAGNOSIS — Z8669 Personal history of other diseases of the nervous system and sense organs: Secondary | ICD-10-CM | POA: Insufficient documentation

## 2013-01-05 DIAGNOSIS — F17200 Nicotine dependence, unspecified, uncomplicated: Secondary | ICD-10-CM | POA: Insufficient documentation

## 2013-01-05 DIAGNOSIS — Z79899 Other long term (current) drug therapy: Secondary | ICD-10-CM | POA: Insufficient documentation

## 2013-01-05 DIAGNOSIS — F1910 Other psychoactive substance abuse, uncomplicated: Secondary | ICD-10-CM

## 2013-01-05 DIAGNOSIS — R4182 Altered mental status, unspecified: Secondary | ICD-10-CM

## 2013-01-05 DIAGNOSIS — R410 Disorientation, unspecified: Secondary | ICD-10-CM

## 2013-01-05 DIAGNOSIS — R45851 Suicidal ideations: Secondary | ICD-10-CM

## 2013-01-05 DIAGNOSIS — F29 Unspecified psychosis not due to a substance or known physiological condition: Secondary | ICD-10-CM | POA: Insufficient documentation

## 2013-01-05 DIAGNOSIS — I10 Essential (primary) hypertension: Secondary | ICD-10-CM | POA: Insufficient documentation

## 2013-01-05 DIAGNOSIS — Z8719 Personal history of other diseases of the digestive system: Secondary | ICD-10-CM | POA: Insufficient documentation

## 2013-01-05 DIAGNOSIS — IMO0002 Reserved for concepts with insufficient information to code with codable children: Secondary | ICD-10-CM | POA: Insufficient documentation

## 2013-01-05 DIAGNOSIS — Z8619 Personal history of other infectious and parasitic diseases: Secondary | ICD-10-CM | POA: Insufficient documentation

## 2013-01-05 DIAGNOSIS — R892 Abnormal level of other drugs, medicaments and biological substances in specimens from other organs, systems and tissues: Secondary | ICD-10-CM | POA: Insufficient documentation

## 2013-01-05 DIAGNOSIS — F319 Bipolar disorder, unspecified: Secondary | ICD-10-CM

## 2013-01-05 DIAGNOSIS — Z862 Personal history of diseases of the blood and blood-forming organs and certain disorders involving the immune mechanism: Secondary | ICD-10-CM | POA: Insufficient documentation

## 2013-01-05 DIAGNOSIS — Z8742 Personal history of other diseases of the female genital tract: Secondary | ICD-10-CM | POA: Insufficient documentation

## 2013-01-05 LAB — RAPID URINE DRUG SCREEN, HOSP PERFORMED
Amphetamines: NOT DETECTED
Benzodiazepines: NOT DETECTED
Cocaine: NOT DETECTED
Opiates: NOT DETECTED

## 2013-01-05 MED ORDER — NICOTINE 21 MG/24HR TD PT24
21.0000 mg | MEDICATED_PATCH | Freq: Every day | TRANSDERMAL | Status: DC
  Administered 2013-01-06 (×2): 21 mg via TRANSDERMAL
  Filled 2013-01-05 (×2): qty 1
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
## 2013-01-05 NOTE — ED Provider Notes (Signed)
History    This chart was scribed for non-physician practitioner, Arthor Captain, PA-C, working with Vida Roller, MD by Donne Anon, ED Scribe. This patient was seen in room [facility] and the patient's care was started at 2315.   CSN: [id number]  Arrival date & time 01/05/13  2247   First MD Initiated Contact with Patient 01/05/13 2315      Chief Complaint  Patient presents with  . Medical Clearance    The history is provided by the patient and a relative. No language interpreter was used.   HPI Comments: Dorothy Burns is a 57 y.o. female who presents to the Emergency Department complaining of SI. Per pts family, pt is confused and has been wandering away from her home. Her family states that she has had underlying confusion for a while but it is acutely worse over the past 4 days. She was brought to the ED by [employer] yesterday because the police found her wandering the street. Her family states she has been losing things and has recently been arrested for failure to appear on a hit a run accident but the pt cannot recall the accident. The pt states she has been feeling suicidal for years and "if something is not done, we might as well blow her brains out." She states she has been using cocaine for the past 4 years but stopped 7 days ago. She lives by herself. She denies nausea, vomiting, diarrhea or any other pain. She also reports the toes on both of her feet hurt.  Past Medical History  Diagnosis Date  . Hypertension   . Bipolar 1 disorder   . Menorrhagia   . Post-menopausal bleeding     MOST RECENT BLEEDING  WAS NOV 2013  . Paresthesia   . COPD (chronic obstructive pulmonary disease)   . 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 Gout   . Mental disorder     bipolar  . Asthma   . Shortness of breath   . Anemia   . GERD (gastroesophageal reflux disease)   . Gallstones     ABD PAIN, N&V  . Arthritis   . Headache(784.0)   . Hepatitis     Hep C x 10 yrs  - NO  TREATMENT    Past Surgical History  Procedure Laterality Date  . Tubal ligation    . Facial reconstruction surgery    . Multiple tooth extractions    . Hysteroscopy w/d&c  09/29/2011    Procedure: DILATATION AND CURETTAGE /HYSTEROSCOPY;  Surgeon: Catalina Antigua, MD;  Location: [facility];  Service: Gynecology;  Laterality: N/A;  . Cholecystectomy  08/20/2012    Procedure: LAPAROSCOPIC CHOLECYSTECTOMY;  Surgeon: Axel Filler, MD;  Location: [facility];  Service: General;;    Family History  Problem Relation Age of Onset  . COPD Mother   . Diabetes Mother   . Heart disease Mother   . Diabetes Father   . Heart disease Father   . Hypertension Father     History  Substance Use Topics  . Smoking status: Current Every Day Smoker -- 2.00 packs/day for 30 years    Types: Cigarettes  . Smokeless tobacco: Never Used     Comment: in process of quitting---would like nicotine patch if insurance covers it  . Alcohol Use: No     Comment: recovering alcoholic    NO COCAINE SINCE 1990    OB History   Grav Para Term Preterm Abortions TAB  SAB Ect Mult Living   8 4 4  0 4 1 3   4       Review of Systems  Musculoskeletal: Positive for arthralgias.  Psychiatric/Behavioral: Positive for suicidal ideas and behavioral problems.  All other systems reviewed and are negative.    Allergies  Review of patient's allergies indicates no known allergies.  Home Medications   Current Outpatient Rx  Name  Route  Sig  Dispense  Refill  . budesonide-formoterol (SYMBICORT) 160-4.5 MCG/ACT inhaler   Inhalation   Inhale 2 puffs into the lungs 2 (two) times daily.   1 Inhaler   3   . clonazePAM (KLONOPIN) 1 MG tablet   Oral   Take 1 mg by mouth 4 (four) times daily as needed for anxiety.          . furosemide (LASIX) 20 MG tablet   Oral   Take 20 mg by mouth daily.         . hydrochlorothiazide (HYDRODIURIL) 25 MG tablet   Oral   Take 25 mg by mouth at bedtime.          Marland Kitchen  HYDROcodone-acetaminophen (NORCO/VICODIN) 5-325 MG per tablet   Oral   Take 1 tablet by mouth every 6 (six) hours as needed for pain.         . indomethacin (INDOCIN) 25 MG capsule   Oral   Take 25 mg by mouth 3 (three) times daily as needed (for gout).         Marland Kitchen lithium 300 MG capsule   Oral   Take 300-600 mg by mouth 3 (three) times daily with meals. Pt takes 2 capsules in the morning, 1 midday, and 2 at bedtime         . pregabalin (LYRICA) 75 MG capsule   Oral   Take 75 mg by mouth 2 (two) times daily.         . traMADol (ULTRAM) 50 MG tablet   Oral   Take 50 mg by mouth every 8 (eight) hours as needed for pain.          Marland Kitchen zolpidem (AMBIEN) 10 MG tablet   Oral   Take 10 mg by mouth at bedtime.           BP 134/92  Pulse 71  Temp(Src) 98.4 F (36.9 C) (Oral)  Resp 20  SpO2 98%  LMP 05/21/2012  Physical Exam  Nursing note and vitals reviewed. Constitutional: She appears well-developed and well-nourished. No distress.  HENT:  Head: Normocephalic and atraumatic.  Eyes: Conjunctivae are normal.  Neck: Neck supple. No tracheal deviation present.  Cardiovascular: Normal rate, regular rhythm and normal heart sounds.   Pulmonary/Chest: Effort normal and breath sounds normal. No respiratory distress. She has no wheezes. She has no rales.  Abdominal: Soft. She exhibits no distension. There is no tenderness. There is no rebound and no guarding.  Musculoskeletal: Normal range of motion.  Neurological: She is alert.  Tremulous without clonus, some extrapyramidal symptoms.  Skin: Skin is warm and dry.  Psychiatric: Her mood appears anxious. Her affect is labile. Her speech is tangential. She is agitated and hyperactive. Thought content is not paranoid. She expresses impulsivity. She exhibits a depressed mood. She expresses suicidal ideation.  Patient is tearful and labile. She becomes easily agitated but is easily redirectable. She is at times confused and does not  remember being here yesterday. She knows who she is and where she is. She is aware of the year.  ED Course  Procedures (including critical care time) DIAGNOSTIC STUDIES: Oxygen Saturation is 98% on RA, normal by my interpretation.    COORDINATION OF CARE: 11:41 PM Discussed treatment plan which includes labs and bilateral foot xray with pt at bedside and pt agreed to plan. Discussed possible delirium diagnosis. Will give a nicotine patch.    Labs Reviewed  CBC - Abnormal; Notable for the following:    Hemoglobin 11.2 (*)    HCT 35.5 (*)    Platelets 115 (*)    All other components within normal limits  COMPREHENSIVE METABOLIC PANEL - Abnormal; Notable for the following:    Sodium 134 (*)    Glucose, Bld 102 (*)    Albumin 3.2 (*)    AST 46 (*)    Alkaline Phosphatase 159 (*)    GFR calc non Af Amer 57 (*)    GFR calc Af Amer 66 (*)    All other components within normal limits  SALICYLATE LEVEL - Abnormal; Notable for the following:    Salicylate Lvl <2.0 (*)    All other components within normal limits  LITHIUM LEVEL - Abnormal; Notable for the following:    Lithium Lvl 1.79 (*)    All other components within normal limits  ACETAMINOPHEN LEVEL  ETHANOL  URINE RAPID DRUG SCREEN (HOSP PERFORMED)   No results found.   No diagnosis found.    MDM  Patient with slightly elevated lithium level. Ambisinister fluids. Recheck the patient's lithium level at 5:00 AM and if below 1.5 and she may be cleared for psych evaluation. EKG is pending I have given report to Dr. Norlene Campbell who will assume care of the patient.  I personally performed the services described in this documentation, which was scribed in my presence. The recorded information has been reviewed and is accurate.         Arthor Captain, PA-C 01/06/13 0923  Arthor Captain, PA-C 01/14/13 [phone]

## 2013-01-06 ENCOUNTER — Encounter (HOSPITAL_COMMUNITY): Payer: Self-pay | Admitting: Radiology

## 2013-01-06 ENCOUNTER — Emergency Department (HOSPITAL_COMMUNITY): Payer: Medicare Other

## 2013-01-06 DIAGNOSIS — F329 Major depressive disorder, single episode, unspecified: Secondary | ICD-10-CM

## 2013-01-06 DIAGNOSIS — R45851 Suicidal ideations: Secondary | ICD-10-CM

## 2013-01-06 DIAGNOSIS — F1910 Other psychoactive substance abuse, uncomplicated: Secondary | ICD-10-CM | POA: Diagnosis present

## 2013-01-06 LAB — COMPREHENSIVE METABOLIC PANEL
AST: 46 U/L — ABNORMAL HIGH (ref 0–37)
Albumin: 3.2 g/dL — ABNORMAL LOW (ref 3.5–5.2)
Calcium: 10 mg/dL (ref 8.4–10.5)
Chloride: 104 mEq/L (ref 96–112)
Creatinine, Ser: 1.06 mg/dL (ref 0.50–1.10)
Total Protein: 7.4 g/dL (ref 6.0–8.3)

## 2013-01-06 LAB — CBC
MCV: 89.2 fL (ref 78.0–100.0)
Platelets: 115 10*3/uL — ABNORMAL LOW (ref 150–400)
RDW: 15 % (ref 11.5–15.5)
WBC: 5 10*3/uL (ref 4.0–10.5)

## 2013-01-06 LAB — SALICYLATE LEVEL: Salicylate Lvl: <2 mg/dL — ABNORMAL LOW (ref 2.8–20.0)

## 2013-01-06 MED ORDER — SODIUM CHLORIDE 0.9 % IV SOLN: Freq: Once | INTRAVENOUS | Status: DC

## 2013-01-06 MED ORDER — LORAZEPAM 1 MG PO TABS
1.0000 mg | ORAL_TABLET | Freq: Three times a day (TID) | ORAL | Status: DC | PRN
  Administered 2013-01-06: 1 mg via ORAL
  Filled 2013-01-06: qty 1

## 2013-01-06 MED ORDER — ACETAMINOPHEN 325 MG PO TABS
650.0000 mg | ORAL_TABLET | ORAL | Status: DC | PRN
  Filled 2013-01-06: qty 2

## 2013-01-06 MED ORDER — SODIUM CHLORIDE 0.9 % IV SOLN: 1000.0000 mL | INTRAVENOUS | Status: DC

## 2013-01-06 MED ORDER — BUDESONIDE-FORMOTEROL FUMARATE 160-4.5 MCG/ACT IN AERO
2.0000 | INHALATION_SPRAY | Freq: Two times a day (BID) | RESPIRATORY_TRACT | Status: DC
  Administered 2013-01-06: 2 via RESPIRATORY_TRACT
  Filled 2013-01-06: qty 6

## 2013-01-06 MED ORDER — ZOLPIDEM TARTRATE 5 MG PO TABS: 5.0000 mg | ORAL_TABLET | Freq: Every day | ORAL | Status: DC

## 2013-01-06 MED ORDER — SODIUM CHLORIDE 0.9 % IV SOLN: 1000.0000 mL | Freq: Once | INTRAVENOUS | Status: DC

## 2013-01-06 MED ORDER — LITHIUM CARBONATE 300 MG PO CAPS: 300.0000 mg | ORAL_CAPSULE | Freq: Three times a day (TID) | ORAL | Status: DC

## 2013-01-06 MED ORDER — ALUM & MAG HYDROXIDE-SIMETH 200-200-20 MG/5ML PO SUSP: 30.0000 mL | ORAL | Status: DC | PRN

## 2013-01-06 MED ORDER — ZOLPIDEM TARTRATE 10 MG PO TABS: 10.0000 mg | ORAL_TABLET | Freq: Every day | ORAL | Status: DC

## 2013-01-06 MED ORDER — SODIUM CHLORIDE 0.9 % IV SOLN
1000.0000 mL | Freq: Once | INTRAVENOUS | Status: AC
  Administered 2013-01-06: 1000 mL via INTRAVENOUS

## 2013-01-06 MED ORDER — HYDROCHLOROTHIAZIDE 25 MG PO TABS
25.0000 mg | ORAL_TABLET | Freq: Every day | ORAL | Status: DC
  Filled 2013-01-06: qty 1

## 2013-01-06 MED ORDER — ONDANSETRON HCL 4 MG PO TABS
4.0000 mg | ORAL_TABLET | Freq: Three times a day (TID) | ORAL | Status: DC | PRN
  Administered 2013-01-06: 4 mg via ORAL
  Filled 2013-01-06: qty 1

## 2013-01-06 MED ORDER — IBUPROFEN 600 MG PO TABS: 600.0000 mg | ORAL_TABLET | Freq: Three times a day (TID) | ORAL | Status: DC | PRN

## 2013-01-06 NOTE — ED Provider Notes (Signed)
Repeat lithium level 1.43.  While this is higher than recommended therapeutic concentration, not at toxic level.  Pt without clonus or rigidity.  She is agitated, continues to endorse suicidal ideations.  Feel patient is medically stable for transfer to psych unit and plan for their evaluation.  Olivia Mackie, MD 01/06/13 [phone]

## 2013-01-06 NOTE — ED Notes (Signed)
Spoke with poison control.  Reported 5am Lithium level of 1.43 and current set of VS, Pt up ambulating, eating breakfast, pleasant and cooperative, Poison control reports that they are closing her case and that no other testing needs to be done at this time.

## 2013-01-06 NOTE — ED Notes (Signed)
Spoke with poison control regarding lithium level and sx.

## 2013-01-06 NOTE — ED Provider Notes (Signed)
Patient has been seen by psychiatry and cleared for discharge. She has followup at day [facility]. Will decrease lithium scheduled doses. No recommendations on dosing given by psychiatry.  Juliet Rude. Rubin Payor, MD 01/06/13 1755

## 2013-01-06 NOTE — ED Notes (Signed)
Pt has been making inappropriate hypersexual comments to female staff members such as "Come in here.  I want to put peanut butter on your body."  The pt then made inappropriate sexual noises.  Pt has been trying to invite female staff to come back to her apartment.

## 2013-01-06 NOTE — ED Provider Notes (Signed)
Pt requesting to take a bath and do other personal hygiene.    Filed Vitals:   01/06/13 0640  BP: 137/87  Pulse: 70  Temp: 98 F (36.7 C)  Resp: 18    Pt is awake and alert.    Stable at this time medically. Awaiting psychiatric disposition.    Celene Kras, MD 01/06/13 [phone]

## 2013-01-06 NOTE — ED Notes (Signed)
Pt taken off unit by imaging staff for CT scan.

## 2013-01-06 NOTE — Consult Note (Signed)
Reason for Consult:  Evaluation for inpatient treatment Referring Physician: EDP  Dorothy Burns is an 57 y.o. female.  HPI:  Patient states that she is here because of her lithium level.  "MY lithium level was high and making me crazy.  I feel better now."  Patient states that she made the statement that she wanted to kill her self. "I get sad sometimes and I miss my mother; but I don't want to kill my self." Patient states that she has been drug free for 18 weeks and that a friend and neighbor has been really good at helping her stay clean.  Patient states that she has Bipolar, Gout, Hep C, COPD which are her only illness.  Patient has prior mental health treatment "A long time ago when I was shooting up and it helped." and the most recent from the [facility] stating "It didn't help at the time I just wasn't interested."  Patient states at this time she is interested in outpatient resources to help with rehab.    Past Medical History  Diagnosis Date  . Hypertension   . Bipolar 1 disorder   . Menorrhagia   . Post-menopausal bleeding     MOST RECENT BLEEDING  WAS NOV 2013  . Paresthesia   . COPD (chronic obstructive pulmonary disease)   . 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 Gout   . Mental disorder     bipolar  . Asthma   . Shortness of breath   . Anemia   . GERD (gastroesophageal reflux disease)   . Gallstones     ABD PAIN, N&V  . Arthritis   . Headache(784.0)   . Hepatitis     Hep C x 10 yrs  - NO TREATMENT    Past Surgical History  Procedure Laterality Date  . Tubal ligation    . Facial reconstruction surgery    . Multiple tooth extractions    . Hysteroscopy w/d&c  09/29/2011    Procedure: DILATATION AND CURETTAGE /HYSTEROSCOPY;  Surgeon: Catalina Antigua, MD;  Location: [facility];  Service: Gynecology;  Laterality: N/A;  . Cholecystectomy  08/20/2012    Procedure: LAPAROSCOPIC CHOLECYSTECTOMY;  Surgeon: Axel Filler, MD;  Location: [facility];  Service:  General;;    Family History  Problem Relation Age of Onset  . COPD Mother   . Diabetes Mother   . Heart disease Mother   . Diabetes Father   . Heart disease Father   . Hypertension Father     Social History:  reports that she has been smoking Cigarettes.  She has a 60 pack-year smoking history. She has never used smokeless tobacco. She reports that she does not drink alcohol or use illicit drugs.  Allergies: No Known Allergies  Medications: I have reviewed the patient's current medications.  Results for orders placed during the hospital encounter of 01/05/13 (from the past 48 hour(s))  ACETAMINOPHEN LEVEL     Status: None   Collection Time    01/05/13 11:27 PM      Result Value Range   Acetaminophen (Tylenol), Serum <15.0  10 - 30 ug/mL   Comment:            THERAPEUTIC CONCENTRATIONS VARY     SIGNIFICANTLY. A RANGE OF 10-30     ug/mL MAY BE AN EFFECTIVE     CONCENTRATION FOR MANY PATIENTS.     HOWEVER, SOME ARE BEST TREATED     AT CONCENTRATIONS OUTSIDE  THIS     RANGE.     ACETAMINOPHEN CONCENTRATIONS     >150 ug/mL AT 4 HOURS AFTER     INGESTION AND >50 ug/mL AT 12     HOURS AFTER INGESTION ARE     OFTEN ASSOCIATED WITH TOXIC     REACTIONS.  CBC     Status: Abnormal   Collection Time    01/05/13 11:27 PM      Result Value Range   WBC 5.0  4.0 - 10.5 K/uL   RBC 3.98  3.87 - 5.11 MIL/uL   Hemoglobin 11.2 (*) 12.0 - 15.0 g/dL   HCT 16.1 (*) 09.6 - 04.5 %   MCV 89.2  78.0 - 100.0 fL   MCH 28.1  26.0 - 34.0 pg   MCHC 31.5  30.0 - 36.0 g/dL   RDW 40.9  81.1 - 91.4 %   Platelets 115 (*) 150 - 400 K/uL   Comment: SPECIMEN CHECKED FOR CLOTS     PLATELET COUNT CONFIRMED BY SMEAR  COMPREHENSIVE METABOLIC PANEL     Status: Abnormal   Collection Time    01/05/13 11:27 PM      Result Value Range   Sodium 134 (*) 135 - 145 mEq/L   Potassium 3.7  3.5 - 5.1 mEq/L   Chloride 104  96 - 112 mEq/L   CO2 23  19 - 32 mEq/L   Glucose, Bld 102 (*) 70 - 99 mg/dL   BUN 12  6 -  23 mg/dL   Creatinine, Ser 7.82  0.50 - 1.10 mg/dL   Calcium 95.6  8.4 - 21.3 mg/dL   Total Protein 7.4  6.0 - 8.3 g/dL   Albumin 3.2 (*) 3.5 - 5.2 g/dL   AST 46 (*) 0 - 37 U/L   ALT 31  0 - 35 U/L   Alkaline Phosphatase 159 (*) 39 - 117 U/L   Total Bilirubin 0.9  0.3 - 1.2 mg/dL   GFR calc non Af Amer 57 (*) >90 mL/min   GFR calc Af Amer 66 (*) >90 mL/min   Comment:            The eGFR has been calculated     using the CKD EPI equation.     This calculation has not been     validated in all clinical     situations.     eGFR's persistently     <90 mL/min signify     possible Chronic Kidney Disease.  ETHANOL     Status: None   Collection Time    01/05/13 11:27 PM      Result Value Range   Alcohol, Ethyl (B) <11  0 - 11 mg/dL   Comment:            LOWEST DETECTABLE LIMIT FOR     SERUM ALCOHOL IS 11 mg/dL     FOR MEDICAL PURPOSES ONLY  SALICYLATE LEVEL     Status: Abnormal   Collection Time    01/05/13 11:27 PM      Result Value Range   Salicylate Lvl <2.0 (*) 2.8 - 20.0 mg/dL  URINE RAPID DRUG SCREEN (HOSP PERFORMED)     Status: None   Collection Time    01/05/13 11:42 PM      Result Value Range   Opiates NONE DETECTED  NONE DETECTED   Cocaine NONE DETECTED  NONE DETECTED   Benzodiazepines NONE DETECTED  NONE DETECTED   Amphetamines NONE DETECTED  NONE DETECTED  Tetrahydrocannabinol NONE DETECTED  NONE DETECTED   Barbiturates NONE DETECTED  NONE DETECTED   Comment:            DRUG SCREEN FOR MEDICAL PURPOSES     ONLY.  IF CONFIRMATION IS NEEDED     FOR ANY PURPOSE, NOTIFY LAB     WITHIN 5 DAYS.                LOWEST DETECTABLE LIMITS     FOR URINE DRUG SCREEN     Drug Class       Cutoff (ng/mL)     Amphetamine      1000     Barbiturate      200     Benzodiazepine   200     Tricyclics       300     Opiates          300     Cocaine          300     THC              50  LITHIUM LEVEL     Status: Abnormal   Collection Time    01/06/13 12:03 AM      Result  Value Range   Lithium Lvl 1.79 (*) 0.80 - 1.40 mEq/L   Comment: CRITICAL RESULT CALLED TO, READ BACK BY AND VERIFIED WITH:     HOBSON,F RN AT 1258 ON [id number] BY MCREYNOLDS,B  LITHIUM LEVEL     Status: Abnormal   Collection Time    01/06/13  5:06 AM      Result Value Range   Lithium Lvl 1.43 (*) 0.80 - 1.40 mEq/L    Dg Chest 2 View  01/06/2013   *RADIOLOGY REPORT*  Clinical Data:   Bilateral foot pain.  CHEST - 2 VIEW  Comparison: Chest x-ray of 08/16/2012.  Findings: Lung volumes are normal.  No consolidative airspace disease.  No pleural effusions.  No pneumothorax.  Pulmonary vasculature is normal.  Heart size appears borderline enlarged. The patient is rotated to the right on today's exam, resulting in distortion of the mediastinal contours and reduced diagnostic sensitivity and specificity for mediastinal pathology.  Surgical clips project over the right upper quadrant of the abdomen, likely from prior cholecystectomy.  IMPRESSION: 1.  No radiographic evidence of acute cardiopulmonary disease.   Original Report Authenticated By: Trudie Reed, M.D.   Ct Head Wo Contrast  01/06/2013   *RADIOLOGY REPORT*  Clinical Data: Medical clearance, suicidal ideation  CT HEAD WITHOUT CONTRAST  Technique:  Contiguous axial images were obtained from the base of the skull through the vertex without contrast.  Comparison: None.  Findings: No evidence of parenchymal hemorrhage or extra-axial fluid collection. No mass lesion, mass effect, or midline shift.  No CT evidence of acute infarction.  Mild frontal lobe cortical atrophy.  The visualized paranasal sinuses are essentially clear. The mastoid air cells are unopacified.  No evidence of calvarial fracture.  Prior surgical fixation of the right zygoma/maxilla.  IMPRESSION: No evidence of acute intracranial abnormality.   Original Report Authenticated By: Charline Bills, M.D.   Dg Foot Complete Left  01/06/2013   *RADIOLOGY REPORT*  Clinical Data: Foot pain  after climbing a fence.  LEFT FOOT - COMPLETE 3+ VIEW  Comparison: Right foot radiographs same day there are left foot radiographs of 07/27/2009  Findings: The joints are aligned.  There are degenerative changes of the first metatarsophalangeal joint. No acute or healing  fracture is identified.  No discrete soft tissue swelling appreciated.  Small plantar calcaneal spur noted.  IMPRESSION: No acute bony abnormality.   Original Report Authenticated By: Britta Mccreedy, M.D.   Dg Foot Complete Right  01/06/2013   *RADIOLOGY REPORT*  Clinical Data: Medical clearance.  Pain after climbing fence  RIGHT FOOT COMPLETE - 3+ VIEW  Comparison: Left foot radiographs  Findings: Normal bony mineralization and alignment.  No acute or healing fracture or focal soft tissue swelling is identified.  IMPRESSION: No acute bony abnormality.   Original Report Authenticated By: Britta Mccreedy, M.D.    Review of Systems  Gastrointestinal: Positive for nausea.  Genitourinary: Negative for dysuria.  Psychiatric/Behavioral: Positive for depression, suicidal ideas (passive) and substance abuse. Negative for hallucinations. The patient is not nervous/anxious.    Blood pressure 139/84, pulse 67, temperature 97.5 F (36.4 C), temperature source Oral, resp. rate 20, last menstrual period 05/21/2012, SpO2 100.00%. Physical Exam  Constitutional: She is oriented to person, place, and time.  HENT:  Head: Normocephalic.  Eyes: Pupils are equal, round, and reactive to light.  Neck: Normal range of motion.  Respiratory: Effort normal.  GI: Soft. Bowel sounds are normal.  Musculoskeletal: Normal range of motion.  Neurological: She is alert and oriented to person, place, and time.  Skin: Skin is warm and dry.  Psychiatric: She is not actively hallucinating. Thought content is not paranoid and not delusional. Cognition and memory are impaired. She exhibits a depressed mood. She expresses no homicidal and no suicidal ideation. She  expresses no suicidal plans.  Patient states that she may think about suicide from time to time.  Patient states that yesterday she was just sad.  Patient states that she has no plan.    Patient alert/oriented yet speech is slightly slurred but understandable.  No s/s or withdrawal noted. Patient able to communicate what she is feeling and needs.     Assessment/Plan:  Face to face interview and consulted with Dr. Ladona Ridgel Recommendation:  Out patient treatment Patient accepted at [facility] intake assessment scheduled for Wednesday 01/13/2012 at 0800 AM. Appointment and contact information given to patient.  Rankin, Shuvon, FNP-BC 01/06/2013, 4:00 PM

## 2013-01-06 NOTE — ED Notes (Signed)
Pt changed into blue scrubs.   Pt belongings were sent with family.   Pt has been seen and searched by security.

## 2013-01-06 NOTE — ED Notes (Signed)
Per Poison Control hydrate patient with NS 150-225ml/hr until 4am unless pt displays clonus or rigidity. If level does not come down or pt becomes worse pt needs to have dialysis, pt can be toxic at any level

## 2013-01-09 ENCOUNTER — Emergency Department (HOSPITAL_COMMUNITY)
Admission: EM | Admit: 2013-01-09 | Discharge: 2013-01-09 | Disposition: A | Discharge status: Home / Self Care | Payer: Medicare Other | Attending: Emergency Medicine | Admitting: Emergency Medicine

## 2013-01-09 ENCOUNTER — Encounter (HOSPITAL_COMMUNITY): Payer: Self-pay | Admitting: Emergency Medicine

## 2013-01-09 DIAGNOSIS — L03119 Cellulitis of unspecified part of limb: Secondary | ICD-10-CM

## 2013-01-09 DIAGNOSIS — F17200 Nicotine dependence, unspecified, uncomplicated: Secondary | ICD-10-CM | POA: Insufficient documentation

## 2013-01-09 DIAGNOSIS — K219 Gastro-esophageal reflux disease without esophagitis: Secondary | ICD-10-CM | POA: Insufficient documentation

## 2013-01-09 DIAGNOSIS — L02619 Cutaneous abscess of unspecified foot: Secondary | ICD-10-CM | POA: Insufficient documentation

## 2013-01-09 DIAGNOSIS — Z79899 Other long term (current) drug therapy: Secondary | ICD-10-CM | POA: Insufficient documentation

## 2013-01-09 DIAGNOSIS — J45909 Unspecified asthma, uncomplicated: Secondary | ICD-10-CM | POA: Insufficient documentation

## 2013-01-09 DIAGNOSIS — Z8719 Personal history of other diseases of the digestive system: Secondary | ICD-10-CM | POA: Insufficient documentation

## 2013-01-09 DIAGNOSIS — F489 Nonpsychotic mental disorder, unspecified: Secondary | ICD-10-CM | POA: Insufficient documentation

## 2013-01-09 DIAGNOSIS — F319 Bipolar disorder, unspecified: Secondary | ICD-10-CM | POA: Insufficient documentation

## 2013-01-09 DIAGNOSIS — M25571 Pain in right ankle and joints of right foot: Secondary | ICD-10-CM

## 2013-01-09 DIAGNOSIS — Z8639 Personal history of other endocrine, nutritional and metabolic disease: Secondary | ICD-10-CM | POA: Insufficient documentation

## 2013-01-09 DIAGNOSIS — I10 Essential (primary) hypertension: Secondary | ICD-10-CM | POA: Insufficient documentation

## 2013-01-09 DIAGNOSIS — D649 Anemia, unspecified: Secondary | ICD-10-CM | POA: Insufficient documentation

## 2013-01-09 DIAGNOSIS — F411 Generalized anxiety disorder: Secondary | ICD-10-CM | POA: Insufficient documentation

## 2013-01-09 DIAGNOSIS — Z8679 Personal history of other diseases of the circulatory system: Secondary | ICD-10-CM | POA: Insufficient documentation

## 2013-01-09 DIAGNOSIS — M129 Arthropathy, unspecified: Secondary | ICD-10-CM | POA: Insufficient documentation

## 2013-01-09 DIAGNOSIS — Z8742 Personal history of other diseases of the female genital tract: Secondary | ICD-10-CM | POA: Insufficient documentation

## 2013-01-09 DIAGNOSIS — Z862 Personal history of diseases of the blood and blood-forming organs and certain disorders involving the immune mechanism: Secondary | ICD-10-CM | POA: Insufficient documentation

## 2013-01-09 MED ORDER — CLINDAMYCIN HCL 150 MG PO CAPS: 300.0000 mg | ORAL_CAPSULE | Freq: Three times a day (TID) | ORAL | Status: DC

## 2013-01-09 NOTE — ED Provider Notes (Signed)
Medical screening examination/treatment/procedure(s) were performed by non-physician practitioner and as supervising physician I was immediately available for consultation/collaboration.   Charles B. Bernette Mayers, MD 01/09/13 1810

## 2013-01-09 NOTE — ED Provider Notes (Signed)
History     CSN: [id number]  Arrival date & time 01/09/13  1247   First MD Initiated Contact with Patient 01/09/13 1306      Chief Complaint  Patient presents with  . Ankle Pain    Right ankle    (Consider location/radiation/quality/duration/timing/severity/associated sxs/prior treatment) HPI Pt is a 57yo female presenting with right ankle pain for 2-3 days after being admitted to behavioral health earlier this week.  Pt states she initially noticed a "bug bite" on her right ankle and the next day she noticed increased pain, redness, and swelling.  Pain is "hot" and aching in nature, 8/10 on pain scale. Pain is constant, worse with walking.  Pt has not tried anything for pain.  Denies fever, n/v/d.  Does report hx of gout and does have a "gout pill" indomethacin.  Denies trauma to ankle.  Past Medical History  Diagnosis Date  . Hypertension   . Bipolar 1 disorder   . Menorrhagia   . Post-menopausal bleeding     MOST RECENT BLEEDING  WAS NOV 2013  . Paresthesia   . COPD (chronic obstructive pulmonary disease)   . 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 Gout   . Mental disorder     bipolar  . Asthma   . Shortness of breath   . Anemia   . GERD (gastroesophageal reflux disease)   . Gallstones     ABD PAIN, N&V  . Arthritis   . Headache(784.0)   . Hepatitis     Hep C x 10 yrs  - NO TREATMENT    Past Surgical History  Procedure Laterality Date  . Tubal ligation    . Facial reconstruction surgery    . Multiple tooth extractions    . Hysteroscopy w/d&c  09/29/2011    Procedure: DILATATION AND CURETTAGE /HYSTEROSCOPY;  Surgeon: Catalina Antigua, MD;  Location: [facility];  Service: Gynecology;  Laterality: N/A;  . Cholecystectomy  08/20/2012    Procedure: LAPAROSCOPIC CHOLECYSTECTOMY;  Surgeon: Axel Filler, MD;  Location: [facility];  Service: General;;    Family History  Problem Relation Age of Onset  . COPD Mother   . Diabetes Mother   . Heart disease Mother     . Diabetes Father   . Heart disease Father   . Hypertension Father     History  Substance Use Topics  . Smoking status: Current Every Day Smoker -- 2.00 packs/day for 30 years    Types: Cigarettes  . Smokeless tobacco: Never Used     Comment: in process of quitting---would like nicotine patch if insurance covers it  . Alcohol Use: No     Comment: recovering alcoholic    NO COCAINE SINCE 1990    OB History   Grav Para Term Preterm Abortions TAB SAB Ect Mult Living   8 4 4  0 4 1 3   4       Review of Systems  Constitutional: Negative for fever and chills.  Skin: Positive for color change ( increased redness of right ankle) and wound ( bug bite of right ankle).  Neurological: Negative for weakness and numbness.  All other systems reviewed and are negative.    Allergies  Review of patient's allergies indicates no known allergies.  Home Medications   Current Outpatient Rx  Name  Route  Sig  Dispense  Refill  . budesonide-formoterol (SYMBICORT) 160-4.5 MCG/ACT inhaler   Inhalation   Inhale 2 puffs into the lungs 2 (  two) times daily.   1 Inhaler   3   . clonazePAM (KLONOPIN) 1 MG tablet   Oral   Take 1 mg by mouth 4 (four) times daily as needed for anxiety.          . furosemide (LASIX) 20 MG tablet   Oral   Take 20 mg by mouth daily.         . hydrochlorothiazide (HYDRODIURIL) 25 MG tablet   Oral   Take 25 mg by mouth at bedtime.          Marland Kitchen HYDROcodone-acetaminophen (NORCO/VICODIN) 5-325 MG per tablet   Oral   Take 1 tablet by mouth every 6 (six) hours as needed for pain.         . indomethacin (INDOCIN) 25 MG capsule   Oral   Take 25 mg by mouth 3 (three) times daily as needed (for gout).         Marland Kitchen lithium carbonate 300 MG capsule   Oral   Take 1-2 capsules (300-600 mg total) by mouth 3 (three) times daily with meals. Pt takes 2 capsules in the morning, 1 midday, and 1 at bedtime   10 capsule   0   . omeprazole (PRILOSEC) 20 MG capsule    Oral   Take 20 mg by mouth daily.         . pregabalin (LYRICA) 75 MG capsule   Oral   Take 75 mg by mouth 2 (two) times daily.         . traMADol (ULTRAM) 50 MG tablet   Oral   Take 50 mg by mouth every 8 (eight) hours as needed for pain.          Marland Kitchen zolpidem (AMBIEN) 10 MG tablet   Oral   Take 10 mg by mouth at bedtime.         . clindamycin (CLEOCIN) 150 MG capsule   Oral   Take 2 capsules (300 mg total) by mouth 3 (three) times daily. May dispense as 150mg  capsules   60 capsule   0     BP 133/80  Pulse 72  Temp(Src) 98.3 F (36.8 C) (Oral)  Resp 22  SpO2 96%  LMP 05/21/2012  Physical Exam  Nursing note and vitals reviewed. Constitutional: She appears well-developed and well-nourished. No distress.  Disheveled pt sitting on exam bed, NAD.     HENT:  Head: Normocephalic and atraumatic.  Eyes: Conjunctivae are normal. No scleral icterus.  Neck: Normal range of motion.  Cardiovascular: Normal rate, regular rhythm and normal heart sounds.   Pulmonary/Chest: Effort normal and breath sounds normal. No respiratory distress. She has no wheezes. She has no rales. She exhibits no tenderness.  Musculoskeletal: Normal range of motion. She exhibits edema ( mild, right ankle) and tenderness ( mild, right ankle).       Feet:  Neurological: She is alert.  Skin: Skin is warm and dry. She is not diaphoretic.  Psychiatric: Her speech is normal and behavior is normal. Judgment and thought content normal. Her mood appears anxious. Cognition and memory are normal.    ED Course  Procedures (including critical care time)  Labs Reviewed - No data to display No results found.   1. Cellulitis of ankle   2. Right ankle pain       MDM  Exam of right ankle more consistent with mild cellulitis of the right ankle than gout.  Area is not sensitive to light touch however there is mild  edema, erythema, and warmth.  There is also a small scab on right lateral malleoli, likely  source of infection.  Infection appears superficial at this time.  Do not believe imaging or further workup is needed at this time.   Rx: clindamycin.  Advised pt to f/u with PCP in 3-4 days if symptoms not improving at all.  F/u with PCP, [place] [facility] info provided. Return precautions given. Pt verbalized understanding and agreement with tx plan. Vitals: unremarkable. Discharged in stable condition.    Discussed pt with Dr. Bernette Mayers during ED encounter.       Junius Finner, PA-C 01/09/13 1526  Junius Finner, PA-C 01/09/13 1526

## 2013-01-09 NOTE — ED Notes (Signed)
Pt reports possible bug bite to her right ankle. Pt presents with redness to outer right ankle area.

## 2013-02-26 NOTE — ED Provider Notes (Signed)
Medical screening examination/treatment/procedure(s) were performed by non-physician practitioner and as supervising physician I was immediately available for consultation/collaboration.    Vida Roller, MD 02/26/13 [phone]

## 2013-05-04 ENCOUNTER — Other Ambulatory Visit: Payer: Self-pay | Admitting: Family Medicine

## 2013-08-18 ENCOUNTER — Emergency Department (HOSPITAL_COMMUNITY)
Admission: EM | Admit: 2013-08-18 | Discharge: 2013-08-18 | Disposition: A | Discharge status: Home / Self Care | Payer: Medicare Other | Attending: Emergency Medicine | Admitting: Emergency Medicine

## 2013-08-18 ENCOUNTER — Encounter (HOSPITAL_COMMUNITY): Payer: Self-pay | Admitting: Emergency Medicine

## 2013-08-18 DIAGNOSIS — J3489 Other specified disorders of nose and nasal sinuses: Secondary | ICD-10-CM | POA: Insufficient documentation

## 2013-08-18 DIAGNOSIS — Z8619 Personal history of other infectious and parasitic diseases: Secondary | ICD-10-CM | POA: Insufficient documentation

## 2013-08-18 DIAGNOSIS — Z792 Long term (current) use of antibiotics: Secondary | ICD-10-CM | POA: Insufficient documentation

## 2013-08-18 DIAGNOSIS — Z8742 Personal history of other diseases of the female genital tract: Secondary | ICD-10-CM | POA: Insufficient documentation

## 2013-08-18 DIAGNOSIS — Z862 Personal history of diseases of the blood and blood-forming organs and certain disorders involving the immune mechanism: Secondary | ICD-10-CM | POA: Insufficient documentation

## 2013-08-18 DIAGNOSIS — IMO0002 Reserved for concepts with insufficient information to code with codable children: Secondary | ICD-10-CM | POA: Insufficient documentation

## 2013-08-18 DIAGNOSIS — K219 Gastro-esophageal reflux disease without esophagitis: Secondary | ICD-10-CM | POA: Insufficient documentation

## 2013-08-18 DIAGNOSIS — M109 Gout, unspecified: Secondary | ICD-10-CM | POA: Insufficient documentation

## 2013-08-18 DIAGNOSIS — F17200 Nicotine dependence, unspecified, uncomplicated: Secondary | ICD-10-CM | POA: Insufficient documentation

## 2013-08-18 DIAGNOSIS — F319 Bipolar disorder, unspecified: Secondary | ICD-10-CM | POA: Insufficient documentation

## 2013-08-18 DIAGNOSIS — I10 Essential (primary) hypertension: Secondary | ICD-10-CM | POA: Insufficient documentation

## 2013-08-18 DIAGNOSIS — J449 Chronic obstructive pulmonary disease, unspecified: Secondary | ICD-10-CM | POA: Insufficient documentation

## 2013-08-18 DIAGNOSIS — R202 Paresthesia of skin: Secondary | ICD-10-CM

## 2013-08-18 DIAGNOSIS — J4489 Other specified chronic obstructive pulmonary disease: Secondary | ICD-10-CM | POA: Insufficient documentation

## 2013-08-18 DIAGNOSIS — Z79899 Other long term (current) drug therapy: Secondary | ICD-10-CM | POA: Insufficient documentation

## 2013-08-18 DIAGNOSIS — R209 Unspecified disturbances of skin sensation: Secondary | ICD-10-CM | POA: Insufficient documentation

## 2013-08-18 MED ORDER — HYDROCODONE-ACETAMINOPHEN 5-325 MG PO TABS: 1.0000 | ORAL_TABLET | ORAL | Status: DC | PRN

## 2013-08-18 NOTE — ED Notes (Signed)
Pt is getting undressed for further assessment.

## 2013-08-18 NOTE — Discharge Instructions (Signed)
1. Medications: vicodin as needed for paion, usual home medications 2. Treatment: rest, drink plenty of fluids,  3. Follow Up: Please followup with your primary doctor for discussion of your diagnoses and further evaluation after today's visit;     Paresthesia Paresthesia is an abnormal burning or prickling sensation. This sensation is generally felt in the hands, arms, legs, or feet. However, it may occur in any part of the body. It is usually not painful. The feeling may be described as:  Tingling or numbness.  "Pins and needles."  Skin crawling.  Buzzing.  Limbs "falling asleep."  Itching. Most people experience temporary (transient) paresthesia at some time in their lives. CAUSES  Paresthesia may occur when you breathe too quickly (hyperventilation). It can also occur without any apparent cause. Commonly, paresthesia occurs when pressure is placed on a nerve. The feeling quickly goes away once the pressure is removed. For some people, however, paresthesia is a long-lasting (chronic) condition caused by an underlying disorder. The underlying disorder may be:  A traumatic, direct injury to nerves. Examples include a:  Broken (fractured) neck.  Fractured skull.  A disorder affecting the brain and spinal cord (central nervous system). Examples include:  Transverse myelitis.  Encephalitis.  Transient ischemic attack.  Multiple sclerosis.  Stroke.  Tumor or blood vessel problems, such as an arteriovenous malformation pressing against the brain or spinal cord.  A condition that damages the peripheral nerves (peripheral neuropathy). Peripheral nerves are not part of the brain and spinal cord. These conditions include:  Diabetes.  Peripheral vascular disease.  Nerve entrapment syndromes, such as carpal tunnel syndrome.  Shingles.  Hypothyroidism.  Vitamin B12 deficiencies.  Alcoholism.  Heavy metal poisoning (lead, arsenic).  Rheumatoid arthritis.  Systemic  lupus erythematosus. DIAGNOSIS  Your caregiver will attempt to find the underlying cause of your paresthesia. Your caregiver may:  Take your medical history.  Perform a physical exam.  Order various lab tests.  Order imaging tests. TREATMENT  Treatment for paresthesia depends on the underlying cause. HOME CARE INSTRUCTIONS  Avoid drinking alcohol.  You may consider massage or acupuncture to help relieve your symptoms.  Keep all follow-up appointments as directed by your caregiver. SEEK IMMEDIATE MEDICAL CARE IF:   You feel weak.  You have trouble walking or moving.  You have problems with speech or vision.  You feel confused.  You cannot control your bladder or bowel movements.  You feel numbness after an injury.  You faint.  Your burning or prickling feeling gets worse when walking.  You have pain, cramps, or dizziness.  You develop a rash. MAKE SURE YOU:  Understand these instructions.  Will watch your condition.  Will get help right away if you are not doing well or get worse. Document Released: 06/27/2002 Document Revised: 09/29/2011 Document Reviewed: 03/28/2011 [facility] Patient Information 2014 [place].

## 2013-08-18 NOTE — ED Notes (Signed)
Pt reports leg pain intermittent for a month; feels sharp and starts with lower right calf and goes to upper thigh. Pt has bruising on upper thigh. Reports it wakes her up in middle of night.

## 2013-08-18 NOTE — ED Notes (Signed)
Pt. reports intermittent right leg pain " stings" for 1 month denies injury , ambulatory , pt. also  reported intermittent  " stinging / tingling" of left arm .

## 2013-08-18 NOTE — ED Notes (Signed)
PT ambulated with baseline gait; VSS; A&Ox3; no signs of distress; respirations even and unlabored; skin warm and dry; no questions upon discharge.  

## 2013-08-18 NOTE — ED Provider Notes (Signed)
CSN: [id number]     Arrival date & time 08/18/13  1929 History  This chart was scribed for non-physician practitioner Abigail Butts, PA-C working with Virgel Manifold, MD by Zettie Pho, ED Scribe. This patient was seen in room TR05C/TR05C and the patient's care was started at 8:53 PM.    Chief Complaint  Patient presents with  . Leg Pain   The history is provided by the patient and medical records. No language interpreter was used.   HPI Comments: Dorothy Burns is a 58 y.o. female with a history of arthritis, gout, and paresthesias who presents to the Emergency Department complaining of an intermittent, shooting/stabbing pain to the right leg and left arm that she states has been ongoing for the past month, which will occasionally wakes her from sleep, and is exacerbated with walking/bearing weight only when it is occuring, but is not caused by walking or bearing weight. Patient states that she is not ambulatory during these episodes because her her pain is too great.  She reports an associated, intermittent, stinging pain with paresthesias to the left arm that presents at the same time as the leg pain. Patient states that she has had 3 episodes today, each episode lasting about 10 minutes, but denies experiencing these symptoms currently. She states she has experienced similar symptoms before over the past few months, which resolved on its own with rest, but her current episode is more severe than in the past. She denies any potential injury or trauma to the areas. She denies back pain, neck pain. She denies history of DM. Patient also has a history of HTN, COPD, asthma, and hepatitis C. Patient also has a history of mental disorder with bipolar I disorder and substance abuse (cocaine, 25 years ago). She denies anticoagulant medication use.   Past Medical History  Diagnosis Date  . Hypertension   . Bipolar 1 disorder   . Menorrhagia   . Post-menopausal bleeding     MOST RECENT BLEEDING  WAS  NOV 2013  . Paresthesia   . COPD (chronic obstructive pulmonary disease)   . 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 Gout   . Mental disorder     bipolar  . Asthma   . Shortness of breath   . Anemia   . GERD (gastroesophageal reflux disease)   . Gallstones     ABD PAIN, N&V  . Arthritis   . Headache(784.0)   . Hepatitis     Hep C x 10 yrs  - NO TREATMENT   Past Surgical History  Procedure Laterality Date  . Tubal ligation    . Facial reconstruction surgery    . Multiple tooth extractions    . Hysteroscopy w/d&c  09/29/2011    Procedure: DILATATION AND CURETTAGE /HYSTEROSCOPY;  Surgeon: Mora Bellman, MD;  Location: [facility];  Service: Gynecology;  Laterality: N/A;  . Cholecystectomy  08/20/2012    Procedure: LAPAROSCOPIC CHOLECYSTECTOMY;  Surgeon: Ralene Ok, MD;  Location: [facility];  Service: General;;   Family History  Problem Relation Age of Onset  . COPD Mother   . Diabetes Mother   . Heart disease Mother   . Diabetes Father   . Heart disease Father   . Hypertension Father    History  Substance Use Topics  . Smoking status: Current Every Day Smoker -- 2.00 packs/day for 30 years    Types: Cigarettes  . Smokeless tobacco: Never Used     Comment: in process  of quitting---would like nicotine patch if insurance covers it  . Alcohol Use: No     Comment: recovering alcoholic    NO COCAINE SINCE 1990   OB History   Grav Para Term Preterm Abortions TAB SAB Ect Mult Living   8 4 4  0 4 1 3   4      Review of Systems  Constitutional: Negative for fever, diaphoresis, appetite change, fatigue and unexpected weight change.  HENT: Negative for ear pain and mouth sores.        Nasal sores  Eyes: Negative for visual disturbance.  Respiratory: Positive for cough. Negative for chest tightness, shortness of breath and wheezing.   Cardiovascular: Negative for chest pain.  Gastrointestinal: Negative for nausea, vomiting, abdominal pain, diarrhea and constipation.   Endocrine: Negative for polydipsia, polyphagia and polyuria.  Genitourinary: Negative for dysuria, urgency, frequency and hematuria.  Musculoskeletal: Positive for arthralgias and myalgias. Negative for back pain, gait problem, neck pain and neck stiffness.       Left leg, left arm  Skin: Negative for color change and rash.  Allergic/Immunologic: Negative for immunocompromised state.  Neurological: Negative for syncope, light-headedness and headaches.       Paresthesias  Hematological: Does not bruise/bleed easily.  Psychiatric/Behavioral: Negative for sleep disturbance. The patient is not nervous/anxious.   All other systems reviewed and are negative.    Allergies  Review of patient's allergies indicates no known allergies.  Home Medications   Current Outpatient Rx  Name  Route  Sig  Dispense  Refill  . budesonide-formoterol (SYMBICORT) 160-4.5 MCG/ACT inhaler   Inhalation   Inhale 2 puffs into the lungs 2 (two) times daily.   1 Inhaler   3   . clindamycin (CLEOCIN) 150 MG capsule   Oral   Take 2 capsules (300 mg total) by mouth 3 (three) times daily. May dispense as 150mg  capsules   60 capsule   0   . clonazePAM (KLONOPIN) 1 MG tablet   Oral   Take 1 mg by mouth 4 (four) times daily as needed for anxiety.          . furosemide (LASIX) 20 MG tablet   Oral   Take 20 mg by mouth daily.         . hydrochlorothiazide (HYDRODIURIL) 25 MG tablet   Oral   Take 25 mg by mouth at bedtime.          Marland Kitchen HYDROcodone-acetaminophen (NORCO/VICODIN) 5-325 MG per tablet   Oral   Take 1 tablet by mouth every 6 (six) hours as needed for pain.         . indomethacin (INDOCIN) 25 MG capsule   Oral   Take 25 mg by mouth 3 (three) times daily as needed (for gout).         Marland Kitchen lithium carbonate 300 MG capsule   Oral   Take 1-2 capsules (300-600 mg total) by mouth 3 (three) times daily with meals. Pt takes 2 capsules in the morning, 1 midday, and 1 at bedtime   10 capsule    0   . omeprazole (PRILOSEC) 20 MG capsule   Oral   Take 20 mg by mouth daily.         . pregabalin (LYRICA) 75 MG capsule   Oral   Take 75 mg by mouth 2 (two) times daily.         . traMADol (ULTRAM) 50 MG tablet   Oral   Take 50 mg by mouth every  8 (eight) hours as needed for pain.          Marland Kitchen zolpidem (AMBIEN) 10 MG tablet   Oral   Take 10 mg by mouth at bedtime.          Triage Vitals: BP 133/87  Pulse 85  Temp(Src) 98.2 F (36.8 C) (Oral)  Resp 18  Wt 167 lb (75.751 kg)  SpO2 97%  LMP 05/21/2012  Physical Exam  Nursing note and vitals reviewed. Constitutional: She is oriented to person, place, and time. She appears well-developed and well-nourished. No distress.  Patient is awake and alert, but chronically ill-appearing.   HENT:  Head: Normocephalic and atraumatic.  Mouth/Throat: Oropharynx is clear and moist. No oropharyngeal exudate.  Eyes: Conjunctivae and EOM are normal. Pupils are equal, round, and reactive to light. No scleral icterus.  Neck: Normal range of motion. Neck supple.  Cardiovascular: Normal rate, regular rhythm, normal heart sounds and intact distal pulses.   No murmur heard. Pulmonary/Chest: Effort normal and breath sounds normal. No respiratory distress. She has no wheezes.  Patient coughing Clear and equal breath sounds  Abdominal: Soft. Bowel sounds are normal. She exhibits no mass. There is no tenderness. There is no rebound and no guarding.  Musculoskeletal: Normal range of motion. She exhibits no edema.  Capillary refill < 3 seconds.   Lymphadenopathy:    She has no cervical adenopathy.  Neurological: She is alert and oriented to person, place, and time. No cranial nerve deficit.  Speech is clear and goal oriented, follows commands Major Cranial nerves without deficit, no facial droop Normal strength in upper and lower extremities bilaterally including dorsiflexion and plantar flexion, strong and equal grip strength Sensation  decreased to sharp touch only in the bilateral toes but normal to light and sharp touch in the dorsum and plantar surfaces of the foot as well as up the entire legs and bilateral upper extremities Moves extremities without ataxia, coordination intact Normal finger to nose and rapid alternating movements Neg romberg, no pronator drift Normal gait and balance   Skin: Skin is warm and dry. No rash noted. She is not diaphoretic. No erythema.  No lesions noted to the lower legs  Psychiatric: She has a normal mood and affect.    ED Course  Procedures (including critical care time)  DIAGNOSTIC STUDIES: Oxygen Saturation is 97% on room air, normal by my interpretation.    COORDINATION OF CARE: 9:04 PM- Discussed concern that symptoms may be due to problems with her spine. Will consult with Dr. Wilson Singer. Discussed treatment plan with patient at bedside and patient verbalized agreement.     Labs Review Labs Reviewed - No data to display Imaging Review No results found.  EKG Interpretation   None       MDM   1. Paresthesias     Esmeralda Links presents with intermittent paresthesias in the right leg and left arm which occurred 3 times today and resolved completely and spontaneously within 10 minutes and was not associated with slurred speech, gait disturbance or focal weakness.  Patient with fairly decreased sensation in all toes bilaterally but no other focal neurologic deficit on exam. She is asymptomatic at this time and has been for several hours. She is without back pain or symptoms of cauda equina.  She denies headache, fever, stiff neck.  No concern for meningitis, CVA, unlikely TIA.  Potential new development of MS, but less likely due to age.  She ambualtes without difficulty.  Discussed with Dr.  Kohut who agrees with plan to discharge home with PCP followup and likely neurology referral.  It has been determined that no acute conditions requiring further emergency  intervention are present at this time. The patient/guardian have been advised of the diagnosis and plan. We have discussed signs and symptoms that warrant return to the ED, such as changes or worsening in symptoms.   Vital signs are stable at discharge.   BP 133/87  Pulse 85  Temp(Src) 98.2 F (36.8 C) (Oral)  Resp 18  Wt 167 lb (75.751 kg)  SpO2 97%  LMP 05/21/2012  Patient/guardian has voiced understanding and agreed to follow-up with the PCP or specialist.    I personally performed the services described in this documentation, which was scribed in my presence. The recorded information has been reviewed and is accurate.   Jarrett Soho Olene Godfrey, PA-C 08/18/13 2135

## 2013-08-19 NOTE — ED Provider Notes (Signed)
Medical screening examination/treatment/procedure(s) were performed by non-physician practitioner and as supervising physician I was immediately available for consultation/collaboration.  EKG Interpretation   None        Jonluke Cobbins, MD 08/19/13 0050 

## 2013-11-27 ENCOUNTER — Encounter (HOSPITAL_COMMUNITY): Payer: Self-pay | Admitting: Emergency Medicine

## 2013-11-27 ENCOUNTER — Emergency Department (HOSPITAL_COMMUNITY)
Admission: EM | Admit: 2013-11-27 | Discharge: 2013-11-27 | Disposition: A | Discharge status: Home / Self Care | Payer: Medicare Other | Attending: Emergency Medicine | Admitting: Emergency Medicine

## 2013-11-27 ENCOUNTER — Emergency Department (HOSPITAL_COMMUNITY): Payer: Medicare Other

## 2013-11-27 DIAGNOSIS — D61818 Other pancytopenia: Secondary | ICD-10-CM | POA: Insufficient documentation

## 2013-11-27 DIAGNOSIS — Z791 Long term (current) use of non-steroidal anti-inflammatories (NSAID): Secondary | ICD-10-CM | POA: Insufficient documentation

## 2013-11-27 DIAGNOSIS — M79609 Pain in unspecified limb: Secondary | ICD-10-CM

## 2013-11-27 DIAGNOSIS — M109 Gout, unspecified: Secondary | ICD-10-CM | POA: Insufficient documentation

## 2013-11-27 DIAGNOSIS — M25476 Effusion, unspecified foot: Secondary | ICD-10-CM | POA: Insufficient documentation

## 2013-11-27 DIAGNOSIS — J4489 Other specified chronic obstructive pulmonary disease: Secondary | ICD-10-CM | POA: Insufficient documentation

## 2013-11-27 DIAGNOSIS — M25579 Pain in unspecified ankle and joints of unspecified foot: Secondary | ICD-10-CM | POA: Insufficient documentation

## 2013-11-27 DIAGNOSIS — K219 Gastro-esophageal reflux disease without esophagitis: Secondary | ICD-10-CM | POA: Insufficient documentation

## 2013-11-27 DIAGNOSIS — Z862 Personal history of diseases of the blood and blood-forming organs and certain disorders involving the immune mechanism: Secondary | ICD-10-CM | POA: Insufficient documentation

## 2013-11-27 DIAGNOSIS — Z792 Long term (current) use of antibiotics: Secondary | ICD-10-CM | POA: Insufficient documentation

## 2013-11-27 DIAGNOSIS — M79672 Pain in left foot: Secondary | ICD-10-CM

## 2013-11-27 DIAGNOSIS — F319 Bipolar disorder, unspecified: Secondary | ICD-10-CM | POA: Insufficient documentation

## 2013-11-27 DIAGNOSIS — F17200 Nicotine dependence, unspecified, uncomplicated: Secondary | ICD-10-CM | POA: Insufficient documentation

## 2013-11-27 DIAGNOSIS — M25473 Effusion, unspecified ankle: Secondary | ICD-10-CM | POA: Insufficient documentation

## 2013-11-27 DIAGNOSIS — J45909 Unspecified asthma, uncomplicated: Secondary | ICD-10-CM | POA: Insufficient documentation

## 2013-11-27 DIAGNOSIS — M79671 Pain in right foot: Secondary | ICD-10-CM

## 2013-11-27 DIAGNOSIS — Z79899 Other long term (current) drug therapy: Secondary | ICD-10-CM | POA: Insufficient documentation

## 2013-11-27 DIAGNOSIS — Z8619 Personal history of other infectious and parasitic diseases: Secondary | ICD-10-CM | POA: Insufficient documentation

## 2013-11-27 DIAGNOSIS — J449 Chronic obstructive pulmonary disease, unspecified: Secondary | ICD-10-CM | POA: Insufficient documentation

## 2013-11-27 DIAGNOSIS — M7989 Other specified soft tissue disorders: Secondary | ICD-10-CM

## 2013-11-27 DIAGNOSIS — E876 Hypokalemia: Secondary | ICD-10-CM | POA: Insufficient documentation

## 2013-11-27 DIAGNOSIS — IMO0002 Reserved for concepts with insufficient information to code with codable children: Secondary | ICD-10-CM | POA: Insufficient documentation

## 2013-11-27 DIAGNOSIS — Z8742 Personal history of other diseases of the female genital tract: Secondary | ICD-10-CM | POA: Insufficient documentation

## 2013-11-27 DIAGNOSIS — I10 Essential (primary) hypertension: Secondary | ICD-10-CM | POA: Insufficient documentation

## 2013-11-27 LAB — RAPID URINE DRUG SCREEN, HOSP PERFORMED
AMPHETAMINES: NOT DETECTED
BARBITURATES: NOT DETECTED
Benzodiazepines: NOT DETECTED
Cocaine: POSITIVE — AB
Opiates: NOT DETECTED
TETRAHYDROCANNABINOL: NOT DETECTED

## 2013-11-27 LAB — COMPREHENSIVE METABOLIC PANEL
ALBUMIN: 2.3 g/dL — AB (ref 3.5–5.2)
ALK PHOS: 131 U/L — AB (ref 39–117)
ALT: 25 U/L (ref 0–35)
AST: 83 U/L — AB (ref 0–37)
BILIRUBIN TOTAL: 0.5 mg/dL (ref 0.3–1.2)
BUN: 15 mg/dL (ref 6–23)
CHLORIDE: 97 meq/L (ref 96–112)
CO2: 31 mEq/L (ref 19–32)
Calcium: 8.2 mg/dL — ABNORMAL LOW (ref 8.4–10.5)
Creatinine, Ser: 1.22 mg/dL — ABNORMAL HIGH (ref 0.50–1.10)
GFR calc Af Amer: 55 mL/min — ABNORMAL LOW (ref >90)
GFR calc non Af Amer: 48 mL/min — ABNORMAL LOW (ref >90)
Glucose, Bld: 103 mg/dL — ABNORMAL HIGH (ref 70–99)
POTASSIUM: 2.9 meq/L — AB (ref 3.7–5.3)
Sodium: 135 mEq/L — ABNORMAL LOW (ref 137–147)
Total Protein: 6.5 g/dL (ref 6.0–8.3)

## 2013-11-27 LAB — CBC WITH DIFFERENTIAL/PLATELET
BASOS ABS: 0 10*3/uL (ref 0.0–0.1)
Basophils Relative: 1 % (ref 0–1)
EOS PCT: 3 % (ref 0–5)
Eosinophils Absolute: 0 10*3/uL (ref 0.0–0.7)
HEMATOCRIT: 29.5 % — AB (ref 36.0–46.0)
HEMOGLOBIN: 10 g/dL — AB (ref 12.0–15.0)
LYMPHS PCT: 42 % (ref 12–46)
Lymphs Abs: 0.6 10*3/uL — ABNORMAL LOW (ref 0.7–4.0)
MCH: 29.9 pg (ref 26.0–34.0)
MCHC: 33.9 g/dL (ref 30.0–36.0)
MCV: 88.3 fL (ref 78.0–100.0)
MONOS PCT: 21 % — AB (ref 3–12)
Monocytes Absolute: 0.3 10*3/uL (ref 0.1–1.0)
NEUTROS ABS: 0.4 10*3/uL — AB (ref 1.7–7.7)
Neutrophils Relative %: 33 % — ABNORMAL LOW (ref 43–77)
Platelets: 42 10*3/uL — ABNORMAL LOW (ref 150–400)
RBC: 3.34 MIL/uL — AB (ref 3.87–5.11)
RDW: 16.2 % — AB (ref 11.5–15.5)
WBC: 1.3 10*3/uL — AB (ref 4.0–10.5)

## 2013-11-27 LAB — URINALYSIS, ROUTINE W REFLEX MICROSCOPIC
Bilirubin Urine: NEGATIVE
GLUCOSE, UA: NEGATIVE mg/dL
HGB URINE DIPSTICK: NEGATIVE
KETONES UR: NEGATIVE mg/dL
Leukocytes, UA: NEGATIVE
Nitrite: NEGATIVE
PROTEIN: NEGATIVE mg/dL
Specific Gravity, Urine: 1.007 (ref 1.005–1.030)
Urobilinogen, UA: 1 mg/dL (ref 0.0–1.0)
pH: 7.5 (ref 5.0–8.0)

## 2013-11-27 LAB — HEPATITIS PANEL, ACUTE
HCV Ab: REACTIVE — AB
Hep A IgM: NONREACTIVE
Hep B C IgM: NONREACTIVE
Hepatitis B Surface Ag: NEGATIVE

## 2013-11-27 LAB — URIC ACID: URIC ACID, SERUM: 6.9 mg/dL (ref 2.4–7.0)

## 2013-11-27 LAB — TSH: TSH: 2.17 u[IU]/mL (ref 0.350–4.500)

## 2013-11-27 LAB — LITHIUM LEVEL: Lithium Lvl: <0.25 mEq/L — ABNORMAL LOW (ref 0.80–1.40)

## 2013-11-27 LAB — RAPID HIV SCREEN (WH-MAU): Rapid HIV Screen: NONREACTIVE

## 2013-11-27 MED ORDER — PREDNISONE 20 MG PO TABS: ORAL_TABLET | ORAL | Status: DC

## 2013-11-27 MED ORDER — POTASSIUM CHLORIDE 10 MEQ/100ML IV SOLN: 10.0000 meq | Freq: Once | INTRAVENOUS | Status: DC

## 2013-11-27 MED ORDER — KETOROLAC TROMETHAMINE 60 MG/2ML IM SOLN
60.0000 mg | Freq: Once | INTRAMUSCULAR | Status: AC
  Administered 2013-11-27: 60 mg via INTRAMUSCULAR
  Filled 2013-11-27: qty 2

## 2013-11-27 MED ORDER — POTASSIUM CHLORIDE 10 MEQ/100ML IV SOLN
10.0000 meq | Freq: Once | INTRAVENOUS | Status: AC
Start: 2013-11-27 — End: 2013-11-27
  Administered 2013-11-27: 10 meq via INTRAVENOUS
  Filled 2013-11-27: qty 100

## 2013-11-27 MED ORDER — POTASSIUM CHLORIDE CRYS ER 20 MEQ PO TBCR
40.0000 meq | EXTENDED_RELEASE_TABLET | Freq: Once | ORAL | Status: AC
  Administered 2013-11-27: 40 meq via ORAL
  Filled 2013-11-27: qty 2

## 2013-11-27 MED ORDER — COLCHICINE 0.6 MG PO TABS
0.6000 mg | ORAL_TABLET | Freq: Once | ORAL | Status: AC
  Administered 2013-11-27: 0.6 mg via ORAL
  Filled 2013-11-27: qty 1

## 2013-11-27 MED ORDER — POTASSIUM CHLORIDE CRYS ER 20 MEQ PO TBCR: 10.0000 meq | EXTENDED_RELEASE_TABLET | Freq: Once | ORAL | Status: DC

## 2013-11-27 MED ORDER — COLCHICINE 0.6 MG PO TABS: 0.6000 mg | ORAL_TABLET | Freq: Two times a day (BID) | ORAL | Status: DC

## 2013-11-27 NOTE — Progress Notes (Addendum)
*  Preliminary Results* Bilateral lower extremity venous duplex completed. Bilateral lower extremities are negative for deep vein thrombosis. There is evidence of Baker's cyst bilaterally.  11/27/2013  Maudry Mayhew, RVT, RDCS, RDMS

## 2013-11-27 NOTE — ED Provider Notes (Signed)
CSN: [id number]     Arrival date & time 11/27/13  1355 History   First MD Initiated Contact with Patient 11/27/13 1504     Chief Complaint  Patient presents with  . Gout     (Consider location/radiation/quality/duration/timing/severity/associated sxs/prior Treatment) HPI Patient reports she started having one and a half weeks ago swelling of her feet and feeling a fullness or fluid behind her knees at the end of the day. She reports her feet started hurting now 2 weeks ago when she fell off the bed twice while she was trying to hammer up balloons on the wall from her grand child's birthday party. She states she has a history of gout but normally it bothers her in the knees. She states she usually does not get swelling when she has her gout attacks. She denies any fevers. She states she called her doctor's office and they were discussing increasing her fluid pills. She was seen 2 days ago to get blood work however she was not seen by her physician. She does not take any specific medications for her gout.  PCP Dr. [name]   Past Medical History  Diagnosis Date  . Hypertension   . Bipolar 1 disorder   . Menorrhagia   . Post-menopausal bleeding     MOST RECENT BLEEDING  WAS NOV 2013  . Paresthesia   . COPD (chronic obstructive pulmonary disease)   . 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 Gout   . Mental disorder     bipolar  . Asthma   . Shortness of breath   . Anemia   . GERD (gastroesophageal reflux disease)   . Gallstones     ABD PAIN, N&V  . Arthritis   . Headache(784.0)   . Hepatitis     Hep C x 10 yrs  - NO TREATMENT   Past Surgical History  Procedure Laterality Date  . Tubal ligation    . Facial reconstruction surgery    . Multiple tooth extractions    . Hysteroscopy w/d&c  09/29/2011    Procedure: DILATATION AND CURETTAGE /HYSTEROSCOPY;  Surgeon: Mora Bellman, MD;  Location: [facility];  Service: Gynecology;  Laterality: N/A;  . Cholecystectomy  08/20/2012   Procedure: LAPAROSCOPIC CHOLECYSTECTOMY;  Surgeon: Ralene Ok, MD;  Location: [facility];  Service: General;;   Family History  Problem Relation Age of Onset  . COPD Mother   . Diabetes Mother   . Heart disease Mother   . Diabetes Father   . Heart disease Father   . Hypertension Father    History  Substance Use Topics  . Smoking status: Current Every Day Smoker -- 2.00 packs/day for 30 years    Types: Cigarettes  . Smokeless tobacco: Never Used     Comment: in process of quitting---would like nicotine patch if insurance covers it  . Alcohol Use: No     Comment: recovering alcoholic    NO COCAINE SINCE 1990   Lives alone States she is down to 4 cigarettes a day Hx of cocaine abuse  OB History   Grav Para Term Preterm Abortions TAB SAB Ect Mult Living   8 4 4  0 4 1 3   4      Review of Systems  All other systems reviewed and are negative.     Allergies  Review of patient's allergies indicates no known allergies.  Home Medications   Prior to Admission medications   Medication Sig Start Date End Date  Taking? Authorizing Provider  budesonide-formoterol (SYMBICORT) 160-4.5 MCG/ACT inhaler Inhale 2 puffs into the lungs 2 (two) times daily. 05/24/12   Jacquelyn A McGill, MD  clindamycin (CLEOCIN) 150 MG capsule Take 2 capsules (300 mg total) by mouth 3 (three) times daily. May dispense as 150mg  capsules 01/09/13   Noland Fordyce, PA-C  clonazePAM (KLONOPIN) 1 MG tablet Take 1 mg by mouth 4 (four) times daily as needed for anxiety.     Historical Provider, MD  furosemide (LASIX) 20 MG tablet Take 20 mg by mouth daily.    Historical Provider, MD  hydrochlorothiazide (HYDRODIURIL) 25 MG tablet Take 25 mg by mouth at bedtime.  07/20/12   Historical Provider, MD  HYDROcodone-acetaminophen (NORCO/VICODIN) 5-325 MG per tablet Take 1 tablet by mouth every 6 (six) hours as needed for pain.    Historical Provider, MD  HYDROcodone-acetaminophen (NORCO/VICODIN) 5-325 MG per tablet Take 1  tablet by mouth every 4 (four) hours as needed for severe pain. 08/18/13   Hannah Muthersbaugh, PA-C  indomethacin (INDOCIN) 25 MG capsule Take 25 mg by mouth 3 (three) times daily as needed (for gout).    Historical Provider, MD  lithium carbonate 300 MG capsule Take 1-2 capsules (300-600 mg total) by mouth 3 (three) times daily with meals. Pt takes 2 capsules in the morning, 1 midday, and 1 at bedtime 01/06/13   Jasper Riling. Pickering, MD  omeprazole (PRILOSEC) 20 MG capsule Take 20 mg by mouth daily.    Historical Provider, MD  pregabalin (LYRICA) 75 MG capsule Take 75 mg by mouth 2 (two) times daily.    Historical Provider, MD  traMADol (ULTRAM) 50 MG tablet Take 50 mg by mouth every 8 (eight) hours as needed for pain.     Historical Provider, MD  zolpidem (AMBIEN) 10 MG tablet Take 10 mg by mouth at bedtime.    Historical Provider, MD     ED Triage Vitals  Enc Vitals Group     BP 11/27/13 1404 127/71 mmHg     Pulse Rate 11/27/13 1404 71     Resp 11/27/13 1404 20     Temp 11/27/13 1404 98.7 F (37.1 C)     Temp src 11/27/13 1404 Oral     SpO2 11/27/13 1404 100 %     Weight --      Height --      Head Cir --      Peak Flow --      Pain Score 11/27/13 1402 9     Pain Loc --      Pain Edu? --      Excl. in [place]? --      Vital signs normal    Physical Exam  Nursing note and vitals reviewed. Constitutional: She is oriented to person, place, and time. She appears well-developed and well-nourished.  Non-toxic appearance. She does not appear ill. No distress.  HENT:  Head: Normocephalic and atraumatic.  Right Ear: External ear normal.  Left Ear: External ear normal.  Nose: Nose normal. No mucosal edema or rhinorrhea.  Mouth/Throat: Oropharynx is clear and moist and mucous membranes are normal. No dental abscesses or uvula swelling.  Eyes: Conjunctivae and EOM are normal. Pupils are equal, round, and reactive to light.  Neck: Normal range of motion and full passive range of motion  without pain. Neck supple.  Cardiovascular: Normal rate, regular rhythm and normal heart sounds.  Exam reveals no gallop and no friction rub.   No murmur heard. Pulmonary/Chest: Effort normal and  breath sounds normal. No respiratory distress. She has no wheezes. She has no rhonchi. She has no rales. She exhibits no tenderness and no crepitus.  Abdominal: Soft. Normal appearance and bowel sounds are normal. She exhibits no distension. There is no tenderness. There is no rebound and no guarding.  Musculoskeletal: Normal range of motion. She exhibits edema and tenderness.  Moves all extremities well. Patient has no obvious masses in her popliteal area except maybe some mild fullness on the left. She's noted to have bilateral swelling of her ankles and the dorsum of her feet. There is no redness. There is no localized tenderness to palpation of her feet.  Neurological: She is alert and oriented to person, place, and time. She has normal strength. No cranial nerve deficit.  Skin: Skin is warm, dry and intact. No rash noted. No erythema. No pallor.  Psychiatric: She has a normal mood and affect. Her speech is normal and behavior is normal. Her mood appears not anxious.    ED Course  Procedures (including critical care time)  Medications  potassium chloride 10 mEq in 100 mL IVPB (not administered)  potassium chloride SA (K-DUR,KLOR-CON) CR tablet 40 mEq (not administered)  colchicine tablet 0.6 mg (0.6 mg Oral Given 11/27/13 1615)  ketorolac (TORADOL) injection 60 mg (60 mg Intramuscular Given 11/27/13 1615)   Patient seen ambulatory to the bathroom without limping or apparent difficulty walking.  1700 patient states her pain is much better after the medications that when she was given. We discussed her blood test results. She states she just had blood work done 2 days ago at her primary care doctor's office for the first time in a long time and she did not condo results yet. We discussed need to check  her for blood clots in her legs and she is agreeable. Doppler ultrasound was ordered.   Review of her prior visits shows during her cholecystectomy and her surgeon said her liver looked like she had cirrhosis. She's been seen by behavioral health for chronic cocaine abuse for at least 12 years and suicidal ideation.  18:49 Dr Shanon Brow, suggests talking to heme-onc to see if she needs admission or transfer to [facility].   19:02 Dr Earnest Conroy, hematology, suggests stopping indocin and all OTC NSAIDs and put on prednisone instead for her gout, suggests adding hepatitis panel, ANA, Thyroid tests, Vit B12 and lymphocyte flow cytometry. Should return to the ED if she gets a fever. Otherwise she can see her PCP this week and be seen in the hematology clinic in about a week.     Author: Elmo Putt Service: Vascular Lab Author Type: Cardiovascular Sonographer   Filed: 11/27/2013 6:28 PM Note Time: 11/27/2013 6:21 PM Status: Addendum   Editor: Doyne Keel Simonetti (Cardiovascular Sonographer)     Related Notes: Original Note by Elmo Putt (Cardiovascular Sonographer) filed at 11/27/2013 6:22 PM    *Preliminary Results*  Bilateral lower extremity venous duplex completed.  Bilateral lower extremities are negative for deep vein thrombosis. There is evidence of Baker's cyst bilaterally.  11/27/2013  Maudry Mayhew, RVT, RDCS, RDMS           Labs Review Results for orders placed during the hospital encounter of 11/27/13  URIC ACID      Result Value Ref Range   Uric Acid, Serum 6.9  2.4 - 7.0 mg/dL  CBC WITH DIFFERENTIAL      Result Value Ref Range   WBC 1.3 (*) 4.0 - 10.5 K/uL   RBC  3.34 (*) 3.87 - 5.11 MIL/uL   Hemoglobin 10.0 (*) 12.0 - 15.0 g/dL   HCT 29.5 (*) 36.0 - 46.0 %   MCV 88.3  78.0 - 100.0 fL   MCH 29.9  26.0 - 34.0 pg   MCHC 33.9  30.0 - 36.0 g/dL   RDW 16.2 (*) 11.5 - 15.5 %   Platelets 42 (*) 150 - 400 K/uL   Neutrophils Relative % 33 (*) 43 - 77 %   Lymphocytes  Relative 42  12 - 46 %   Monocytes Relative 21 (*) 3 - 12 %   Eosinophils Relative 3  0 - 5 %   Basophils Relative 1  0 - 1 %   Neutro Abs 0.4 (*) 1.7 - 7.7 K/uL   Lymphs Abs 0.6 (*) 0.7 - 4.0 K/uL   Monocytes Absolute 0.3  0.1 - 1.0 K/uL   Eosinophils Absolute 0.0  0.0 - 0.7 K/uL   Basophils Absolute 0.0  0.0 - 0.1 K/uL   Smear Review MORPHOLOGY UNREMARKABLE    COMPREHENSIVE METABOLIC PANEL      Result Value Ref Range   Sodium 135 (*) 137 - 147 mEq/L   Potassium 2.9 (*) 3.7 - 5.3 mEq/L   Chloride 97  96 - 112 mEq/L   CO2 31  19 - 32 mEq/L   Glucose, Bld 103 (*) 70 - 99 mg/dL   BUN 15  6 - 23 mg/dL   Creatinine, Ser 1.22 (*) 0.50 - 1.10 mg/dL   Calcium 8.2 (*) 8.4 - 10.5 mg/dL   Total Protein 6.5  6.0 - 8.3 g/dL   Albumin 2.3 (*) 3.5 - 5.2 g/dL   AST 83 (*) 0 - 37 U/L   ALT 25  0 - 35 U/L   Alkaline Phosphatase 131 (*) 39 - 117 U/L   Total Bilirubin 0.5  0.3 - 1.2 mg/dL   GFR calc non Af Amer 48 (*) >90 mL/min   GFR calc Af Amer 55 (*) >90 mL/min  URINALYSIS, ROUTINE W REFLEX MICROSCOPIC      Result Value Ref Range   Color, Urine YELLOW  YELLOW   APPearance CLEAR  CLEAR   Specific Gravity, Urine 1.007  1.005 - 1.030   pH 7.5  5.0 - 8.0   Glucose, UA NEGATIVE  NEGATIVE mg/dL   Hgb urine dipstick NEGATIVE  NEGATIVE   Bilirubin Urine NEGATIVE  NEGATIVE   Ketones, ur NEGATIVE  NEGATIVE mg/dL   Protein, ur NEGATIVE  NEGATIVE mg/dL   Urobilinogen, UA 1.0  0.0 - 1.0 mg/dL   Nitrite NEGATIVE  NEGATIVE   Leukocytes, UA NEGATIVE  NEGATIVE  URINE RAPID DRUG SCREEN (HOSP PERFORMED)      Result Value Ref Range   Opiates NONE DETECTED  NONE DETECTED   Cocaine POSITIVE (*) NONE DETECTED   Benzodiazepines NONE DETECTED  NONE DETECTED   Amphetamines NONE DETECTED  NONE DETECTED   Tetrahydrocannabinol NONE DETECTED  NONE DETECTED   Barbiturates NONE DETECTED  NONE DETECTED   Laboratory interpretation all normal except pancytopenia, hyponatremia mild, hypokalemia, malnutrition, renal  insufficiency   Imaging Review Dg Foot Complete Left  11/27/2013   CLINICAL DATA:  Fall 2 weeks ago.  EXAM: LEFT FOOT - COMPLETE 3+ VIEW  COMPARISON:  01/06/2013  FINDINGS: Exam demonstrates moderate degenerative change over the first MTP joint. There is no acute fracture or dislocation. There is a small calcaneal spur.  IMPRESSION: No acute findings.   Electronically Signed   By: Quillian Quince  Derrel Nip M.D.   On: 11/27/2013 16:48   Dg Foot Complete Right  11/27/2013   CLINICAL DATA:  Pain and swelling in both feet.  EXAM: RIGHT FOOT COMPLETE - 3+ VIEW  COMPARISON:  Plain films right foot 01/06/2013.  FINDINGS: No acute or focal bony or joint abnormality is identified. Soft tissues of the dorsum of foot appear mildly swollen.  IMPRESSION: Soft tissue swelling without bony or joint abnormality.   Electronically Signed   By: Inge Rise M.D.   On: 11/27/2013 16:49     EKG Interpretation None       Date: 11/27/2013  Rate: 60  Rhythm: normal sinus rhythm  QRS Axis: normal  Intervals: normal  ST/T Wave abnormalities: nonspecific ST/T changes  Conduction Disutrbances:none  Narrative Interpretation:   Old EKG Reviewed: unchanged from Jan 2014    MDM   Final diagnoses:  Pancytopenia  Hypokalemia  Bilateral foot pain  Foot swelling    New Prescriptions   COLCHICINE (COLCRYS) 0.6 MG TABLET    Take 1 tablet (0.6 mg total) by mouth 2 (two) times daily.   PREDNISONE (DELTASONE) 20 MG TABLET    Take 3 po QD x 3d , then 2 po QD x 3d then 1 po QD x 3d    Plan discharge  Rolland Porter, MD, Alanson Aly, MD 11/27/13 2004

## 2013-11-27 NOTE — Discharge Instructions (Signed)
Stop the indomethacin and don't take any OTC medications like ibuprofen, motrin, aleve, naprosyn. Take the prednisone and the colcrys for your gout. Follow up with Dr [name] this week, call to make an appointment at the hematology clinic for about 1 week from now. If you should get a fever, go to the ED to be evaluated.

## 2013-11-27 NOTE — ED Notes (Signed)
Pt presents swelling and pain to BLE x2 weeks. Pt reports increase pain posterior knees when sleeping, pt states she has been elevating her legs with no relief. Pt has a hx of gout and feels this is a gout flare up. Pt also reports falling off her bed x2 in the last week while hanging pictures on her wall

## 2013-11-28 LAB — PATHOLOGIST SMEAR REVIEW

## 2013-11-28 LAB — T4: T4 TOTAL: 11.6 ug/dL (ref 5.0–12.5)

## 2013-11-28 LAB — ANA: ANA: NEGATIVE

## 2013-11-28 LAB — VITAMIN B12

## 2013-11-30 ENCOUNTER — Telehealth: Payer: Self-pay | Admitting: Hematology and Oncology

## 2013-11-30 NOTE — Telephone Encounter (Signed)
C/D 11/30/13 for appt. 12/07/13

## 2013-11-30 NOTE — Telephone Encounter (Signed)
CALLED PT IN REF TO HOSP. F/U- LEFT VM TO RETURN CALL- APPT-12/07/13@9 ;30 MAILED NP PACKET DR. Earnest Conroy SAW PT IN HOSP.

## 2013-12-01 ENCOUNTER — Emergency Department (HOSPITAL_COMMUNITY): Payer: Medicare Other

## 2013-12-01 ENCOUNTER — Encounter (HOSPITAL_COMMUNITY): Payer: Self-pay | Admitting: Emergency Medicine

## 2013-12-01 ENCOUNTER — Emergency Department (INDEPENDENT_AMBULATORY_CARE_PROVIDER_SITE_OTHER)
Admission: EM | Admit: 2013-12-01 | Discharge: 2013-12-01 | Disposition: A | Discharge status: Nursing Home (Includes ICF) | Payer: Medicare Other | Source: Home / Self Care | Attending: Family Medicine | Admitting: Family Medicine

## 2013-12-01 ENCOUNTER — Emergency Department (HOSPITAL_COMMUNITY)
Admission: EM | Admit: 2013-12-01 | Discharge: 2013-12-01 | Disposition: A | Discharge status: Home / Self Care | Payer: Medicare Other | Attending: Emergency Medicine | Admitting: Emergency Medicine

## 2013-12-01 DIAGNOSIS — IMO0002 Reserved for concepts with insufficient information to code with codable children: Secondary | ICD-10-CM | POA: Insufficient documentation

## 2013-12-01 DIAGNOSIS — F319 Bipolar disorder, unspecified: Secondary | ICD-10-CM | POA: Insufficient documentation

## 2013-12-01 DIAGNOSIS — M109 Gout, unspecified: Secondary | ICD-10-CM | POA: Insufficient documentation

## 2013-12-01 DIAGNOSIS — F1910 Other psychoactive substance abuse, uncomplicated: Secondary | ICD-10-CM | POA: Diagnosis not present

## 2013-12-01 DIAGNOSIS — J449 Chronic obstructive pulmonary disease, unspecified: Secondary | ICD-10-CM | POA: Insufficient documentation

## 2013-12-01 DIAGNOSIS — Y9389 Activity, other specified: Secondary | ICD-10-CM | POA: Insufficient documentation

## 2013-12-01 DIAGNOSIS — Y92009 Unspecified place in unspecified non-institutional (private) residence as the place of occurrence of the external cause: Secondary | ICD-10-CM

## 2013-12-01 DIAGNOSIS — W1809XA Striking against other object with subsequent fall, initial encounter: Secondary | ICD-10-CM | POA: Insufficient documentation

## 2013-12-01 DIAGNOSIS — W19XXXA Unspecified fall, initial encounter: Secondary | ICD-10-CM

## 2013-12-01 DIAGNOSIS — S199XXA Unspecified injury of neck, initial encounter: Secondary | ICD-10-CM

## 2013-12-01 DIAGNOSIS — F29 Unspecified psychosis not due to a substance or known physiological condition: Secondary | ICD-10-CM | POA: Diagnosis not present

## 2013-12-01 DIAGNOSIS — F17200 Nicotine dependence, unspecified, uncomplicated: Secondary | ICD-10-CM | POA: Insufficient documentation

## 2013-12-01 DIAGNOSIS — R51 Headache: Secondary | ICD-10-CM | POA: Insufficient documentation

## 2013-12-01 DIAGNOSIS — R410 Disorientation, unspecified: Secondary | ICD-10-CM

## 2013-12-01 DIAGNOSIS — Z8742 Personal history of other diseases of the female genital tract: Secondary | ICD-10-CM | POA: Insufficient documentation

## 2013-12-01 DIAGNOSIS — Z79899 Other long term (current) drug therapy: Secondary | ICD-10-CM | POA: Insufficient documentation

## 2013-12-01 DIAGNOSIS — K219 Gastro-esophageal reflux disease without esophagitis: Secondary | ICD-10-CM | POA: Insufficient documentation

## 2013-12-01 DIAGNOSIS — S0990XA Unspecified injury of head, initial encounter: Secondary | ICD-10-CM | POA: Insufficient documentation

## 2013-12-01 DIAGNOSIS — S0993XA Unspecified injury of face, initial encounter: Secondary | ICD-10-CM | POA: Insufficient documentation

## 2013-12-01 DIAGNOSIS — I10 Essential (primary) hypertension: Secondary | ICD-10-CM | POA: Insufficient documentation

## 2013-12-01 DIAGNOSIS — J4489 Other specified chronic obstructive pulmonary disease: Secondary | ICD-10-CM | POA: Insufficient documentation

## 2013-12-01 DIAGNOSIS — Z8659 Personal history of other mental and behavioral disorders: Secondary | ICD-10-CM | POA: Insufficient documentation

## 2013-12-01 DIAGNOSIS — Z862 Personal history of diseases of the blood and blood-forming organs and certain disorders involving the immune mechanism: Secondary | ICD-10-CM | POA: Insufficient documentation

## 2013-12-01 NOTE — ED Notes (Signed)
Pt sent over from urgent care. sts she fell while she was in the shower on Monday and then again yesterday. Pt c/o pain on buttocks and neck. Pt sts she gets up in the middle of her sleep and it causes her to fall because its dark. sts sleeping pill at night for sleeping. Pt falling asleep in chair then starts talking about. sts they sent her over from urgent care to have a head CT done.  Nad, skin warm and dry, resp e/u.

## 2013-12-01 NOTE — ED Provider Notes (Signed)
Dorothy Burns is a 58 y.o. female who presents to Urgent Care today for fall. Patient fell yesterday at home. She fell into the bathtub. She thinks she hit her head but denies any loss of consciousness. She notes continued back pain. She feels well with no weakness or numbness. The back pain is quite bothersome. She's been taking hydrocodone and tramadol every 2 hours which is insufficient to control her pain. She denies any fevers or chills nausea vomiting or diarrhea. She denies any significant neck pain.   Past Medical History  Diagnosis Date  . Hypertension   . Bipolar 1 disorder   . Menorrhagia   . Post-menopausal bleeding     MOST RECENT BLEEDING  WAS NOV 2013  . Paresthesia   . COPD (chronic obstructive pulmonary disease)   . 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 Gout   . Mental disorder     bipolar  . Asthma   . Shortness of breath   . Anemia   . GERD (gastroesophageal reflux disease)   . Gallstones     ABD PAIN, N&V  . Arthritis   . Headache(784.0)   . Hepatitis     Hep C x 10 yrs  - NO TREATMENT   History  Substance Use Topics  . Smoking status: Current Every Day Smoker -- 2.00 packs/day for 30 years    Types: Cigarettes  . Smokeless tobacco: Never Used     Comment: in process of quitting---would like nicotine patch if insurance covers it  . Alcohol Use: No     Comment: recovering alcoholic    NO COCAINE SINCE 1990   ROS as above Medications: No current facility-administered medications for this encounter.   Current Outpatient Prescriptions  Medication Sig Dispense Refill  . budesonide-formoterol (SYMBICORT) 160-4.5 MCG/ACT inhaler Inhale 2 puffs into the lungs 2 (two) times daily.  1 Inhaler  3  . clindamycin (CLEOCIN) 150 MG capsule Take 2 capsules (300 mg total) by mouth 3 (three) times daily. May dispense as 150mg  capsules  60 capsule  0  . clonazePAM (KLONOPIN) 1 MG tablet Take 1 mg by mouth 4 (four) times daily as needed for anxiety.        . colchicine (COLCRYS) 0.6 MG tablet Take 1 tablet (0.6 mg total) by mouth 2 (two) times daily.  30 tablet  0  . Cyanocobalamin (B-12 PO) Take 1 capsule by mouth daily.      . divalproex (DEPAKOTE) 250 MG DR tablet Take 250 mg by mouth 2 (two) times daily.      . furosemide (LASIX) 20 MG tablet Take 20 mg by mouth daily.      . hydrochlorothiazide (HYDRODIURIL) 25 MG tablet Take 25 mg by mouth at bedtime.       Marland Kitchen HYDROcodone-acetaminophen (NORCO/VICODIN) 5-325 MG per tablet Take 1 tablet by mouth every 6 (six) hours as needed for pain.      . indomethacin (INDOCIN) 25 MG capsule Take 25 mg by mouth 3 (three) times daily as needed (for gout).      Marland Kitchen omeprazole (PRILOSEC) 20 MG capsule Take 20 mg by mouth daily.      . predniSONE (DELTASONE) 20 MG tablet Take 3 po QD x 3d , then 2 po QD x 3d then 1 po QD x 3d  18 tablet  0  . pregabalin (LYRICA) 75 MG capsule Take 75 mg by mouth 2 (two) times daily.      Marland Kitchen  Thiamine HCl (B-1 PO) Take 1 capsule by mouth daily.      . traMADol (ULTRAM) 50 MG tablet Take 50 mg by mouth every 8 (eight) hours as needed for pain.       Marland Kitchen zolpidem (AMBIEN) 10 MG tablet Take 10 mg by mouth at bedtime.        Exam:  BP 144/80  Pulse 74  Temp(Src) 97.6 F (36.4 C) (Oral)  Resp 18  SpO2 100%  LMP 05/21/2012 Gen: Fatigued appearing HEENT: EOMI left limited on right,  MMM RIGHT eye disconjugate gaze. Left eye is normal. PERRLA Lungs: Normal work of breathing. CTABL Heart: RRR no MRG Abd: NABS, Soft. NT, ND Exts: Brisk capillary refill, warm and well perfused.  Neck: Nontender to spinal midline. Full neck range of motion. Reflexes intact bilateral upper and lower extremity Neuro: Fatigued appearing. Speech is somewhat slurred.  Oriented to doctor's office and Thursday. Initially somewhat confused the day of the week.  Unable to complete months of the year backwards.  3 item recall is intact immediately and delayed by 5 minutes Balance is intact as is  coordination.  No results found for this or any previous visit (from the past 24 hour(s)). No results found.  Assessment and Plan: 58 y.o. female with fall at home with possible head injury with subsequent confusion.  This is certainly complicated by patient's polypharmacy with Klonopin, Depakote, gabapentin, tramadol, hydrocodone and Ambien.  Additionally patient has had a recent positive urine drug screen for cocaine.  Plan to transfer to the emergency room for evaluation and management of her confusion associated with head injury.  Discussed warning signs or symptoms. Please see discharge instructions. Patient expresses understanding.    Gregor Hams, MD 12/01/13 [phone]

## 2013-12-01 NOTE — ED Notes (Signed)
Pt triaged and assessed by provider.   Provider in before nurse. 

## 2013-12-01 NOTE — Discharge Instructions (Signed)

## 2013-12-01 NOTE — ED Notes (Signed)
Pt states she has gout in her feet and they are both swollen.

## 2013-12-01 NOTE — ED Provider Notes (Signed)
CSN: [id number]     Arrival date & time 12/01/13  1802 History   First MD Initiated Contact with Patient 12/01/13 1851     Chief Complaint  Patient presents with  . Fall     (Consider location/radiation/quality/duration/timing/severity/associated sxs/prior Treatment) Patient is a 58 y.o. female presenting with fall. The history is provided by the patient.  Fall This is a new problem. The current episode started 12 to 24 hours ago. Episode frequency: once. The problem has been resolved. Associated symptoms include headaches. Pertinent negatives include no chest pain, no abdominal pain and no shortness of breath. Nothing aggravates the symptoms. Nothing relieves the symptoms. She has tried nothing for the symptoms.    Past Medical History  Diagnosis Date  . Hypertension   . Bipolar 1 disorder   . Menorrhagia   . Post-menopausal bleeding     MOST RECENT BLEEDING  WAS NOV 2013  . Paresthesia   . COPD (chronic obstructive pulmonary disease)   . 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 Gout   . Mental disorder     bipolar  . Asthma   . Shortness of breath   . Anemia   . GERD (gastroesophageal reflux disease)   . Gallstones     ABD PAIN, N&V  . Arthritis   . Headache(784.0)   . Hepatitis     Hep C x 10 yrs  - NO TREATMENT   Past Surgical History  Procedure Laterality Date  . Tubal ligation    . Facial reconstruction surgery    . Multiple tooth extractions    . Hysteroscopy w/d&c  09/29/2011    Procedure: DILATATION AND CURETTAGE /HYSTEROSCOPY;  Surgeon: Mora Bellman, MD;  Location: [facility];  Service: Gynecology;  Laterality: N/A;  . Cholecystectomy  08/20/2012    Procedure: LAPAROSCOPIC CHOLECYSTECTOMY;  Surgeon: Ralene Ok, MD;  Location: [facility];  Service: General;;   Family History  Problem Relation Age of Onset  . COPD Mother   . Diabetes Mother   . Heart disease Mother   . Diabetes Father   . Heart disease Father   . Hypertension Father    History   Substance Use Topics  . Smoking status: Current Every Day Smoker -- 2.00 packs/day for 30 years    Types: Cigarettes  . Smokeless tobacco: Never Used     Comment: in process of quitting---would like nicotine patch if insurance covers it  . Alcohol Use: No     Comment: recovering alcoholic    NO COCAINE SINCE 1990   OB History   Grav Para Term Preterm Abortions TAB SAB Ect Mult Living   8 4 4  0 4 1 3   4      Review of Systems  Constitutional: Negative for fever and chills.  Respiratory: Negative for shortness of breath.   Cardiovascular: Negative for chest pain.  Gastrointestinal: Negative for abdominal pain.  Neurological: Positive for headaches.  All other systems reviewed and are negative.     Allergies  Review of patient's allergies indicates no known allergies.  Home Medications   Prior to Admission medications   Medication Sig Start Date End Date Taking? Authorizing Provider  budesonide-formoterol (SYMBICORT) 160-4.5 MCG/ACT inhaler Inhale 2 puffs into the lungs 2 (two) times daily. 05/24/12  Yes Jacquelyn A McGill, MD  clonazePAM (KLONOPIN) 1 MG tablet Take 1 mg by mouth 4 (four) times daily as needed for anxiety.    Yes Historical Provider, MD  colchicine (  COLCRYS) 0.6 MG tablet Take 1 tablet (0.6 mg total) by mouth 2 (two) times daily. 11/27/13  Yes Janice Norrie, MD  Cyanocobalamin (B-12 PO) Take 1 capsule by mouth daily.   Yes Historical Provider, MD  divalproex (DEPAKOTE) 250 MG DR tablet Take 250 mg by mouth 2 (two) times daily.   Yes Historical Provider, MD  furosemide (LASIX) 20 MG tablet Take 20 mg by mouth daily.   Yes Historical Provider, MD  hydrochlorothiazide (HYDRODIURIL) 25 MG tablet Take 25 mg by mouth at bedtime.  07/20/12  Yes Historical Provider, MD  HYDROcodone-acetaminophen (NORCO/VICODIN) 5-325 MG per tablet Take 1 tablet by mouth every 6 (six) hours as needed for pain.   Yes Historical Provider, MD  omeprazole (PRILOSEC) 20 MG capsule Take 20 mg by  mouth daily.   Yes Historical Provider, MD  predniSONE (DELTASONE) 20 MG tablet Take 3 po QD x 3d , then 2 po QD x 3d then 1 po QD x 3d 11/27/13  Yes Janice Norrie, MD  pregabalin (LYRICA) 75 MG capsule Take 75 mg by mouth 2 (two) times daily.   Yes Historical Provider, MD  Thiamine HCl (B-1 PO) Take 1 capsule by mouth daily.   Yes Historical Provider, MD  traMADol (ULTRAM) 50 MG tablet Take 50 mg by mouth every 8 (eight) hours as needed for pain.    Yes Historical Provider, MD  zolpidem (AMBIEN) 10 MG tablet Take 10 mg by mouth at bedtime.   Yes Historical Provider, MD   BP 115/65  Pulse 70  Temp(Src) 98.1 F (36.7 C) (Oral)  Resp 18  SpO2 100%  LMP 05/21/2012 Physical Exam  Nursing note and vitals reviewed. Constitutional: She is oriented to person, place, and time. She appears well-developed and well-nourished. No distress.  HENT:  Head: Normocephalic and atraumatic.  Mouth/Throat: Oropharynx is clear and moist. No oropharyngeal exudate.  Eyes: EOM are normal. Pupils are equal, round, and reactive to light.  Neck: Normal range of motion. Neck supple.  Cardiovascular: Normal rate and regular rhythm.  Exam reveals no friction rub.   No murmur heard. Pulmonary/Chest: Effort normal and breath sounds normal. No respiratory distress. She has no wheezes. She has no rales.  Abdominal: Soft. She exhibits no distension. There is no tenderness. There is no rebound.  Musculoskeletal: Normal range of motion. She exhibits no edema.       Cervical back: She exhibits tenderness and bony tenderness. She exhibits normal range of motion, no swelling and no edema.       Thoracic back: She exhibits tenderness and bony tenderness. She exhibits normal range of motion, no swelling, no edema and no deformity.       Lumbar back: She exhibits tenderness and bony tenderness. She exhibits normal range of motion, no swelling, no edema and no deformity.  Neurological: She is alert and oriented to person, place, and  time. No cranial nerve deficit. She exhibits normal muscle tone. Coordination normal.  Speech is slow, she is appropriate and answers questions easily. She is ambulating well without problem.  Skin: No rash noted. She is not diaphoretic.    ED Course  Procedures (including critical care time) Labs Review Labs Reviewed - No data to display  Imaging Review Dg Chest 2 View  12/01/2013   CLINICAL DATA:  Recent traumatic injury with chest pain  EXAM: CHEST  2 VIEW  COMPARISON:  01/06/2013  FINDINGS: Cardiac shadow is within normal limits. The lungs are well aerated bilaterally. No pneumothorax  or effusion is seen. An old left clavicular fracture is again noted. No compression deformities are seen.  IMPRESSION: No acute abnormality noted.   Electronically Signed   By: Inez Catalina M.D.   On: 12/01/2013 20:45   Dg Thoracic Spine 4v  12/01/2013   CLINICAL DATA:  Recent traumatic injury with pain  EXAM: THORACIC SPINE - 4+ VIEW  COMPARISON:  None.  FINDINGS: There is no evidence of thoracic spine fracture. Alignment is normal. No other significant bone abnormalities are identified.  IMPRESSION: No acute abnormality noted.   Electronically Signed   By: Inez Catalina M.D.   On: 12/01/2013 20:46   Dg Lumbar Spine Complete  12/01/2013   CLINICAL DATA:  Low back pain following injury  EXAM: LUMBAR SPINE - COMPLETE 4+ VIEW  COMPARISON:  None.  FINDINGS: Five lumbar type vertebral bodies are well visualized. Vertebral body height is well maintained. No gross soft tissue abnormality is noted. No spondylolysis or spondylolisthesis is seen.  IMPRESSION: No acute abnormality noted.   Electronically Signed   By: Inez Catalina M.D.   On: 12/01/2013 20:47   Ct Head Wo Contrast  12/01/2013   CLINICAL DATA:  Traumatic injury and pain  EXAM: CT HEAD WITHOUT CONTRAST  CT CERVICAL SPINE WITHOUT CONTRAST  TECHNIQUE: Multidetector CT imaging of the head and cervical spine was performed following the standard protocol without  intravenous contrast. Multiplanar CT image reconstructions of the cervical spine were also generated.  COMPARISON:  01/06/2013  FINDINGS: CT HEAD FINDINGS  Postsurgical changes are again noted in the anterior aspect of the right maxillary antrum as well as the right supraorbital ridge laterally. The bony calvarium is intact. Mild atrophic changes are seen. No findings to suggest acute hemorrhage, acute infarction or space-occupying mass lesion are noted.  CT CERVICAL SPINE FINDINGS  Seven cervical segments are well visualized. Disc space narrowing is noted at C5-6 and C6-7 with osteophytic changes. This contributes to bilateral neural foraminal narrowing at these levels. No acute fracture or acute facet abnormality is seen. The surrounding soft tissues are within normal limits.  IMPRESSION: CT head:  No acute intracranial abnormality is noted.  CT of the cervical spine: Degenerative change without acute abnormality.   Electronically Signed   By: Inez Catalina M.D.   On: 12/01/2013 20:22   Ct Cervical Spine Wo Contrast  12/01/2013   CLINICAL DATA:  Traumatic injury and pain  EXAM: CT HEAD WITHOUT CONTRAST  CT CERVICAL SPINE WITHOUT CONTRAST  TECHNIQUE: Multidetector CT imaging of the head and cervical spine was performed following the standard protocol without intravenous contrast. Multiplanar CT image reconstructions of the cervical spine were also generated.  COMPARISON:  01/06/2013  FINDINGS: CT HEAD FINDINGS  Postsurgical changes are again noted in the anterior aspect of the right maxillary antrum as well as the right supraorbital ridge laterally. The bony calvarium is intact. Mild atrophic changes are seen. No findings to suggest acute hemorrhage, acute infarction or space-occupying mass lesion are noted.  CT CERVICAL SPINE FINDINGS  Seven cervical segments are well visualized. Disc space narrowing is noted at C5-6 and C6-7 with osteophytic changes. This contributes to bilateral neural foraminal narrowing at  these levels. No acute fracture or acute facet abnormality is seen. The surrounding soft tissues are within normal limits.  IMPRESSION: CT head:  No acute intracranial abnormality is noted.  CT of the cervical spine: Degenerative change without acute abnormality.   Electronically Signed   By: Linus Mako.D.  On: 12/01/2013 20:22     EKG Interpretation None      MDM   Final diagnoses:  Fall    74F presents with head injury. Fell into the tub today. Hit her head, back. She is hurting in her neck and her back. She was sent to the ED from urgent care for further imaging for her fall. She was at home and fell in the shower. She'll and her by, back, head. She not lose consciousness. She states she's been taking pain meds for this. Urgent care notes state she's been taking multiple hydrocodone throughout the day. Patient here with stable vitals. His tingling in the room upon arrival. Her speech is slow, but she is appropriate and oriented. She is following commands and has good situational awareness. Patient has some diffuse spinal pain. She has no bony deformities. She has normal neuro exam and clear lungs. Scan of head and neck are normal. All spinal imaging is normal.  Patient's father is at bedside stating she is acting like herself. There is no further confusion. Concern for possible closed head injury. She is tolerating by mouth and ambulating and she is stable for discharge   Osvaldo Shipper, MD 12/01/13 2342

## 2013-12-03 ENCOUNTER — Telehealth (HOSPITAL_BASED_OUTPATIENT_CLINIC_OR_DEPARTMENT_OTHER): Payer: Self-pay | Admitting: Emergency Medicine

## 2013-12-03 NOTE — Telephone Encounter (Signed)
Per patient's chart, patient has 10 year history of Hep C.

## 2013-12-07 ENCOUNTER — Ambulatory Visit (HOSPITAL_BASED_OUTPATIENT_CLINIC_OR_DEPARTMENT_OTHER): Payer: Medicare Other | Admitting: Hematology and Oncology

## 2013-12-07 ENCOUNTER — Telehealth: Payer: Self-pay | Admitting: Hematology and Oncology

## 2013-12-07 ENCOUNTER — Ambulatory Visit (HOSPITAL_BASED_OUTPATIENT_CLINIC_OR_DEPARTMENT_OTHER): Payer: Medicare Other

## 2013-12-07 ENCOUNTER — Encounter: Payer: Self-pay | Admitting: Hematology and Oncology

## 2013-12-07 ENCOUNTER — Ambulatory Visit: Payer: Medicare Other

## 2013-12-07 VITALS — BP 110/73 | HR 62 | Temp 98.2°F | Resp 20 | Ht 63.0 in | Wt 155.1 lb

## 2013-12-07 DIAGNOSIS — R7989 Other specified abnormal findings of blood chemistry: Secondary | ICD-10-CM

## 2013-12-07 DIAGNOSIS — R634 Abnormal weight loss: Secondary | ICD-10-CM

## 2013-12-07 DIAGNOSIS — R945 Abnormal results of liver function studies: Secondary | ICD-10-CM

## 2013-12-07 DIAGNOSIS — F17200 Nicotine dependence, unspecified, uncomplicated: Secondary | ICD-10-CM

## 2013-12-07 DIAGNOSIS — D61818 Other pancytopenia: Secondary | ICD-10-CM

## 2013-12-07 DIAGNOSIS — F1910 Other psychoactive substance abuse, uncomplicated: Secondary | ICD-10-CM

## 2013-12-07 LAB — COMPREHENSIVE METABOLIC PANEL (CC13)
ALT: 23 U/L (ref 0–55)
AST: 37 U/L — ABNORMAL HIGH (ref 5–34)
Albumin: 2.8 g/dL — ABNORMAL LOW (ref 3.5–5.0)
Alkaline Phosphatase: 138 U/L (ref 40–150)
Anion Gap: 8 mEq/L (ref 3–11)
BUN: 15.7 mg/dL (ref 7.0–26.0)
CALCIUM: 9.3 mg/dL (ref 8.4–10.4)
CO2: 28 mEq/L (ref 22–29)
Chloride: 107 mEq/L (ref 98–109)
Creatinine: 1.1 mg/dL (ref 0.6–1.1)
Glucose: 90 mg/dl (ref 70–140)
Potassium: 3.6 mEq/L (ref 3.5–5.1)
Sodium: 142 mEq/L (ref 136–145)
Total Bilirubin: 0.58 mg/dL (ref 0.20–1.20)
Total Protein: 7.2 g/dL (ref 6.4–8.3)

## 2013-12-07 LAB — CBC & DIFF AND RETIC
BASO%: 0.8 % (ref 0.0–2.0)
Basophils Absolute: 0 10e3/uL (ref 0.0–0.1)
EOS%: 1.9 % (ref 0.0–7.0)
Eosinophils Absolute: 0.1 10e3/uL (ref 0.0–0.5)
HCT: 35.3 % (ref 34.8–46.6)
HGB: 11.5 g/dL — ABNORMAL LOW (ref 11.6–15.9)
Immature Retic Fract: 9.7 % (ref 1.60–10.00)
LYMPH%: 23.7 % (ref 14.0–49.7)
MCH: 29.9 pg (ref 25.1–34.0)
MCHC: 32.6 g/dL (ref 31.5–36.0)
MCV: 91.6 fL (ref 79.5–101.0)
MONO#: 0.8 10e3/uL (ref 0.1–0.9)
MONO%: 20.3 % — ABNORMAL HIGH (ref 0.0–14.0)
NEUT#: 2.1 10e3/uL (ref 1.5–6.5)
NEUT%: 53.3 % (ref 38.4–76.8)
Platelets: 80 10e3/uL — ABNORMAL LOW (ref 145–400)
RBC: 3.86 10e6/uL (ref 3.70–5.45)
RDW: 17.3 % — ABNORMAL HIGH (ref 11.2–14.5)
Retic %: 2.35 % — ABNORMAL HIGH (ref 0.70–2.10)
Retic Ct Abs: 90.71 10e3/uL — ABNORMAL HIGH (ref 33.70–90.70)
WBC: 4 10e3/uL (ref 3.9–10.3)
lymph#: 0.9 10e3/uL (ref 0.9–3.3)

## 2013-12-07 LAB — CHCC SMEAR

## 2013-12-07 NOTE — Telephone Encounter (Signed)
pt sent back to lab and given schedule for may

## 2013-12-07 NOTE — Progress Notes (Signed)
[place] CONSULT NOTE  Patient Care Team: Elwyn Reach, MD as PCP - General (Internal Medicine)  CHIEF COMPLAINTS/PURPOSE OF CONSULTATION:  Severe pancytopenia, on background history of IV drug use  HISTORY OF PRESENTING ILLNESS:  Dorothy Burns 58 y.o. female is here because of recent abnormal blood work. The patient was feeling weak and have history of recurrent falls. Recently, she was brought into the emergency Department after another episode of fall. On 11/27/2013, blood work in the ER revealed significant pancytopenia with a white count of 1.3, hemoglobin of 10 and platelet count 42,000. She also has significant abnormalities in her liver and kidney function tests. Urine tox screen came back positive for cocaine. The patient uses cocaine on a regular basis over the last 10 years. She usually inject the cocaine into her veins.  She complained a regular nosebleeds several times a week, usually after a forceful sneezing episode of after blowing her nose. She denies hematuria or hematochezia. She denies recent recurrent infection. She has chronic nonproductive cough due to smoking. Denies over-the-counter supplements. She had history of alcoholism but not recently. She is attempting to quit smoking. She has lost 13 pounds of weight over the past 2 weeks to 2 loss of appetite. She complained of persistent pain in the sacrum area due to recent fall.  MEDICAL HISTORY:  Past Medical History  Diagnosis Date  . Hypertension   . Bipolar 1 disorder   . Menorrhagia   . Post-menopausal bleeding     MOST RECENT BLEEDING  WAS NOV 2013  . Paresthesia   . COPD (chronic obstructive pulmonary disease)   . 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 Gout   . Mental disorder     bipolar  . Asthma   . Shortness of breath   . Anemia   . GERD (gastroesophageal reflux disease)   . Gallstones     ABD PAIN, N&V  . Arthritis   . Headache(784.0)   . Hepatitis    Hep C x 10 yrs  - NO TREATMENT    SURGICAL HISTORY: Past Surgical History  Procedure Laterality Date  . Tubal ligation    . Facial reconstruction surgery    . Multiple tooth extractions    . Hysteroscopy w/d&c  09/29/2011    Procedure: DILATATION AND CURETTAGE /HYSTEROSCOPY;  Surgeon: Mora Bellman, MD;  Location: [facility];  Service: Gynecology;  Laterality: N/A;  . Cholecystectomy  08/20/2012    Procedure: LAPAROSCOPIC CHOLECYSTECTOMY;  Surgeon: Ralene Ok, MD;  Location: [facility];  Service: General;;    SOCIAL HISTORY: History   Social History  . Marital Status: Single    Spouse Name: N/A    Number of Children: N/A  . Years of Education: N/A   Occupational History  . Not on file.   Social History Main Topics  . Smoking status: Current Every Day Smoker -- 0.25 packs/day for 30 years    Types: Cigarettes  . Smokeless tobacco: Never Used     Comment: in process of quitting---would like nicotine patch if insurance covers it  . Alcohol Use: No     Comment: recovering alcoholic    NO COCAINE SINCE 1990  . Drug Use: No     Comment: alcohol  . Sexual Activity: Not Currently    Birth Control/ Protection: Post-menopausal   Other Topics Concern  . Not on file   Social History Narrative  . No narrative on file  FAMILY HISTORY: Family History  Problem Relation Age of Onset  . COPD Mother   . Diabetes Mother   . Heart disease Mother   . Diabetes Father   . Heart disease Father   . Hypertension Father   . Cancer Father     basal cell ca and prostate ca    ALLERGIES:  has No Known Allergies.  MEDICATIONS:  Current Outpatient Prescriptions  Medication Sig Dispense Refill  . budesonide-formoterol (SYMBICORT) 160-4.5 MCG/ACT inhaler Inhale 2 puffs into the lungs 2 (two) times daily.  1 Inhaler  3  . clonazePAM (KLONOPIN) 1 MG tablet Take 1 mg by mouth 4 (four) times daily as needed for anxiety.       . colchicine (COLCRYS) 0.6 MG tablet Take 1 tablet (0.6 mg total)  by mouth 2 (two) times daily.  30 tablet  0  . Cyanocobalamin (B-12 PO) Take 1 capsule by mouth daily.      . divalproex (DEPAKOTE) 250 MG DR tablet Take 250 mg by mouth 2 (two) times daily.      . furosemide (LASIX) 20 MG tablet Take 20 mg by mouth daily.      Marland Kitchen gabapentin (NEURONTIN) 300 MG capsule Take 300 mg by mouth 3 (three) times daily.      . hydrochlorothiazide (HYDRODIURIL) 25 MG tablet Take 25 mg by mouth at bedtime.       Marland Kitchen HYDROcodone-acetaminophen (NORCO/VICODIN) 5-325 MG per tablet Take 1 tablet by mouth every 6 (six) hours as needed for pain.      Marland Kitchen omeprazole (PRILOSEC) 20 MG capsule Take 20 mg by mouth daily.      . pregabalin (LYRICA) 75 MG capsule Take 75 mg by mouth 2 (two) times daily.      . Thiamine HCl (B-1 PO) Take 1 capsule by mouth daily.      . traMADol (ULTRAM) 50 MG tablet Take 50 mg by mouth every 8 (eight) hours as needed for pain.       Marland Kitchen zolpidem (AMBIEN) 10 MG tablet Take 10 mg by mouth at bedtime.       No current facility-administered medications for this visit.    REVIEW OF SYSTEMS:   Constitutional: Denies fevers, chills or abnormal night sweats Eyes: Denies blurriness of vision, double vision or watery eyes Ears, nose, mouth, throat, and face: Denies mucositis or sore throat Cardiovascular: Denies palpitation, chest discomfort or lower extremity swelling Gastrointestinal:  Denies nausea, heartburn or change in bowel habits Skin: Denies abnormal skin rashes Lymphatics: Denies new lymphadenopathy or easy bruising Neurological:Denies numbness, tingling or new weaknesses Behavioral/Psych: Mood is stable, no new changes  All other systems were reviewed with the patient and are negative.  PHYSICAL EXAMINATION: ECOG PERFORMANCE STATUS: 2 - Symptomatic, <50% confined to bed  Filed Vitals:   12/07/13 1011  BP: 110/73  Pulse: 62  Temp: 98.2 F (36.8 C)  Resp: 20   Filed Weights   12/07/13 1011  Weight: 155 lb 1.6 oz (70.353 kg)   The patient  is here accompanied by her father. She looks as old as her father. GENERAL:alert, no distress and comfortable. She looks thin but not cachectic SKIN: skin color, texture, turgor are normal, no rashes or significant lesions EYES: normal, conjunctiva are pale and non-injected, sclera clear OROPHARYNX:no exudate, no erythema and lips, buccal mucosa, and tongue normal . Poor dentition.  NECK: supple, thyroid normal size, non-tender, without nodularity LYMPH:  no palpable lymphadenopathy in the cervical, axillary or inguinal LUNGS: clear  to auscultation and percussion with normal breathing effort HEART: regular rate & rhythm and no murmurs and no lower extremity edema ABDOMEN:abdomen soft, non-tender and normal bowel sounds. No splenomegaly Musculoskeletal:no cyanosis of digits and no clubbing  PSYCH: alert & oriented x 3 with fluent speech NEURO: no focal motor/sensory deficits  LABORATORY DATA:  I have reviewed the data as listed Lab Results  Component Value Date   WBC 4.0 12/07/2013   HGB 11.5* 12/07/2013   HCT 35.3 12/07/2013   MCV 91.6 12/07/2013   PLT 80 repeated and verified* 12/07/2013    Recent Labs  12/22/12 1730 01/05/13 2327 11/27/13 1605 12/07/13 1047  NA 135 134* 135* 142  K 3.7 3.7 2.9* 3.6  CL 106 104 97  --   CO2 23 23 31 28   GLUCOSE 185* 102* 103* 90  BUN 16 12 15  15.7  CREATININE 0.91 1.06 1.22* 1.1  CALCIUM 9.9 10.0 8.2* 9.3  GFRNONAA 69* 57* 48*  --   GFRAA 80* 66* 55*  --   PROT 7.3 7.4 6.5 7.2  ALBUMIN 2.8* 3.2* 2.3* 2.8*  AST 43* 46* 83* 37*  ALT 27 31 25 23   ALKPHOS 153* 159* 131* 138  BILITOT 0.5 0.9 0.5 0.58   ASSESSMENT & PLAN:  #1 severe pancytopenia #2 significant IV drug use with cocaine #3 malnutrition with significant abnormal liver function tests The most likely cause of her pancytopenia is related to IV drug use. There are numerous report about drug-induced neutropenia and agranulocytosis associated with consumption of cocaine due to  cutting agent used. I suspect this is most likely the cause of her leukopenia. I will order an additional workup. The anemia is likely due to anemia of chronic disease The thrombocytopenia could be related to liver disease.  Review on her last ultrasound of the abdomen dated January 2014 showed an abnormal lesion seen. I will order repeat imaging study to exclude potential diagnosis of cancer in view of recent weight loss. #4 tobacco abuse Nicotine cessation counseling was provided. The patient appears motivated to quit smoking.  Orders Placed This Encounter  Procedures  . Sedimentation rate    Standing Status: Future     Number of Occurrences: 1     Standing Expiration Date: 12/07/2014  . Smear    Standing Status: Future     Number of Occurrences: 1     Standing Expiration Date: 12/07/2014  . CBC & Diff and Retic    Standing Status: Future     Number of Occurrences: 1     Standing Expiration Date: 12/07/2014  . ANA    Standing Status: Future     Number of Occurrences: 1     Standing Expiration Date: 12/07/2014  . Comprehensive metabolic panel    Standing Status: Future     Number of Occurrences: 1     Standing Expiration Date: 12/07/2014    All questions were answered. The patient knows to call the clinic with any problems, questions or concerns. I spent 40 minutes counseling the patient face to face. The total time spent in the appointment was 55 minutes and more than 50% was on counseling.     Heath Lark, MD 12/07/2013 4:34 PM

## 2013-12-07 NOTE — Progress Notes (Signed)
Checked in new patient with no issues at this time. She has not seen the dr. She has appt card.

## 2013-12-08 LAB — ANA: ANA: NEGATIVE

## 2013-12-08 LAB — SEDIMENTATION RATE: Sed Rate: 17 mm/hr (ref 0–22)

## 2013-12-15 ENCOUNTER — Ambulatory Visit (HOSPITAL_COMMUNITY)
Admission: RE | Admit: 2013-12-15 | Discharge: 2013-12-15 | Disposition: A | Discharge status: Home / Self Care | Payer: Medicare Other | Source: Ambulatory Visit | Attending: Hematology and Oncology | Admitting: Hematology and Oncology

## 2013-12-15 ENCOUNTER — Other Ambulatory Visit: Payer: Self-pay | Admitting: Hematology and Oncology

## 2013-12-15 DIAGNOSIS — R7989 Other specified abnormal findings of blood chemistry: Secondary | ICD-10-CM

## 2013-12-15 DIAGNOSIS — D61818 Other pancytopenia: Secondary | ICD-10-CM

## 2013-12-15 DIAGNOSIS — R634 Abnormal weight loss: Secondary | ICD-10-CM

## 2013-12-15 DIAGNOSIS — K7689 Other specified diseases of liver: Secondary | ICD-10-CM | POA: Insufficient documentation

## 2013-12-15 DIAGNOSIS — R945 Abnormal results of liver function studies: Secondary | ICD-10-CM

## 2013-12-15 MED ORDER — GADOBENATE DIMEGLUMINE 529 MG/ML IV SOLN
14.0000 mL | Freq: Once | INTRAVENOUS | Status: AC | PRN
  Administered 2013-12-15: 14 mL via INTRAVENOUS

## 2013-12-16 ENCOUNTER — Ambulatory Visit (HOSPITAL_BASED_OUTPATIENT_CLINIC_OR_DEPARTMENT_OTHER): Payer: Medicare Other

## 2013-12-16 ENCOUNTER — Telehealth: Payer: Self-pay | Admitting: Hematology and Oncology

## 2013-12-16 ENCOUNTER — Ambulatory Visit (HOSPITAL_BASED_OUTPATIENT_CLINIC_OR_DEPARTMENT_OTHER): Payer: Medicare Other | Admitting: Hematology and Oncology

## 2013-12-16 VITALS — BP 115/74 | HR 72 | Temp 97.8°F | Resp 18 | Ht 63.0 in | Wt 156.9 lb

## 2013-12-16 DIAGNOSIS — K769 Liver disease, unspecified: Secondary | ICD-10-CM

## 2013-12-16 DIAGNOSIS — D72829 Elevated white blood cell count, unspecified: Secondary | ICD-10-CM

## 2013-12-16 DIAGNOSIS — C787 Secondary malignant neoplasm of liver and intrahepatic bile duct: Secondary | ICD-10-CM

## 2013-12-16 DIAGNOSIS — C229 Malignant neoplasm of liver, not specified as primary or secondary: Secondary | ICD-10-CM

## 2013-12-16 DIAGNOSIS — F1910 Other psychoactive substance abuse, uncomplicated: Secondary | ICD-10-CM

## 2013-12-16 DIAGNOSIS — C220 Liver cell carcinoma: Secondary | ICD-10-CM

## 2013-12-16 LAB — CBC WITH DIFFERENTIAL/PLATELET
BASO%: 0.5 % (ref 0.0–2.0)
Basophils Absolute: 0 10*3/uL (ref 0.0–0.1)
EOS ABS: 0.1 10*3/uL (ref 0.0–0.5)
EOS%: 2 % (ref 0.0–7.0)
HCT: 34.9 % (ref 34.8–46.6)
HGB: 11.4 g/dL — ABNORMAL LOW (ref 11.6–15.9)
LYMPH%: 29.1 % (ref 14.0–49.7)
MCH: 29.5 pg (ref 25.1–34.0)
MCHC: 32.7 g/dL (ref 31.5–36.0)
MCV: 90.5 fL (ref 79.5–101.0)
MONO#: 1 10*3/uL — ABNORMAL HIGH (ref 0.1–0.9)
MONO%: 21.7 % — AB (ref 0.0–14.0)
NEUT#: 2.1 10*3/uL (ref 1.5–6.5)
NEUT%: 46.7 % (ref 38.4–76.8)
Platelets: 79 10*3/uL — ABNORMAL LOW (ref 145–400)
RBC: 3.86 10*6/uL (ref 3.70–5.45)
RDW: 16.7 % — AB (ref 11.2–14.5)
WBC: 4.5 10*3/uL (ref 3.9–10.3)
lymph#: 1.3 10*3/uL (ref 0.9–3.3)

## 2013-12-16 NOTE — Telephone Encounter (Signed)
sent pt to lab and gv barium

## 2013-12-17 LAB — AFP TUMOR MARKER: AFP TUMOR MARKER: 870.6 ng/mL — AB (ref 0.0–8.0)

## 2013-12-18 ENCOUNTER — Encounter: Payer: Self-pay | Admitting: Hematology and Oncology

## 2013-12-18 DIAGNOSIS — C220 Liver cell carcinoma: Secondary | ICD-10-CM | POA: Insufficient documentation

## 2013-12-18 NOTE — Assessment & Plan Note (Signed)
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 I encouraged patient to say abstinent.

## 2013-12-18 NOTE — Progress Notes (Signed)
[facility] OFFICE PROGRESS NOTE  Patient Care Team: Elwyn Reach, MD as PCP - General (Internal Medicine)  SUMMARY OF ONCOLOGIC HISTORY: Oncology History   Cancer, hepatocellular   Primary site: Liver (Excluding Intrahepatic Bile Ducts)   Staging method: AJCC 7th Edition   Clinical: Stage IIIA (T3a, N0, M0) signed by Heath Lark, MD on 12/18/2013  1:59 PM   Summary: Stage IIIA (T3a, N0, M0)       Cancer, hepatocellular   08/01/2012 Imaging Abdomen US showed  a 2.6 cm liver lesion on the left.   12/16/2013 Tumor Marker AFP is 870.6   12/18/2013 Initial Diagnosis Cancer, hepatocellular    INTERVAL HISTORY: Please see below for problem oriented charting. She returns today to followup on abnormal imaging and severe pancytopenia related to drug use. She is attempting to quit smoking. She has persistent easy skin bruising.  REVIEW OF SYSTEMS:   Constitutional: Denies fevers, chills  Eyes: Denies blurriness of vision Ears, nose, mouth, throat, and face: Denies mucositis or sore throat Respiratory: Denies cough, dyspnea or wheezes Cardiovascular: Denies palpitation, chest discomfort or lower extremity swelling Gastrointestinal:  Denies nausea, heartburn or change in bowel habits Skin: Denies abnormal skin rashes Lymphatics: Denies new lymphadenopathy Neurological:Denies numbness, tingling or new weaknesses Behavioral/Psych: Mood is stable, no new changes  All other systems were reviewed with the patient and are negative.  I have reviewed the past medical history, past surgical history, social history and family history with the patient and they are unchanged from previous note.  ALLERGIES:  has No Known Allergies.  MEDICATIONS:  Current Outpatient Prescriptions  Medication Sig Dispense Refill  . budesonide-formoterol (SYMBICORT) 160-4.5 MCG/ACT inhaler Inhale 2 puffs into the lungs 2 (two) times daily.  1 Inhaler  3  . clonazePAM (KLONOPIN) 1 MG tablet Take 1  mg by mouth 4 (four) times daily as needed for anxiety.       . colchicine (COLCRYS) 0.6 MG tablet Take 1 tablet (0.6 mg total) by mouth 2 (two) times daily.  30 tablet  0  . Cyanocobalamin (B-12 PO) Take 1 capsule by mouth daily.      . divalproex (DEPAKOTE) 250 MG DR tablet Take 250 mg by mouth 2 (two) times daily.      . furosemide (LASIX) 20 MG tablet Take 20 mg by mouth daily.      Marland Kitchen gabapentin (NEURONTIN) 300 MG capsule Take 300 mg by mouth 3 (three) times daily.      . hydrochlorothiazide (HYDRODIURIL) 25 MG tablet Take 25 mg by mouth at bedtime.       Marland Kitchen HYDROcodone-acetaminophen (NORCO/VICODIN) 5-325 MG per tablet Take 1 tablet by mouth every 6 (six) hours as needed for pain.      Marland Kitchen omeprazole (PRILOSEC) 20 MG capsule Take 20 mg by mouth daily.      . pregabalin (LYRICA) 75 MG capsule Take 75 mg by mouth 2 (two) times daily.      . Thiamine HCl (B-1 PO) Take 1 capsule by mouth daily.      . traMADol (ULTRAM) 50 MG tablet Take 50 mg by mouth every 8 (eight) hours as needed for pain.       Marland Kitchen zolpidem (AMBIEN) 10 MG tablet Take 10 mg by mouth at bedtime.       No current facility-administered medications for this visit.    PHYSICAL EXAMINATION: ECOG PERFORMANCE STATUS: 1 - Symptomatic but completely ambulatory  Filed Vitals:   12/16/13 1114  BP: 115/74  Pulse: 72  Temp: 97.8 F (36.6 C)  Resp: 18   Filed Weights   12/16/13 1114  Weight: 156 lb 14.4 oz (71.169 kg)    GENERAL:alert, no distress and comfortable. She looks thin but not cachectic SKIN: skin color, texture, turgor are normal, no rashes or significant lesions. Noted some skin bruising. Musculoskeletal:no cyanosis of digits and no clubbing  NEURO: alert & oriented x 3 with fluent speech, no focal motor/sensory deficits  LABORATORY DATA:  I have reviewed the data as listed    Component Value Date/Time   NA 142 12/07/2013 1047   NA 135* 11/27/2013 1605   K 3.6 12/07/2013 1047   K 2.9* 11/27/2013 1605   CL 97  11/27/2013 1605   CO2 28 12/07/2013 1047   CO2 31 11/27/2013 1605   GLUCOSE 90 12/07/2013 1047   GLUCOSE 103* 11/27/2013 1605   BUN 15.7 12/07/2013 1047   BUN 15 11/27/2013 1605   CREATININE 1.1 12/07/2013 1047   CREATININE 1.22* 11/27/2013 1605   CALCIUM 9.3 12/07/2013 1047   CALCIUM 8.2* 11/27/2013 1605   PROT 7.2 12/07/2013 1047   PROT 6.5 11/27/2013 1605   ALBUMIN 2.8* 12/07/2013 1047   ALBUMIN 2.3* 11/27/2013 1605   AST 37* 12/07/2013 1047   AST 83* 11/27/2013 1605   ALT 23 12/07/2013 1047   ALT 25 11/27/2013 1605   ALKPHOS 138 12/07/2013 1047   ALKPHOS 131* 11/27/2013 1605   BILITOT 0.58 12/07/2013 1047   BILITOT 0.5 11/27/2013 1605   GFRNONAA 48* 11/27/2013 1605   GFRAA 55* 11/27/2013 1605    No results found for this basename: SPEP, UPEP,  kappa and lambda light chains    Lab Results  Component Value Date   WBC 4.5 12/16/2013   NEUTROABS 2.1 12/16/2013   HGB 11.4* 12/16/2013   HCT 34.9 12/16/2013   MCV 90.5 12/16/2013   PLT 79* 12/16/2013      Chemistry      Component Value Date/Time   NA 142 12/07/2013 1047   NA 135* 11/27/2013 1605   K 3.6 12/07/2013 1047   K 2.9* 11/27/2013 1605   CL 97 11/27/2013 1605   CO2 28 12/07/2013 1047   CO2 31 11/27/2013 1605   BUN 15.7 12/07/2013 1047   BUN 15 11/27/2013 1605   CREATININE 1.1 12/07/2013 1047   CREATININE 1.22* 11/27/2013 1605      Component Value Date/Time   CALCIUM 9.3 12/07/2013 1047   CALCIUM 8.2* 11/27/2013 1605   ALKPHOS 138 12/07/2013 1047   ALKPHOS 131* 11/27/2013 1605   AST 37* 12/07/2013 1047   AST 83* 11/27/2013 1605   ALT 23 12/07/2013 1047   ALT 25 11/27/2013 1605   BILITOT 0.58 12/07/2013 1047   BILITOT 0.5 11/27/2013 1605     ASSESSMENT & PLAN:  #1 newly diagnosed hepatocellular carcinoma I recommend full staging by CT scan. I am referring her to a surgeon to determine resectability. #2 polysubstance abuse I continue to reinforce the importance of staying abstinence #3 pancytopenia Leukopenia and anemia are resolving. The  thrombocytopenia is also improving. Continue observation only.    Orders Placed This Encounter  Procedures  . CT Chest W Contrast    Standing Status: Future     Number of Occurrences:      Standing Expiration Date: 02/15/2015    Order Specific Question:  Reason for Exam (SYMPTOM  OR DIAGNOSIS REQUIRED)    Answer:  staging liver cancer    Order Specific Question:  Is  the patient pregnant?    Answer:  No    Order Specific Question:  Preferred imaging location?    Answer:  [facility]  . CT Abdomen Pelvis W Contrast    Standing Status: Future     Number of Occurrences:      Standing Expiration Date: 03/18/2015    Order Specific Question:  Reason for Exam (SYMPTOM  OR DIAGNOSIS REQUIRED)    Answer:  staging liver cancer    Order Specific Question:  Is the patient pregnant?    Answer:  No    Order Specific Question:  Preferred imaging location?    Answer:  [facility]  . AFP tumor marker    Standing Status: Future     Number of Occurrences: 1     Standing Expiration Date: 12/16/2014  . CBC with Differential    Standing Status: Future     Number of Occurrences: 1     Standing Expiration Date: 12/16/2014  . Ambulatory referral to General Surgery    Referral Priority:  Urgent    Referral Type:  Surgical    Referral Reason:  Specialty Services Required    Referred to Provider:  Pedro Earls, MD    Requested Specialty:  General Surgery    Number of Visits Requested:  1  . Ambulatory referral to Gastroenterology    Referral Priority:  Routine    Referral Type:  Consultation    Referral Reason:  Specialty Services Required    Requested Specialty:  Gastroenterology    Number of Visits Requested:  1   All questions were answered. The patient knows to call the clinic with any problems, questions or concerns. No barriers to learning was detected.   Heath Lark, MD 12/18/2013 2:02 PM

## 2013-12-21 ENCOUNTER — Telehealth: Payer: Self-pay | Admitting: Hematology and Oncology

## 2013-12-21 ENCOUNTER — Telehealth: Payer: Self-pay | Admitting: *Deleted

## 2013-12-21 NOTE — Telephone Encounter (Signed)
S/w Otila Kluver at [facility] regarding referral.  She will call back after they speak w/ Dr. Hassell Done this morning.

## 2013-12-21 NOTE — Telephone Encounter (Signed)
per 6/3 pof moved 6/8 f/u to 6/5. s/w pt she is aware. also confirmed 6/4 ct and appt @ [facility].

## 2013-12-21 NOTE — Telephone Encounter (Signed)
Dorothy Burns called back w/ appt for pt tomorrow.  She needs to arrive at 11:45 am,  [employer] information and medications.  Called pt and left her a VM to call nurse back for appt information.

## 2013-12-21 NOTE — Telephone Encounter (Signed)
Pt returned call and I informed her of appt w/ Dr. Hassell Done tomorrow at 11:45 am.  Pt states she knows where surgeon's office is, she has been there before.  Instructed to bring insurance information and medications.  Also informed pt her appt w/ Dr. Alvy Bimler will be moved to 6/05 at 3:30 pm.  Pt verbalized understanding.

## 2013-12-21 NOTE — Telephone Encounter (Signed)
Pt left Vm asking if ok to refrigerate her Contrast for CT tomorrow?  Called pt back and LVM informing ok to put in fridge and most people prefer to drink it cold.

## 2013-12-22 ENCOUNTER — Ambulatory Visit (HOSPITAL_COMMUNITY)
Admission: RE | Admit: 2013-12-22 | Discharge: 2013-12-22 | Disposition: A | Discharge status: Home / Self Care | Payer: Medicare Other | Source: Ambulatory Visit | Attending: Hematology and Oncology | Admitting: Hematology and Oncology

## 2013-12-22 ENCOUNTER — Encounter (HOSPITAL_COMMUNITY): Payer: Self-pay

## 2013-12-22 ENCOUNTER — Ambulatory Visit (INDEPENDENT_AMBULATORY_CARE_PROVIDER_SITE_OTHER): Payer: Medicare Other | Admitting: Surgery

## 2013-12-22 ENCOUNTER — Telehealth: Payer: Self-pay | Admitting: Hematology and Oncology

## 2013-12-22 ENCOUNTER — Encounter (INDEPENDENT_AMBULATORY_CARE_PROVIDER_SITE_OTHER): Payer: Self-pay | Admitting: Surgery

## 2013-12-22 VITALS — BP 128/80 | HR 49 | Temp 98.3°F | Ht 65.0 in | Wt 156.0 lb

## 2013-12-22 DIAGNOSIS — J9819 Other pulmonary collapse: Secondary | ICD-10-CM | POA: Insufficient documentation

## 2013-12-22 DIAGNOSIS — C787 Secondary malignant neoplasm of liver and intrahepatic bile duct: Secondary | ICD-10-CM

## 2013-12-22 DIAGNOSIS — R599 Enlarged lymph nodes, unspecified: Secondary | ICD-10-CM | POA: Insufficient documentation

## 2013-12-22 DIAGNOSIS — K769 Liver disease, unspecified: Secondary | ICD-10-CM

## 2013-12-22 DIAGNOSIS — I700 Atherosclerosis of aorta: Secondary | ICD-10-CM | POA: Insufficient documentation

## 2013-12-22 DIAGNOSIS — R161 Splenomegaly, not elsewhere classified: Secondary | ICD-10-CM | POA: Insufficient documentation

## 2013-12-22 DIAGNOSIS — C220 Liver cell carcinoma: Secondary | ICD-10-CM

## 2013-12-22 DIAGNOSIS — C229 Malignant neoplasm of liver, not specified as primary or secondary: Secondary | ICD-10-CM

## 2013-12-22 DIAGNOSIS — C228 Malignant neoplasm of liver, primary, unspecified as to type: Secondary | ICD-10-CM | POA: Insufficient documentation

## 2013-12-22 DIAGNOSIS — K7689 Other specified diseases of liver: Secondary | ICD-10-CM | POA: Insufficient documentation

## 2013-12-22 DIAGNOSIS — K7460 Unspecified cirrhosis of liver: Secondary | ICD-10-CM | POA: Insufficient documentation

## 2013-12-22 DIAGNOSIS — I8500 Esophageal varices without bleeding: Secondary | ICD-10-CM | POA: Insufficient documentation

## 2013-12-22 DIAGNOSIS — R188 Other ascites: Secondary | ICD-10-CM | POA: Insufficient documentation

## 2013-12-22 MED ORDER — IOHEXOL 300 MG/ML  SOLN
100.0000 mL | Freq: Once | INTRAMUSCULAR | Status: AC | PRN
  Administered 2013-12-22: 100 mL via INTRAVENOUS

## 2013-12-22 NOTE — Patient Instructions (Signed)
Follow up with Dr. Alvy Bimler

## 2013-12-22 NOTE — Progress Notes (Signed)
Referral for [facility]: CT report : 3.7 cm enhancing lesion in the anterior segment right hepatic dome  and 4.9 cm enhancing lesion in the lateral segment left hepatic  lobe, both of which are compatible with Flushing on MRI.  Mild periportal and portacaval lymphadenopathy, indeterminate.  No evidence of distant metastases in the chest, abdomen, or pelvis.  Cirrhosis with stigmata of portal hypertension, including  splenomegaly and small gastroesophageal varices. 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    Dorothy Burns 58 y.o.  Body mass index is 25.96 kg/(m^2).  Patient Active Problem List   Diagnosis Date Noted  . Cancer, hepatocellular 12/18/2013  . Substance abuse 01/06/2013  . Postmenopausal bleeding 05/21/2011  . Bronchitis 05/21/2011  . UNSPECIFIED VAGINITIS AND VULVOVAGINITIS 07/29/2010  . BACK PAIN, LUMBAR 07/17/2010  . DYSURIA 07/17/2010  . SUBSTANCE ABUSE, MULTIPLE 06/08/2009  . PARESTHESIA 06/08/2009  . OBESITY, UNSPECIFIED 01/26/2009  . MENORRHAGIA 01/26/2009  . CERVICAL RADICULOPATHY, LEFT 09/29/2007  . TOBACCO USE 06/11/2007  . HYPERTENSION, BENIGN ESSENTIAL 05/11/2007  . COPD 05/11/2007  . 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 Tubal ligation    . Facial reconstruction surgery    . Multiple tooth extractions    . Hysteroscopy w/d&c  09/29/2011    Procedure: DILATATION AND CURETTAGE /HYSTEROSCOPY;  Surgeon: Mora Bellman, MD;  Location: [facility];  Service: Gynecology;  Laterality: N/A;  . Cholecystectomy  08/20/2012    Procedure: LAPAROSCOPIC CHOLECYSTECTOMY;  Surgeon: Ralene Ok, MD;  Location: [facility];  Service: San Jetty, MD No diagnosis found.  I reviewed her CT scan and these lesions are in the dome of the right lobe and the left lateral segment. She has an underlying pattern of significant hepatic cirrhosis  with portal hypertension and a resection would require a left lateral segmentectomy and right lobectomy. I doubtful that she could survive on a left medial segment with underlying cirrhosis. I will discuss this with Dr. Barry Dienes but I think at this point she is surgically unresectable and should be managed with medical oncology guidance.  Matt B. Hassell Done, MD, [facility] [phone] beeper [phone]  12/22/2013 1:03 PM

## 2013-12-23 ENCOUNTER — Telehealth: Payer: Self-pay | Admitting: Hematology and Oncology

## 2013-12-23 ENCOUNTER — Ambulatory Visit (HOSPITAL_BASED_OUTPATIENT_CLINIC_OR_DEPARTMENT_OTHER): Payer: Medicare Other | Admitting: Hematology and Oncology

## 2013-12-23 ENCOUNTER — Encounter: Payer: Self-pay | Admitting: Hematology and Oncology

## 2013-12-23 ENCOUNTER — Encounter: Payer: Self-pay | Admitting: Gastroenterology

## 2013-12-23 VITALS — BP 116/74 | HR 74 | Temp 97.8°F | Resp 18 | Ht 65.0 in | Wt 154.9 lb

## 2013-12-23 DIAGNOSIS — D638 Anemia in other chronic diseases classified elsewhere: Secondary | ICD-10-CM

## 2013-12-23 DIAGNOSIS — K766 Portal hypertension: Secondary | ICD-10-CM

## 2013-12-23 DIAGNOSIS — D696 Thrombocytopenia, unspecified: Secondary | ICD-10-CM | POA: Insufficient documentation

## 2013-12-23 DIAGNOSIS — K7460 Unspecified cirrhosis of liver: Secondary | ICD-10-CM | POA: Insufficient documentation

## 2013-12-23 DIAGNOSIS — R109 Unspecified abdominal pain: Secondary | ICD-10-CM

## 2013-12-23 DIAGNOSIS — R161 Splenomegaly, not elsewhere classified: Secondary | ICD-10-CM | POA: Insufficient documentation

## 2013-12-23 DIAGNOSIS — C220 Liver cell carcinoma: Secondary | ICD-10-CM

## 2013-12-23 DIAGNOSIS — F17200 Nicotine dependence, unspecified, uncomplicated: Secondary | ICD-10-CM

## 2013-12-23 DIAGNOSIS — C228 Malignant neoplasm of liver, primary, unspecified as to type: Secondary | ICD-10-CM

## 2013-12-23 MED ORDER — MORPHINE SULFATE 15 MG PO TABS: 15.0000 mg | ORAL_TABLET | Freq: Three times a day (TID) | ORAL | Status: DC | PRN

## 2013-12-23 NOTE — Assessment & Plan Note (Signed)
This is due to liver cirrhosis. Recommend observation only.

## 2013-12-23 NOTE — Assessment & Plan Note (Signed)
The patient is not a candidate for surgical resection. The disease appeared to be localized in the liver. I recommend referral to GI/interventional radiologist for liver ablation/chemoembolization. If the patient is not a candidate for the local treatment, I recommend chemotherapy with sorafenib. I discussed with the patient the risk and benefit and side effects of sorafenib and she agreed for the consult with interventional radiologists first before consideration for palliative chemotherapy. We discussed prognosis. Without treatment, her survival is measured in less than 6 months. However, with treatment, she may live more than a year. All forms of treatment are palliative in nature and her disease is not curable.

## 2013-12-23 NOTE — Assessment & Plan Note (Signed)
I spent some time counseling the patient the importance of tobacco cessation. she is currently attempting to quit on her own 

## 2013-12-23 NOTE — Assessment & Plan Note (Signed)
This is due to liver cirrhosis and splenomegaly. Recommend observation only.

## 2013-12-23 NOTE — Assessment & Plan Note (Signed)
I discontinue Vicodin. I prescribed morphine sulfate as needed for pain. We discussed about the role of pain management in the oncology clinic. The patient agreed to be compliant with prescribed pain regimen and promised not to share the prescribed medications. Any lost medications or missed prescription will not be refilled sooner than anticipated time when the patient's prescription is expected to run out. We also discussed narcotics refill policy in the clinic.  The patient is educated to check the pill bottles in the middle of the week and ensure there is adequate supply to last through the weekend until next appointment or available business day.  The oncology service has a strict policy not to refill pain medications after business hours or the weekend.  If the patient is found to have violated the verbal agreement as stated no further pain medications will be prescribed in the future.

## 2013-12-23 NOTE — Patient Instructions (Signed)
Sorafenib Oral Tablet  What is this medicine?  SORAFENIB (soe RAF e nib) is a chemotherapy drug. It targets specific proteins within cancer cells and stops the cancer cell from growing. This medicine is used to treat liver cancer and kidney cancer.  This medicine may be used for other purposes; ask your health care provider or pharmacist if you have questions.  COMMON BRAND NAME(S): Nexavar  What should I tell my health care provider before I take this medicine?  They need to know if you have any of these conditions:  -bleeding problems  -heart disease  -high blood pressure  -kidney disease  -liver disease  -lung cancer  -recent surgery  -an unusual or allergic reaction to sorafenib, other medicines, foods, dyes, or preservatives  -pregnant or trying to get pregnant  -breast-feeding  How should I use this medicine?  Take this medicine by mouth with a glass of water. Follow the directions on the prescription label. Do not cut, crush or chew this medicine. Take this medicine on an empty stomach, at least 1 hour before or 2 hours after meals. Do not take with food. Take your medicine at regular intervals. Do not take it more often than directed. Do not stop taking except on your doctor's advice.  Talk to your pediatrician regarding the use of this medicine in children. Special care may be needed.  Overdosage: If you think you have taken too much of this medicine contact a poison control center or emergency room at once.  NOTE: This medicine is only for you. Do not share this medicine with others.  What if I miss a dose?  If you miss a dose, take it as soon as you can. If it is almost time for your next dose, take only that dose. Do not take double or extra doses.  What may interact with this medicine?  This medicine may interact with the following medications:  -carbamazepine  -dexamethasone  -medicines for seizures like carbamazepine, phenobarbital, phenytoin  -neomycin  -rifabutin  -rifampin  -St. John's  Wort  -warfarin  This list may not describe all possible interactions. Give your health care provider a list of all the medicines, herbs, non-prescription drugs, or dietary supplements you use. Also tell them if you smoke, drink alcohol, or use illegal drugs. Some items may interact with your medicine.  What should I watch for while using this medicine?  This drug may make you feel generally unwell. This is not uncommon, as chemotherapy can affect healthy cells as well as cancer cells. Report any side effects. Continue your course of treatment even though you feel ill unless your doctor tells you to stop.  Men and women should use effective birth control while taking this medicine and for 2 weeks after stopping this medicine. Do not become pregnant while taking this medicine. Women should inform their doctor if they wish to become pregnant or think they might be pregnant. There is a potential for serious side effects to an unborn child. Talk to your health care professional or pharmacist for more information. Do not breast-feed an infant while taking this medicine.  If you are going to have surgery or any other procedures, tell your doctor you are taking this medicine.  What side effects may I notice from receiving this medicine?  Side effects that you should report to your doctor or health care professional as soon as possible:  -allergic reactions like skin rash, itching or hives, swelling of the face, lips, or tongue  -  black, tarry stools  -breathing problems  -chest pain or chest tightness  -dark urine  -dizziness  -fast or irregular heartbeat  -feeling faint or lightheaded  -high fever  -light-colored stools  -nausea, vomiting  -redness, blistering, peeling or loosening of the skin, including inside the mouth  -right upper belly pain  -sores on the hands or feet  -spitting up blood or brown material that looks like coffee grounds  -stomach pain  -yellowing of the eyes or skinSide effects that usually do not  require medical attention (report to your doctor or health care professional if they continue or are bothersome):  -diarrhea  -hair loss  -loss of appetite  -tiredness  -weight loss  This list may not describe all possible side effects. Call your doctor for medical advice about side effects. You may report side effects to FDA at 1-800-FDA-1088.  Where should I keep my medicine?  Keep out of the reach of children.  Store at room temperature between 15 and 30 degrees C (59 and 86 degrees F). Protect from moisture. Throw away any unused medicine after the expiration date.  NOTE: This sheet is a summary. It may not cover all possible information. If you have questions about this medicine, talk to your doctor, pharmacist, or health care provider.   2014, [employer]vier/[employer] Standard. (2011-05-23 14:11:09)

## 2013-12-23 NOTE — Telephone Encounter (Signed)
gv adn printed appt sched and avs for pt for June and Sept....pt sched to see Dr. Fuller Plan on 6.11 @ 9:30am

## 2013-12-23 NOTE — Assessment & Plan Note (Signed)
She has significant portal hypertension. I recommend the patient to abstain from alcoholism. I also recommend discontinuation of Vicodin/Norc and to restrict Tylenol intake.

## 2013-12-23 NOTE — Progress Notes (Signed)
[place] OFFICE PROGRESS NOTE  Patient Care Team: Elwyn Reach, MD as PCP - General (Internal Medicine)  SUMMARY OF ONCOLOGIC HISTORY: Oncology History   Cancer, hepatocellular, not biopsy proven    Primary site: Liver (Excluding Intrahepatic Bile Ducts)   Staging method: AJCC 7th Edition   Clinical: Stage IIIA (T3a, N0, M0) signed by Heath Lark, MD on 12/18/2013  1:59 PM   Summary: Stage IIIA (T3a, N0, M0)       Cancer, hepatocellular   08/01/2012 Imaging Abdomen US showed  a 2.6 cm liver lesion on the left.   12/15/2013 Imaging MRI liver showed 5.6 cm enhancing lesion in the lateral segment left hepatic lobe, increased from prior ultrasound. 4.8 cm enhancing lesion in the anterior segment right hepatic dome, not definitely visualized on prior ultrasound. There is liver cirrhosis    12/16/2013 Tumor Marker AFP is 870.6   12/22/2013 Imaging CT scan of the chest and abdomen confirmed two liver lesions but no distant metastatic disease.    INTERVAL HISTORY: Please see below for problem oriented charting. She complained of severe abdominal pain. Denies recent bleeding.  REVIEW OF SYSTEMS:   Constitutional: Denies fevers, chills or abnormal weight loss Eyes: Denies blurriness of vision Ears, nose, mouth, throat, and face: Denies mucositis or sore throat Respiratory: Denies cough, dyspnea or wheezes Cardiovascular: Denies palpitation, chest discomfort or lower extremity swelling Gastrointestinal:  Denies nausea, heartburn or change in bowel habits Skin: Denies abnormal skin rashes Lymphatics: Denies new lymphadenopathy  Neurological:Denies numbness, tingling or new weaknesses Behavioral/Psych: Mood is stable, no new changes  All other systems were reviewed with the patient and are negative.  I have reviewed the past medical history, past surgical history, social history and family history with the patient and they are unchanged from previous note.  ALLERGIES:   has No Known Allergies.  MEDICATIONS:  Current Outpatient Prescriptions  Medication Sig Dispense Refill  . budesonide-formoterol (SYMBICORT) 160-4.5 MCG/ACT inhaler Inhale 2 puffs into the lungs 2 (two) times daily.  1 Inhaler  3  . clonazePAM (KLONOPIN) 1 MG tablet Take 1 mg by mouth 4 (four) times daily as needed for anxiety.       . colchicine (COLCRYS) 0.6 MG tablet Take 1 tablet (0.6 mg total) by mouth 2 (two) times daily.  30 tablet  0  . Cyanocobalamin (B-12 PO) Take 1 capsule by mouth daily.      . divalproex (DEPAKOTE) 250 MG DR tablet Take 250 mg by mouth 2 (two) times daily.      . furosemide (LASIX) 20 MG tablet Take 20 mg by mouth daily.      Marland Kitchen gabapentin (NEURONTIN) 300 MG capsule Take 300 mg by mouth 3 (three) times daily.      . hydrochlorothiazide (HYDRODIURIL) 25 MG tablet Take 25 mg by mouth at bedtime.       Marland Kitchen HYDROcodone-acetaminophen (NORCO/VICODIN) 5-325 MG per tablet Take 1 tablet by mouth every 6 (six) hours as needed for pain.      Marland Kitchen omeprazole (PRILOSEC) 20 MG capsule Take 20 mg by mouth daily.      . pregabalin (LYRICA) 75 MG capsule Take 75 mg by mouth 2 (two) times daily.      . Thiamine HCl (B-1 PO) Take 1 capsule by mouth daily.      . traMADol (ULTRAM) 50 MG tablet Take 50 mg by mouth every 8 (eight) hours as needed for pain.       Marland Kitchen zolpidem (AMBIEN)  10 MG tablet Take 10 mg by mouth at bedtime.      Marland Kitchen morphine (MSIR) 15 MG tablet Take 1 tablet (15 mg total) by mouth every 8 (eight) hours as needed for severe pain.  90 tablet  0   No current facility-administered medications for this visit.    PHYSICAL EXAMINATION: ECOG PERFORMANCE STATUS: 1 - Symptomatic but completely ambulatory  Filed Vitals:   12/23/13 1528  BP: 116/74  Pulse: 74  Temp: 97.8 F (36.6 C)  Resp: 18   Filed Weights   12/23/13 1528  Weight: 154 lb 14.4 oz (70.262 kg)    GENERAL:alert, no distress and comfortable. She looks thin. She looks older than stated age. SKIN: skin  color, texture, turgor are normal, no rashes or significant lesions EYES: normal, Conjunctiva are pink and non-injected, sclera clear OROPHARYNX:no exudate, no erythema and lips, buccal mucosa, and tongue normal. Poor dentition is noted.  Musculoskeletal:no cyanosis of digits and no clubbing  NEURO: alert & oriented x 3 with fluent speech, no focal motor/sensory deficits  LABORATORY DATA:  I have reviewed the data as listed    Component Value Date/Time   NA 142 12/07/2013 1047   NA 135* 11/27/2013 1605   K 3.6 12/07/2013 1047   K 2.9* 11/27/2013 1605   CL 97 11/27/2013 1605   CO2 28 12/07/2013 1047   CO2 31 11/27/2013 1605   GLUCOSE 90 12/07/2013 1047   GLUCOSE 103* 11/27/2013 1605   BUN 15.7 12/07/2013 1047   BUN 15 11/27/2013 1605   CREATININE 1.1 12/07/2013 1047   CREATININE 1.22* 11/27/2013 1605   CALCIUM 9.3 12/07/2013 1047   CALCIUM 8.2* 11/27/2013 1605   PROT 7.2 12/07/2013 1047   PROT 6.5 11/27/2013 1605   ALBUMIN 2.8* 12/07/2013 1047   ALBUMIN 2.3* 11/27/2013 1605   AST 37* 12/07/2013 1047   AST 83* 11/27/2013 1605   ALT 23 12/07/2013 1047   ALT 25 11/27/2013 1605   ALKPHOS 138 12/07/2013 1047   ALKPHOS 131* 11/27/2013 1605   BILITOT 0.58 12/07/2013 1047   BILITOT 0.5 11/27/2013 1605   GFRNONAA 48* 11/27/2013 1605   GFRAA 55* 11/27/2013 1605    No results found for this basename: SPEP, UPEP,  kappa and lambda light chains    Lab Results  Component Value Date   WBC 4.5 12/16/2013   NEUTROABS 2.1 12/16/2013   HGB 11.4* 12/16/2013   HCT 34.9 12/16/2013   MCV 90.5 12/16/2013   PLT 79* 12/16/2013      Chemistry      Component Value Date/Time   NA 142 12/07/2013 1047   NA 135* 11/27/2013 1605   K 3.6 12/07/2013 1047   K 2.9* 11/27/2013 1605   CL 97 11/27/2013 1605   CO2 28 12/07/2013 1047   CO2 31 11/27/2013 1605   BUN 15.7 12/07/2013 1047   BUN 15 11/27/2013 1605   CREATININE 1.1 12/07/2013 1047   CREATININE 1.22* 11/27/2013 1605      Component Value Date/Time   CALCIUM 9.3 12/07/2013 1047    CALCIUM 8.2* 11/27/2013 1605   ALKPHOS 138 12/07/2013 1047   ALKPHOS 131* 11/27/2013 1605   AST 37* 12/07/2013 1047   AST 83* 11/27/2013 1605   ALT 23 12/07/2013 1047   ALT 25 11/27/2013 1605   BILITOT 0.58 12/07/2013 1047   BILITOT 0.5 11/27/2013 1605       RADIOGRAPHIC STUDIES: I reviewed the imaging study with the patient and family. I have personally reviewed the radiological  images as listed and agreed with the findings in the report. Ct Chest W Contrast  12/22/2013   CLINICAL DATA:  Hepatocellular carcinoma, for staging  EXAM: CT CHEST, ABDOMEN, AND PELVIS WITH CONTRAST  TECHNIQUE: Multidetector CT imaging of the chest, abdomen and pelvis was performed following the standard protocol during bolus administration of intravenous contrast.  CONTRAST:  189mL OMNIPAQUE IOHEXOL 300 MG/ML  SOLN  COMPARISON:  MRI abdomen dated 12/15/2013  FINDINGS: CT CHEST FINDINGS  Evaluation of the lung parenchyma is constrained by respiratory motion. Mild dependent atelectasis in the right lower lobe. No suspicious pulmonary nodules. No pleural effusion or pneumothorax.  Visualized thyroid is unremarkable.  The heart is normal in size. No pericardial effusion. Mild atherosclerotic calcifications of the aortic arch.  No suspicious mediastinal, hilar, or axillary lymphadenopathy.  Mild degenerative changes of the lower thoracic spine.  CT ABDOMEN AND PELVIS FINDINGS  Cirrhosis. 3.7 x 3.7 x 3.6 cm enhancing lesion in the anterior segment right hepatic dome (series 2/ image 16). 4.7 x 4.9 x 4.3 cm enhancing lesion in the lateral segment left hepatic lobe (series 2/ image 19). Both lesions are compatible with [place] on MRI.  Splenomegaly, measuring 16.0 cm in maximal craniocaudal dimension.  Small gastroesophageal varices. Recanalized paraumbilical vein. Portal vein is patient.  Periportal and portacaval lymphadenopathy, measuring up to 1.8 cm short axis (series 6/image 35), indeterminate. Small retroperitoneal nodes measuring up  to 8 mm short axis (series 6/image 72), which do not meet pathologic CT size criteria.  Trace pelvic ascites.  Status post cholecystectomy. No intrahepatic or extrahepatic ductal dilatation.  Pancreas and adrenal glands are within normal limits.  Kidney is within normal limits.  No hydronephrosis.  No evidence of bowel obstruction. Normal appendix. Mild colonic wall thickening with pericolonic stranding in the right mid abdomen (Series 4/image 141), favored to reflect incidental/secondary finding, less likely mild infectious/inflammatory colitis.  No evidence of abdominal aortic aneurysm. Circumaortic left renal vein.  Uterus and bilateral ovaries are unremarkable.  Bladder is within normal limits.  Visualized osseous structures are within normal limits.  IMPRESSION: 3.7 cm enhancing lesion in the anterior segment right hepatic dome and 4.9 cm enhancing lesion in the lateral segment left hepatic lobe, both of which are compatible with [place] on MRI.  Mild periportal and portacaval lymphadenopathy, indeterminate.  No evidence of distant metastases in the chest, abdomen, or pelvis.  Cirrhosis with stigmata of portal hypertension, including splenomegaly and small gastroesophageal varices. Portal vein is patent.   Electronically Signed   By: Julian Hy M.D.   On: 12/22/2013 09:11   Ct Abdomen Pelvis W Contrast  12/22/2013   CLINICAL DATA:  Hepatocellular carcinoma, for staging  EXAM: CT CHEST, ABDOMEN, AND PELVIS WITH CONTRAST  TECHNIQUE: Multidetector CT imaging of the chest, abdomen and pelvis was performed following the standard protocol during bolus administration of intravenous contrast.  CONTRAST:  159mL OMNIPAQUE IOHEXOL 300 MG/ML  SOLN  COMPARISON:  MRI abdomen dated 12/15/2013  FINDINGS: CT CHEST FINDINGS  Evaluation of the lung parenchyma is constrained by respiratory motion. Mild dependent atelectasis in the right lower lobe. No suspicious pulmonary nodules. No pleural effusion or pneumothorax.   Visualized thyroid is unremarkable.  The heart is normal in size. No pericardial effusion. Mild atherosclerotic calcifications of the aortic arch.  No suspicious mediastinal, hilar, or axillary lymphadenopathy.  Mild degenerative changes of the lower thoracic spine.  CT ABDOMEN AND PELVIS FINDINGS  Cirrhosis. 3.7 x 3.7 x 3.6 cm enhancing lesion in the  anterior segment right hepatic dome (series 2/ image 16). 4.7 x 4.9 x 4.3 cm enhancing lesion in the lateral segment left hepatic lobe (series 2/ image 19). Both lesions are compatible with [place] on MRI.  Splenomegaly, measuring 16.0 cm in maximal craniocaudal dimension.  Small gastroesophageal varices. Recanalized paraumbilical vein. Portal vein is patient.  Periportal and portacaval lymphadenopathy, measuring up to 1.8 cm short axis (series 6/image 35), indeterminate. Small retroperitoneal nodes measuring up to 8 mm short axis (series 6/image 72), which do not meet pathologic CT size criteria.  Trace pelvic ascites.  Status post cholecystectomy. No intrahepatic or extrahepatic ductal dilatation.  Pancreas and adrenal glands are within normal limits.  Kidney is within normal limits.  No hydronephrosis.  No evidence of bowel obstruction. Normal appendix. Mild colonic wall thickening with pericolonic stranding in the right mid abdomen (Series 4/image 141), favored to reflect incidental/secondary finding, less likely mild infectious/inflammatory colitis.  No evidence of abdominal aortic aneurysm. Circumaortic left renal vein.  Uterus and bilateral ovaries are unremarkable.  Bladder is within normal limits.  Visualized osseous structures are within normal limits.  IMPRESSION: 3.7 cm enhancing lesion in the anterior segment right hepatic dome and 4.9 cm enhancing lesion in the lateral segment left hepatic lobe, both of which are compatible with [place] on MRI.  Mild periportal and portacaval lymphadenopathy, indeterminate.  No evidence of distant metastases in the chest,  abdomen, or pelvis.  Cirrhosis with stigmata of portal hypertension, including splenomegaly and small gastroesophageal varices. Portal vein is patent.   Electronically Signed   By: Julian Hy M.D.   On: 12/22/2013 09:11     ASSESSMENT & PLAN:  Liver cirrhosis She has significant portal hypertension. I recommend the patient to abstain from alcoholism. I also recommend discontinuation of Vicodin/Norc and to restrict Tylenol intake.  Cancer, hepatocellular The patient is not a candidate for surgical resection. The disease appeared to be localized in the liver. I recommend referral to GI/interventional radiologist for liver ablation/chemoembolization. If the patient is not a candidate for the local treatment, I recommend chemotherapy with sorafenib. I discussed with the patient the risk and benefit and side effects of sorafenib and she agreed for the consult with interventional radiologists first before consideration for palliative chemotherapy. We discussed prognosis. Without treatment, her survival is measured in less than 6 months. However, with treatment, she may live more than a year. All forms of treatment are palliative in nature and her disease is not curable.  Splenomegaly This is due to liver cirrhosis. Recommend observation only.  Anemia of other chronic disease This is likely anemia of chronic disease. The patient denies recent history of bleeding such as epistaxis, hematuria or hematochezia. She is asymptomatic from the anemia. We will observe for now.  She does not require transfusion now.    Thrombocytopenia This is due to liver cirrhosis and splenomegaly. Recommend observation only.  Abdominal pain, unspecified site I discontinue Vicodin. I prescribed morphine sulfate as needed for pain. We discussed about the role of pain management in the oncology clinic. The patient agreed to be compliant with prescribed pain regimen and promised not to share the prescribed  medications. Any lost medications or missed prescription will not be refilled sooner than anticipated time when the patient's prescription is expected to run out. We also discussed narcotics refill policy in the clinic.  The patient is educated to check the pill bottles in the middle of the week and ensure there is adequate supply to last through the weekend  until next appointment or available business day.  The oncology service has a strict policy not to refill pain medications after business hours or the weekend.  If the patient is found to have violated the verbal agreement as stated no further pain medications will be prescribed in the future.    TOBACCO USE I spent some time counseling the patient the importance of tobacco cessation. she is currently attempting to quit on her own      Orders Placed This Encounter  Procedures  . CBC with Differential    Standing Status: Future     Number of Occurrences:      Standing Expiration Date: 12/23/2014  . Comprehensive metabolic panel    Standing Status: Future     Number of Occurrences:      Standing Expiration Date: 12/23/2014  . APTT    Standing Status: Future     Number of Occurrences:      Standing Expiration Date: 12/23/2014  . Protime-INR    Standing Status: Future     Number of Occurrences:      Standing Expiration Date: 12/23/2014  . AFP tumor marker    Standing Status: Future     Number of Occurrences:      Standing Expiration Date: 12/23/2014  . Ambulatory referral to Interventional Radiology    Referral Priority:  Routine    Referral Type:  Consultation    Referral Reason:  Specialty Services Required    Requested Specialty:  Radiation Oncology    Number of Visits Requested:  1   All questions were answered. The patient knows to call the clinic with any problems, questions or concerns. No barriers to learning was detected.    Heath Lark, MD 12/23/2013 4:11 PM

## 2013-12-23 NOTE — Assessment & Plan Note (Signed)
This is likely anemia of chronic disease. The patient denies recent history of bleeding such as epistaxis, hematuria or hematochezia. She is asymptomatic from the anemia. We will observe for now.  She does not require transfusion now.   

## 2013-12-26 ENCOUNTER — Encounter: Payer: Self-pay | Admitting: *Deleted

## 2013-12-26 ENCOUNTER — Other Ambulatory Visit: Payer: Self-pay | Admitting: Hematology and Oncology

## 2013-12-26 ENCOUNTER — Ambulatory Visit: Payer: Self-pay | Admitting: Hematology and Oncology

## 2013-12-26 DIAGNOSIS — C220 Liver cell carcinoma: Secondary | ICD-10-CM

## 2013-12-26 NOTE — Progress Notes (Signed)
[place] Work  Clinical Social Work was referred by Therapist, sports to complete healthcare advance directives.  CSW contacted patient by phone.  The patient states she is interested in completing directives and would like for her son to help complete.  Patient plans to contact CSW to arrange an appointment.   Polo Riley, MSW, LCSW, OSW-C Clinical Social Worker [facility] [phone]

## 2013-12-27 ENCOUNTER — Telehealth: Payer: Self-pay | Admitting: *Deleted

## 2013-12-27 NOTE — Telephone Encounter (Signed)
Call from new pt coordinator at [facility].  States Dr. Fuller Plan reviewed pt's chart and feels no need to see this pt.Marland Kitchen He feels it is appropriate for I.R. to manage liver ablation and he is not sure what else he could do for pt..  Please call back if there is something else Dr. Alvy Bimler wanted GI to evaluate/treat on pt, otherwise they will cancel the referral.

## 2013-12-28 ENCOUNTER — Telehealth: Payer: Self-pay | Admitting: *Deleted

## 2013-12-28 NOTE — Telephone Encounter (Signed)
I do not prescribe vicodin. If she wants vicodin she needs to follow with prior doctor. She did not tell me she was under a pain contract.  Morphine only or none

## 2013-12-28 NOTE — Telephone Encounter (Signed)
Call from [place] at Dr. Leontine Locket office states pt called them to report her new Rx for morphine from Dr. Alvy Bimler is making her feel funny.  She wants to switch back to Vicodin as previously prescribed by Dr. [name].  They state pt has been on pain contract w/ their clinic and pt breached contract by getting pain meds from Dr. Alvy Bimler.  They are willing to continue to manage pt's pain, but Dr. [name] is currently out of office and they ask if Dr. Alvy Bimler willing to write pt Rx for vicodin and d/c the morphine?  Informed Benjamine Mola that Dr. Alvy Bimler instructed pt to stop vicodin d/t the tylenol.  Pt was instructed to avoid tylenol.  Faxed over office note from Dr. Alvy Bimler to Dr. Leontine Locket office.   [name] relayed above to Dr. [name].  Informs Dr. [name] will release pt from her pain contract w/ them and please allow Dr. Alvy Bimler to manage pt's pain.  Benjamine Mola has notified pt's son and she will call pt herself to instruct pt to call Dr. Calton Dach office for any problems or concerns w/ her pain medication.

## 2013-12-28 NOTE — Telephone Encounter (Signed)
Spoke w/ both pt and son.  Informed Dr. Alvy Bimler will only prescribe morphine.  Pt can choose to go back to Dr. [name] for pain management if she does not want to continue on morphine.  Pt states will discuss w/ son.  Son reports pt was hallucinating on the morphine and itching a lot.  Suggested she may try a lower dose of morphine or she can contact Dr. Leontine Locket office for pain meds.  He verbalized understanding and will discuss w/ pt.Marland Kitchen Informed [name] at Dr. Leontine Locket office regarding above.

## 2013-12-29 ENCOUNTER — Encounter (HOSPITAL_COMMUNITY): Payer: Self-pay | Admitting: Emergency Medicine

## 2013-12-29 ENCOUNTER — Inpatient Hospital Stay (HOSPITAL_COMMUNITY)
Admission: EM | Admit: 2013-12-29 | Discharge: 2014-01-03 | DRG: 602 | Disposition: A | Discharge status: Home Health | Payer: Medicare Other | Attending: Internal Medicine | Admitting: Internal Medicine

## 2013-12-29 ENCOUNTER — Ambulatory Visit: Payer: Self-pay | Admitting: Gastroenterology

## 2013-12-29 ENCOUNTER — Inpatient Hospital Stay (HOSPITAL_COMMUNITY): Payer: Medicare Other

## 2013-12-29 DIAGNOSIS — M545 Low back pain, unspecified: Secondary | ICD-10-CM

## 2013-12-29 DIAGNOSIS — F1910 Other psychoactive substance abuse, uncomplicated: Secondary | ICD-10-CM

## 2013-12-29 DIAGNOSIS — E43 Unspecified severe protein-calorie malnutrition: Secondary | ICD-10-CM | POA: Diagnosis present

## 2013-12-29 DIAGNOSIS — G8929 Other chronic pain: Secondary | ICD-10-CM | POA: Diagnosis present

## 2013-12-29 DIAGNOSIS — K7460 Unspecified cirrhosis of liver: Secondary | ICD-10-CM

## 2013-12-29 DIAGNOSIS — S79919A Unspecified injury of unspecified hip, initial encounter: Secondary | ICD-10-CM | POA: Diagnosis present

## 2013-12-29 DIAGNOSIS — J449 Chronic obstructive pulmonary disease, unspecified: Secondary | ICD-10-CM | POA: Diagnosis present

## 2013-12-29 DIAGNOSIS — S79929A Unspecified injury of unspecified thigh, initial encounter: Secondary | ICD-10-CM

## 2013-12-29 DIAGNOSIS — K7682 Hepatic encephalopathy: Secondary | ICD-10-CM

## 2013-12-29 DIAGNOSIS — M129 Arthropathy, unspecified: Secondary | ICD-10-CM | POA: Diagnosis present

## 2013-12-29 DIAGNOSIS — Z8249 Family history of ischemic heart disease and other diseases of the circulatory system: Secondary | ICD-10-CM

## 2013-12-29 DIAGNOSIS — R209 Unspecified disturbances of skin sensation: Secondary | ICD-10-CM

## 2013-12-29 DIAGNOSIS — L98499 Non-pressure chronic ulcer of skin of other sites with unspecified severity: Secondary | ICD-10-CM | POA: Diagnosis present

## 2013-12-29 DIAGNOSIS — K7290 Hepatic failure, unspecified without coma: Secondary | ICD-10-CM

## 2013-12-29 DIAGNOSIS — L03317 Cellulitis of buttock: Principal | ICD-10-CM

## 2013-12-29 DIAGNOSIS — R161 Splenomegaly, not elsewhere classified: Secondary | ICD-10-CM

## 2013-12-29 DIAGNOSIS — D638 Anemia in other chronic diseases classified elsewhere: Secondary | ICD-10-CM

## 2013-12-29 DIAGNOSIS — C220 Liver cell carcinoma: Secondary | ICD-10-CM

## 2013-12-29 DIAGNOSIS — D759 Disease of blood and blood-forming organs, unspecified: Secondary | ICD-10-CM | POA: Diagnosis present

## 2013-12-29 DIAGNOSIS — B1921 Unspecified viral hepatitis C with hepatic coma: Secondary | ICD-10-CM | POA: Diagnosis present

## 2013-12-29 DIAGNOSIS — D709 Neutropenia, unspecified: Secondary | ICD-10-CM | POA: Diagnosis present

## 2013-12-29 DIAGNOSIS — D61818 Other pancytopenia: Secondary | ICD-10-CM

## 2013-12-29 DIAGNOSIS — K766 Portal hypertension: Secondary | ICD-10-CM | POA: Diagnosis present

## 2013-12-29 DIAGNOSIS — N950 Postmenopausal bleeding: Secondary | ICD-10-CM

## 2013-12-29 DIAGNOSIS — F319 Bipolar disorder, unspecified: Secondary | ICD-10-CM

## 2013-12-29 DIAGNOSIS — N920 Excessive and frequent menstruation with regular cycle: Secondary | ICD-10-CM

## 2013-12-29 DIAGNOSIS — Z515 Encounter for palliative care: Secondary | ICD-10-CM

## 2013-12-29 DIAGNOSIS — M5412 Radiculopathy, cervical region: Secondary | ICD-10-CM

## 2013-12-29 DIAGNOSIS — E876 Hypokalemia: Secondary | ICD-10-CM

## 2013-12-29 DIAGNOSIS — L02419 Cutaneous abscess of limb, unspecified: Secondary | ICD-10-CM | POA: Diagnosis present

## 2013-12-29 DIAGNOSIS — R300 Dysuria: Secondary | ICD-10-CM

## 2013-12-29 DIAGNOSIS — C228 Malignant neoplasm of liver, primary, unspecified as to type: Secondary | ICD-10-CM | POA: Diagnosis present

## 2013-12-29 DIAGNOSIS — J4489 Other specified chronic obstructive pulmonary disease: Secondary | ICD-10-CM

## 2013-12-29 DIAGNOSIS — L989 Disorder of the skin and subcutaneous tissue, unspecified: Secondary | ICD-10-CM

## 2013-12-29 DIAGNOSIS — I10 Essential (primary) hypertension: Secondary | ICD-10-CM

## 2013-12-29 DIAGNOSIS — Z8614 Personal history of Methicillin resistant Staphylococcus aureus infection: Secondary | ICD-10-CM

## 2013-12-29 DIAGNOSIS — Z9089 Acquired absence of other organs: Secondary | ICD-10-CM

## 2013-12-29 DIAGNOSIS — Z6825 Body mass index (BMI) 25.0-25.9, adult: Secondary | ICD-10-CM

## 2013-12-29 DIAGNOSIS — N760 Acute vaginitis: Secondary | ICD-10-CM

## 2013-12-29 DIAGNOSIS — L03119 Cellulitis of unspecified part of limb: Secondary | ICD-10-CM

## 2013-12-29 DIAGNOSIS — T502X5A Adverse effect of carbonic-anhydrase inhibitors, benzothiadiazides and other diuretics, initial encounter: Secondary | ICD-10-CM | POA: Diagnosis present

## 2013-12-29 DIAGNOSIS — D61810 Antineoplastic chemotherapy induced pancytopenia: Secondary | ICD-10-CM | POA: Diagnosis present

## 2013-12-29 DIAGNOSIS — R188 Other ascites: Secondary | ICD-10-CM | POA: Diagnosis present

## 2013-12-29 DIAGNOSIS — T24019A Burn of unspecified degree of unspecified thigh, initial encounter: Secondary | ICD-10-CM | POA: Diagnosis present

## 2013-12-29 DIAGNOSIS — J40 Bronchitis, not specified as acute or chronic: Secondary | ICD-10-CM

## 2013-12-29 DIAGNOSIS — Z79899 Other long term (current) drug therapy: Secondary | ICD-10-CM

## 2013-12-29 DIAGNOSIS — D696 Thrombocytopenia, unspecified: Secondary | ICD-10-CM

## 2013-12-29 DIAGNOSIS — R109 Unspecified abdominal pain: Secondary | ICD-10-CM

## 2013-12-29 DIAGNOSIS — Z888 Allergy status to other drugs, medicaments and biological substances status: Secondary | ICD-10-CM

## 2013-12-29 DIAGNOSIS — Z833 Family history of diabetes mellitus: Secondary | ICD-10-CM

## 2013-12-29 DIAGNOSIS — K219 Gastro-esophageal reflux disease without esophagitis: Secondary | ICD-10-CM | POA: Diagnosis present

## 2013-12-29 DIAGNOSIS — T451X5A Adverse effect of antineoplastic and immunosuppressive drugs, initial encounter: Secondary | ICD-10-CM | POA: Diagnosis present

## 2013-12-29 DIAGNOSIS — L0231 Cutaneous abscess of buttock: Principal | ICD-10-CM

## 2013-12-29 DIAGNOSIS — F17200 Nicotine dependence, unspecified, uncomplicated: Secondary | ICD-10-CM

## 2013-12-29 DIAGNOSIS — G589 Mononeuropathy, unspecified: Secondary | ICD-10-CM | POA: Diagnosis present

## 2013-12-29 DIAGNOSIS — M109 Gout, unspecified: Secondary | ICD-10-CM | POA: Diagnosis present

## 2013-12-29 DIAGNOSIS — X19XXXA Contact with other heat and hot substances, initial encounter: Secondary | ICD-10-CM | POA: Diagnosis present

## 2013-12-29 DIAGNOSIS — E669 Obesity, unspecified: Secondary | ICD-10-CM

## 2013-12-29 HISTORY — DX: Other specified postprocedural states: R11.2

## 2013-12-29 HISTORY — DX: Unspecified cirrhosis of liver: K74.60

## 2013-12-29 HISTORY — DX: Other specified postprocedural states: Z98.890

## 2013-12-29 LAB — CBC
HCT: 31.9 % — ABNORMAL LOW (ref 36.0–46.0)
Hemoglobin: 10.7 g/dL — ABNORMAL LOW (ref 12.0–15.0)
MCH: 30.2 pg (ref 26.0–34.0)
MCHC: 33.5 g/dL (ref 30.0–36.0)
MCV: 90.1 fL (ref 78.0–100.0)
Platelets: 58 10*3/uL — ABNORMAL LOW (ref 150–400)
RBC: 3.54 MIL/uL — AB (ref 3.87–5.11)
RDW: 15.5 % (ref 11.5–15.5)
WBC: 2.4 10*3/uL — AB (ref 4.0–10.5)

## 2013-12-29 LAB — URINALYSIS, ROUTINE W REFLEX MICROSCOPIC
Bilirubin Urine: NEGATIVE
Glucose, UA: NEGATIVE mg/dL
Hgb urine dipstick: NEGATIVE
Ketones, ur: NEGATIVE mg/dL
Leukocytes, UA: NEGATIVE
Nitrite: NEGATIVE
Protein, ur: NEGATIVE mg/dL
Specific Gravity, Urine: 1.006 (ref 1.005–1.030)
UROBILINOGEN UA: 1 mg/dL (ref 0.0–1.0)
pH: 7.5 (ref 5.0–8.0)

## 2013-12-29 LAB — COMPREHENSIVE METABOLIC PANEL
ALT: 48 U/L — ABNORMAL HIGH (ref 0–35)
AST: 123 U/L — AB (ref 0–37)
Albumin: 2.4 g/dL — ABNORMAL LOW (ref 3.5–5.2)
Alkaline Phosphatase: 143 U/L — ABNORMAL HIGH (ref 39–117)
BILIRUBIN TOTAL: 0.8 mg/dL (ref 0.3–1.2)
BUN: 15 mg/dL (ref 6–23)
CHLORIDE: 104 meq/L (ref 96–112)
CO2: 33 meq/L — AB (ref 19–32)
CREATININE: 0.93 mg/dL (ref 0.50–1.10)
Calcium: 8.7 mg/dL (ref 8.4–10.5)
GFR calc Af Amer: 77 mL/min — ABNORMAL LOW (ref >90)
GFR, EST NON AFRICAN AMERICAN: 66 mL/min — AB (ref >90)
Glucose, Bld: 86 mg/dL (ref 70–99)
Potassium: 2.5 mEq/L — CL (ref 3.7–5.3)
Sodium: 144 mEq/L (ref 137–147)
Total Protein: 6.7 g/dL (ref 6.0–8.3)

## 2013-12-29 LAB — BLOOD GAS, ARTERIAL
ACID-BASE EXCESS: 4.7 mmol/L — AB (ref 0.0–2.0)
BICARBONATE: 27.8 meq/L — AB (ref 20.0–24.0)
DRAWN BY: 252031
O2 SAT: 96.9 %
PCO2 ART: 36.5 mmHg (ref 35.0–45.0)
Patient temperature: 37
TCO2: 25.3 mmol/L (ref 0–100)
pH, Arterial: 7.494 — ABNORMAL HIGH (ref 7.350–7.450)
pO2, Arterial: 81.3 mmHg (ref 80.0–100.0)

## 2013-12-29 LAB — ACETAMINOPHEN LEVEL

## 2013-12-29 LAB — RAPID URINE DRUG SCREEN, HOSP PERFORMED
Amphetamines: NOT DETECTED
Barbiturates: NOT DETECTED
Benzodiazepines: NOT DETECTED
Cocaine: NOT DETECTED
Opiates: NOT DETECTED
Tetrahydrocannabinol: NOT DETECTED

## 2013-12-29 LAB — PROTIME-INR
INR: 1.32 (ref 0.00–1.49)
Prothrombin Time: 16.1 seconds — ABNORMAL HIGH (ref 11.6–15.2)

## 2013-12-29 LAB — OCCULT BLOOD X 1 CARD TO LAB, STOOL: Fecal Occult Bld: NEGATIVE

## 2013-12-29 LAB — AMMONIA: Ammonia: 83 umol/L — ABNORMAL HIGH (ref 11–60)

## 2013-12-29 LAB — LIPASE, BLOOD: LIPASE: 37 U/L (ref 11–59)

## 2013-12-29 MED ORDER — LACTULOSE 10 GM/15ML PO SOLN
10.0000 g | Freq: Two times a day (BID) | ORAL | Status: DC
  Administered 2013-12-30 (×2): 10 g via ORAL
  Filled 2013-12-29 (×5): qty 15

## 2013-12-29 MED ORDER — POTASSIUM CHLORIDE CRYS ER 20 MEQ PO TBCR: 40.0000 meq | EXTENDED_RELEASE_TABLET | Freq: Two times a day (BID) | ORAL | Status: DC

## 2013-12-29 MED ORDER — ALUM & MAG HYDROXIDE-SIMETH 200-200-20 MG/5ML PO SUSP: 30.0000 mL | Freq: Four times a day (QID) | ORAL | Status: DC | PRN

## 2013-12-29 MED ORDER — POTASSIUM CHLORIDE CRYS ER 20 MEQ PO TBCR
40.0000 meq | EXTENDED_RELEASE_TABLET | Freq: Two times a day (BID) | ORAL | Status: AC
  Administered 2013-12-29 – 2013-12-30 (×2): 40 meq via ORAL
  Filled 2013-12-29 (×2): qty 2

## 2013-12-29 MED ORDER — OXYCODONE HCL 5 MG PO TABS
5.0000 mg | ORAL_TABLET | ORAL | Status: DC | PRN
  Administered 2013-12-30 – 2014-01-03 (×13): 5 mg via ORAL
  Filled 2013-12-29 (×13): qty 1

## 2013-12-29 MED ORDER — ONDANSETRON HCL 4 MG/2ML IJ SOLN: 4.0000 mg | Freq: Four times a day (QID) | INTRAMUSCULAR | Status: DC | PRN

## 2013-12-29 MED ORDER — ZOLPIDEM TARTRATE 5 MG PO TABS
5.0000 mg | ORAL_TABLET | Freq: Every day | ORAL | Status: DC
  Administered 2013-12-30 – 2014-01-02 (×4): 5 mg via ORAL
  Filled 2013-12-29 (×5): qty 1

## 2013-12-29 MED ORDER — DIVALPROEX SODIUM 250 MG PO DR TAB
250.0000 mg | DELAYED_RELEASE_TABLET | Freq: Two times a day (BID) | ORAL | Status: DC
  Administered 2013-12-29 – 2014-01-03 (×10): 250 mg via ORAL
  Filled 2013-12-29 (×11): qty 1

## 2013-12-29 MED ORDER — SODIUM CHLORIDE 0.9 % IV BOLUS (SEPSIS)
1000.0000 mL | Freq: Once | INTRAVENOUS | Status: AC
  Administered 2013-12-29: 1000 mL via INTRAVENOUS

## 2013-12-29 MED ORDER — FLEET ENEMA 7-19 GM/118ML RE ENEM: 1.0000 | ENEMA | Freq: Once | RECTAL | Status: AC | PRN

## 2013-12-29 MED ORDER — DOCUSATE SODIUM 100 MG PO CAPS
100.0000 mg | ORAL_CAPSULE | Freq: Two times a day (BID) | ORAL | Status: DC
  Administered 2013-12-30: 100 mg via ORAL
  Filled 2013-12-29 (×5): qty 1

## 2013-12-29 MED ORDER — VANCOMYCIN HCL IN DEXTROSE 1-5 GM/200ML-% IV SOLN
1000.0000 mg | Freq: Once | INTRAVENOUS | Status: AC
  Administered 2013-12-29: 1000 mg via INTRAVENOUS
  Filled 2013-12-29: qty 200

## 2013-12-29 MED ORDER — ONDANSETRON HCL 4 MG PO TABS: 4.0000 mg | ORAL_TABLET | Freq: Four times a day (QID) | ORAL | Status: DC | PRN

## 2013-12-29 MED ORDER — ADULT MULTIVITAMIN W/MINERALS CH
1.0000 | ORAL_TABLET | Freq: Every day | ORAL | Status: DC
  Administered 2013-12-29 – 2014-01-03 (×6): 1 via ORAL
  Filled 2013-12-29 (×6): qty 1

## 2013-12-29 MED ORDER — PIPERACILLIN-TAZOBACTAM 3.375 G IVPB
3.3750 g | Freq: Three times a day (TID) | INTRAVENOUS | Status: DC
  Administered 2013-12-29 – 2014-01-03 (×14): 3.375 g via INTRAVENOUS
  Filled 2013-12-29 (×16): qty 50

## 2013-12-29 MED ORDER — FUROSEMIDE 20 MG PO TABS
20.0000 mg | ORAL_TABLET | Freq: Every day | ORAL | Status: DC
  Administered 2013-12-30 – 2014-01-03 (×5): 20 mg via ORAL
  Filled 2013-12-29 (×5): qty 1

## 2013-12-29 MED ORDER — HYDROCHLOROTHIAZIDE 25 MG PO TABS
25.0000 mg | ORAL_TABLET | Freq: Every day | ORAL | Status: DC
  Administered 2013-12-29 – 2013-12-30 (×2): 25 mg via ORAL
  Filled 2013-12-29 (×3): qty 1

## 2013-12-29 MED ORDER — PREGABALIN 75 MG PO CAPS
75.0000 mg | ORAL_CAPSULE | Freq: Two times a day (BID) | ORAL | Status: DC
  Administered 2013-12-29 – 2014-01-03 (×10): 75 mg via ORAL
  Filled 2013-12-29 (×10): qty 1

## 2013-12-29 MED ORDER — COLCHICINE 0.6 MG PO TABS
0.6000 mg | ORAL_TABLET | Freq: Two times a day (BID) | ORAL | Status: DC
  Administered 2013-12-29 – 2014-01-03 (×10): 0.6 mg via ORAL
  Filled 2013-12-29 (×11): qty 1

## 2013-12-29 MED ORDER — CLONAZEPAM 1 MG PO TABS
1.0000 mg | ORAL_TABLET | Freq: Four times a day (QID) | ORAL | Status: DC | PRN
  Administered 2013-12-31 – 2014-01-02 (×7): 1 mg via ORAL
  Filled 2013-12-29 (×2): qty 1
  Filled 2013-12-29 (×5): qty 2

## 2013-12-29 MED ORDER — SENNOSIDES-DOCUSATE SODIUM 8.6-50 MG PO TABS: 1.0000 | ORAL_TABLET | Freq: Every evening | ORAL | Status: DC | PRN

## 2013-12-29 MED ORDER — IPRATROPIUM-ALBUTEROL 0.5-2.5 (3) MG/3ML IN SOLN
3.0000 mL | RESPIRATORY_TRACT | Status: DC
  Administered 2013-12-29: 3 mL via RESPIRATORY_TRACT
  Filled 2013-12-29: qty 3

## 2013-12-29 MED ORDER — GABAPENTIN 300 MG PO CAPS
300.0000 mg | ORAL_CAPSULE | Freq: Three times a day (TID) | ORAL | Status: DC
  Administered 2013-12-29 – 2014-01-03 (×14): 300 mg via ORAL
  Filled 2013-12-29 (×16): qty 1

## 2013-12-29 MED ORDER — PANTOPRAZOLE SODIUM 40 MG PO TBEC
40.0000 mg | DELAYED_RELEASE_TABLET | Freq: Every day | ORAL | Status: DC
  Administered 2013-12-30 – 2014-01-03 (×5): 40 mg via ORAL
  Filled 2013-12-29 (×5): qty 1

## 2013-12-29 MED ORDER — BUDESONIDE-FORMOTEROL FUMARATE 160-4.5 MCG/ACT IN AERO
2.0000 | INHALATION_SPRAY | Freq: Two times a day (BID) | RESPIRATORY_TRACT | Status: DC
  Administered 2013-12-29 – 2014-01-03 (×10): 2 via RESPIRATORY_TRACT
  Filled 2013-12-29: qty 6

## 2013-12-29 MED ORDER — VANCOMYCIN HCL IN DEXTROSE 750-5 MG/150ML-% IV SOLN
750.0000 mg | Freq: Two times a day (BID) | INTRAVENOUS | Status: DC
  Administered 2013-12-30 – 2014-01-03 (×9): 750 mg via INTRAVENOUS
  Filled 2013-12-29 (×9): qty 150

## 2013-12-29 MED ORDER — FOLIC ACID 1 MG PO TABS
1.0000 mg | ORAL_TABLET | Freq: Every day | ORAL | Status: DC
  Administered 2013-12-29 – 2014-01-03 (×6): 1 mg via ORAL
  Filled 2013-12-29 (×6): qty 1

## 2013-12-29 MED ORDER — VITAMIN B-1 100 MG PO TABS
100.0000 mg | ORAL_TABLET | Freq: Every day | ORAL | Status: DC
  Administered 2013-12-29 – 2014-01-03 (×6): 100 mg via ORAL
  Filled 2013-12-29 (×6): qty 1

## 2013-12-29 MED ORDER — BISACODYL 10 MG RE SUPP: 10.0000 mg | Freq: Every day | RECTAL | Status: DC | PRN

## 2013-12-29 NOTE — ED Provider Notes (Signed)
CSN: [id number]     Arrival date & time 12/29/13  1532 History   First MD Initiated Contact with Patient 12/29/13 1602     Chief Complaint  Patient presents with  . Wound Infection  . Cancer     (Consider location/radiation/quality/duration/timing/severity/associated sxs/prior Treatment) Patient is a 58 y.o. female presenting with rash. The history is provided by the patient.  Rash Location:  Leg Leg rash location:  L upper leg and R upper leg Quality: blistering, bruising and redness   Severity:  Moderate Onset quality:  Gradual Timing:  Constant Progression:  Worsening Chronicity:  New Relieved by:  Nothing Worsened by:  Nothing tried Ineffective treatments:  None tried Associated symptoms: no abdominal pain, no fever, no nausea, no shortness of breath, not vomiting and not wheezing     Past Medical History  Diagnosis Date  . Hypertension   . Bipolar 1 disorder   . Menorrhagia   . Post-menopausal bleeding     MOST RECENT BLEEDING  WAS NOV 2013  . Paresthesia   . COPD (chronic obstructive pulmonary disease)   . 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 Gout   . Mental disorder     bipolar  . Asthma   . Shortness of breath   . Anemia   . GERD (gastroesophageal reflux disease)   . Gallstones     ABD PAIN, N&V  . Arthritis   . Headache(784.0)   . Hepatitis     Hep C x 10 yrs  - NO TREATMENT   Past Surgical History  Procedure Laterality Date  . Tubal ligation    . Facial reconstruction surgery    . Multiple tooth extractions    . Hysteroscopy w/d&c  09/29/2011    Procedure: DILATATION AND CURETTAGE /HYSTEROSCOPY;  Surgeon: Mora Bellman, MD;  Location: [facility];  Service: Gynecology;  Laterality: N/A;  . Cholecystectomy  08/20/2012    Procedure: LAPAROSCOPIC CHOLECYSTECTOMY;  Surgeon: Ralene Ok, MD;  Location: [facility];  Service: General;;   Family History  Problem Relation Age of Onset  . COPD Mother   . Diabetes Mother   . Heart disease  Mother   . Diabetes Father   . Heart disease Father   . Hypertension Father   . Cancer Father     basal cell ca and prostate ca   History  Substance Use Topics  . Smoking status: Current Every Day Smoker -- 0.25 packs/day for 30 years    Types: Cigarettes  . Smokeless tobacco: Never Used     Comment: in process of quitting---would like nicotine patch if insurance covers it  . Alcohol Use: No     Comment: recovering alcoholic    NO COCAINE SINCE 1990   OB History   Grav Para Term Preterm Abortions TAB SAB Ect Mult Living   8 4 4  0 4 1 3   4      Review of Systems  Constitutional: Negative for fever.  Respiratory: Negative for cough, shortness of breath and wheezing.   Gastrointestinal: Negative for nausea, vomiting and abdominal pain.  Skin: Positive for rash.  All other systems reviewed and are negative.     Allergies  Tylenol  Home Medications   Prior to Admission medications   Medication Sig Start Date End Date Taking? Authorizing Provider  budesonide-formoterol (SYMBICORT) 160-4.5 MCG/ACT inhaler Inhale 2 puffs into the lungs 2 (two) times daily. 05/24/12  Yes Darl Pikes, MD  clonazePAM (KLONOPIN) 1  MG tablet Take 1 mg by mouth 4 (four) times daily as needed for anxiety.    Yes Historical Provider, MD  colchicine (COLCRYS) 0.6 MG tablet Take 1 tablet (0.6 mg total) by mouth 2 (two) times daily. 11/27/13  Yes Janice Norrie, MD  Cyanocobalamin (B-12 PO) Take 1 capsule by mouth daily.   Yes Historical Provider, MD  divalproex (DEPAKOTE) 250 MG DR tablet Take 250 mg by mouth 2 (two) times daily.   Yes Historical Provider, MD  furosemide (LASIX) 20 MG tablet Take 20 mg by mouth daily.   Yes Historical Provider, MD  gabapentin (NEURONTIN) 300 MG capsule Take 300 mg by mouth 3 (three) times daily. 10/17/13  Yes Historical Provider, MD  hydrochlorothiazide (HYDRODIURIL) 25 MG tablet Take 25 mg by mouth at bedtime.  07/20/12  Yes Historical Provider, MD  morphine (MSIR) 15  MG tablet Take 1 tablet (15 mg total) by mouth every 8 (eight) hours as needed for severe pain. 12/23/13  Yes Heath Lark, MD  omeprazole (PRILOSEC) 20 MG capsule Take 20 mg by mouth daily.   Yes Historical Provider, MD  pregabalin (LYRICA) 75 MG capsule Take 75 mg by mouth 2 (two) times daily.   Yes Historical Provider, MD  Thiamine HCl (B-1 PO) Take 1 capsule by mouth daily.   Yes Historical Provider, MD  zolpidem (AMBIEN) 10 MG tablet Take 10 mg by mouth at bedtime.   Yes Historical Provider, MD   BP 96/61  Pulse 82  Temp(Src) 98.5 F (36.9 C) (Oral)  Resp 20  SpO2 92%  LMP 05/21/2012 Physical Exam  Nursing note and vitals reviewed. Constitutional: She is oriented to person, place, and time. She appears well-developed and well-nourished. No distress.  HENT:  Head: Normocephalic and atraumatic.  Mouth/Throat: Oropharynx is clear and moist. No oropharyngeal exudate.  Eyes: EOM are normal. Pupils are equal, round, and reactive to light.  Neck: Normal range of motion. Neck supple.  Cardiovascular: Normal rate and regular rhythm.  Exam reveals no friction rub.   No murmur heard. Pulmonary/Chest: Effort normal and breath sounds normal. No respiratory distress. She has no wheezes. She has no rales.  Abdominal: Soft. She exhibits no distension. There is no tenderness. There is no rebound.  Musculoskeletal: Normal range of motion. She exhibits no edema.  Neurological: She is alert and oriented to person, place, and time. No cranial nerve deficit. She exhibits normal muscle tone. Coordination normal.  Skin: Skin is warm. No rash noted. She is not diaphoretic. There is erythema.     Blister on L lateral thigh 4 cm in diameter with surrounding erythema, induration, tenderness Lesion on R posterior thigh with surrounding erythema, tenderness. Lesions have purpuric center.    ED Course  Procedures (including critical care time) Labs Review Labs Reviewed  CBC  COMPREHENSIVE METABOLIC PANEL    AMMONIA  URINE RAPID DRUG SCREEN (HOSP PERFORMED)  LIPASE, BLOOD  URINALYSIS, ROUTINE W REFLEX MICROSCOPIC    Imaging Review No results found.   EKG Interpretation None      MDM   Final diagnoses:  Hepatic encephalopathy  Hypokalemia  Skin lesions    58 year old female her here with concerns of skin infection. Recent diagnosis of liver cancer. She was recently put on morphine for her chronic pain because she had to stop taking Vicodin daily the acetaminophen in it. Patient is accompanied by daughters who state that she is acting more loopy. They believe it is secondary to the morphine. She has had episodes  of confusion where she is climbing around the house, crawling on the floor, not acting like herself whatsoever. Here she is alert and oriented to situation. Her speech is slow. She has no abdominal pain. She has 2 blisters on her legs. Largest is on the left lateral upper thigh, below the trochanter of the hip. It is roughly 4 cm in diameter and has concerning erythema and mild induration. She has another lesion similar on the posterior right thigh. These are not in places consistent with pressure ulcers. Patient is not bed bound. She states lesion started out as blisters and are extremely painful. She has no mucosal lesions. Will check basic labs, check her synthetic liver function. I am concerned she might have skin infection around these blisters. Labs with elevated ammonia c/w hepatic encephalopathy, hypokalemia, leukopenia. Patient admitted to medicine.  Osvaldo Shipper, MD 12/30/13 [phone]

## 2013-12-29 NOTE — ED Notes (Signed)
Pt has liver cancer.  Was put on morphine last week.  Family states that she has been talking out of her head since she was started on that med.  Now has several sores to her bottom that started 2 days ago.  Daughter states that she thinks it is MRSA or staph.

## 2013-12-29 NOTE — H&P (Signed)
[facility] History and Physical  Dorothy Burns FXT:[id number] DOB: [birth year] DOA: 12/29/2013  Referring physician: Murlean Caller, MD PCP: Barbette Merino, MD   Chief Complaint: Cellulitis  HPI: Dorothy Burns is a 58 y.o. female with multiple medical problems including cirrhosis of the liver and liver cancer hepatitis C polysubstance use presents to the ED with complaints of boils on her legs. She states that she noticed the lesions a few days ago. She states that she has had some drainage noted. She also states that she has a history of MRSA in the past. She recently was diagnosed with liver cancer followed by oncology and not deemed a surgical candidate but was to start chemotherapy mainly as a palliative care measure. She states that she had been started on morphine at home and was told to stop vicodin and percocet due to her having cirrhosis of the liver. Since this time she believes that she has had these boils appear. She has not had a fever and she has had no chills. She does admit to having diarrhea. She states that she has chronic abdominal pain noted.    Review of Systems:  Constitutional:  No weight loss, night sweats, Fevers, chills, ++fatigue.  HEENT:  No headaches  Cardio-vascular:  No chest pain, Orthopnea, PND, swelling in lower extremities  GI:  No heartburn, indigestion, ++abdominal pain, ++nausea, vomiting, ++diarrhea, ++loss of appetite  Resp:  No shortness of breath with exertion or at rest. No excess mucus, no productive cough Skin:  no rash or lesions.  GU:  no dysuria, change in color of urine, no urgency or frequency Musculoskeletal:  No joint pain or swelling.  ++back pain.  Psych:  No change in mood or affect. No depression or anxiety. ++memory loss.   Past Medical History  Diagnosis Date  . Hypertension   . Bipolar 1 disorder   . Menorrhagia   . Post-menopausal bleeding     MOST RECENT BLEEDING  WAS NOV 2013  . Paresthesia   . COPD (chronic  obstructive pulmonary disease)   . 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 Gout   . Mental disorder     bipolar  . Asthma   . Shortness of breath   . Anemia   . GERD (gastroesophageal reflux disease)   . Gallstones     ABD PAIN, N&V  . Arthritis   . Headache(784.0)   . Hepatitis     Hep C x 10 yrs  - NO TREATMENT  . PONV (postoperative nausea and vomiting)   . Cirrhosis    Past Surgical History  Procedure Laterality Date  . Tubal ligation    . Facial reconstruction surgery    . Multiple tooth extractions    . Hysteroscopy w/d&c  09/29/2011    Procedure: DILATATION AND CURETTAGE /HYSTEROSCOPY;  Surgeon: Mora Bellman, MD;  Location: [facility];  Service: Gynecology;  Laterality: N/A;  . Cholecystectomy  08/20/2012    Procedure: LAPAROSCOPIC CHOLECYSTECTOMY;  Surgeon: Ralene Ok, MD;  Location: [facility];  Service: General;;   Social History:  reports that she has been smoking Cigarettes.  She has a 7.5 pack-year smoking history. She has never used smokeless tobacco. She reports that she does not drink alcohol or use illicit drugs.  Allergies  Allergen Reactions  . Tylenol [Acetaminophen]     Liver issues    Family History  Problem Relation Age of Onset  . COPD Mother   . Diabetes Mother   .  Heart disease Mother   . Diabetes Father   . Heart disease Father   . Hypertension Father   . Cancer Father     basal cell ca and prostate ca     Prior to Admission medications   Medication Sig Start Date End Date Taking? Authorizing Provider  budesonide-formoterol (SYMBICORT) 160-4.5 MCG/ACT inhaler Inhale 2 puffs into the lungs 2 (two) times daily. 05/24/12  Yes Jacquelyn A McGill, MD  clonazePAM (KLONOPIN) 1 MG tablet Take 1 mg by mouth 4 (four) times daily as needed for anxiety.    Yes Historical Provider, MD  colchicine (COLCRYS) 0.6 MG tablet Take 1 tablet (0.6 mg total) by mouth 2 (two) times daily. 11/27/13  Yes Janice Norrie, MD  Cyanocobalamin (B-12 PO) Take 1  capsule by mouth daily.   Yes Historical Provider, MD  divalproex (DEPAKOTE) 250 MG DR tablet Take 250 mg by mouth 2 (two) times daily.   Yes Historical Provider, MD  furosemide (LASIX) 20 MG tablet Take 20 mg by mouth daily.   Yes Historical Provider, MD  gabapentin (NEURONTIN) 300 MG capsule Take 300 mg by mouth 3 (three) times daily. 10/17/13  Yes Historical Provider, MD  hydrochlorothiazide (HYDRODIURIL) 25 MG tablet Take 25 mg by mouth at bedtime.  07/20/12  Yes Historical Provider, MD  morphine (MSIR) 15 MG tablet Take 1 tablet (15 mg total) by mouth every 8 (eight) hours as needed for severe pain. 12/23/13  Yes Heath Lark, MD  omeprazole (PRILOSEC) 20 MG capsule Take 20 mg by mouth daily.   Yes Historical Provider, MD  pregabalin (LYRICA) 75 MG capsule Take 75 mg by mouth 2 (two) times daily.   Yes Historical Provider, MD  Thiamine HCl (B-1 PO) Take 1 capsule by mouth daily.   Yes Historical Provider, MD  zolpidem (AMBIEN) 10 MG tablet Take 10 mg by mouth at bedtime.   Yes Historical Provider, MD   Physical Exam: Filed Vitals:   12/29/13 2029  BP: 110/79  Pulse: 70  Temp: 98 F (36.7 C)  Resp: 16    BP 110/79  Pulse 70  Temp(Src) 98 F (36.7 C) (Oral)  Resp 16  SpO2 100%  LMP 05/21/2012  General:  Appears calm and comfortable Eyes: PERRL, normal lids, irises & conjunctiva ENT: grossly normal hearing, lips & tongue dry mouth Neck: no LAD, masses or thyromegaly Cardiovascular: RRR, no m/r/g. No LE edema. Respiratory: CTA bilaterally, no w/r/r. Normal respiratory effort. Abdomen: soft, ++hepatomegaly ++splenomegaly Skin: area of infection noted on left hip and under the right thigh appears to be like a boil Musculoskeletal: grossly normal tone BUE/BLE Psychiatric: grossly normal mood and affect, speech fluent and appropriate Neurologic: grossly non-focal.          Labs on Admission:  Basic Metabolic Panel:  Recent Labs Lab 12/29/13 1724  NA 144  K 2.5*  CL 104    CO2 33*  GLUCOSE 86  BUN 15  CREATININE 0.93  CALCIUM 8.7   Liver Function Tests:  Recent Labs Lab 12/29/13 1724  AST 123*  ALT 48*  ALKPHOS 143*  BILITOT 0.8  PROT 6.7  ALBUMIN 2.4*    Recent Labs Lab 12/29/13 1724  LIPASE 37    Recent Labs Lab 12/29/13 1724  AMMONIA 83*   CBC:  Recent Labs Lab 12/29/13 1724  WBC 2.4*  HGB 10.7*  HCT 31.9*  MCV 90.1  PLT 58*   Cardiac Enzymes: No results found for this basename: CKTOTAL, CKMB, CKMBINDEX, TROPONINI,  in the last 168 hours  BNP (last 3 results) No results found for this basename: PROBNP,  in the last 8760 hours CBG: No results found for this basename: GLUCAP,  in the last 168 hours  Radiological Exams on Admission: No results found.   Assessment/Plan Principal Problem:   Cellulitis and abscess of buttock Active Problems:   TOBACCO USE   SUBSTANCE ABUSE, MULTIPLE   HYPERTENSION, BENIGN ESSENTIAL   COPD   Cancer, hepatocellular   Liver cirrhosis   1. Cellulitis of the Buttocks and leg with history of MRSA -will start on vancocin and also zosyn now -will swab for MRSA -monitor blood cultures  2. Liver Cirrhosis -will get GI involvement -ammonia level elevated will start lactulose -will continue with diuretics as she does have some ascites and evidence of portal hypertension  3. Hepatocellular carcinoma -per oncology  4. COPD -will continue with oxygen -continue with nebs -continue with symbicort -will review CXR  5. Hypertension -continue with home medications  6. Tobacco Use -smoking cessation strongly suggested  7. Polysubstance use -will check drug screen  8. 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 Hypokalemia -will replete potassium   Code Status: Full Code (must indicate code status--if unknown or must be presumed, indicate so) Family Communication: daughters and husband (indicate person spoken with, if applicable, with  phone number if by telephone) Disposition Plan: Home (indicate anticipated LOS)  Time spent: 50min  Mario Coronado A [facility] Pager [phone]  **Disclaimer: This note may have been dictated with voice recognition software. Similar sounding words can inadvertently be transcribed and this note may contain transcription errors which may not have been corrected upon publication of note.**

## 2013-12-29 NOTE — Progress Notes (Signed)
ANTIBIOTIC CONSULT NOTE - INITIAL  Pharmacy Consult for Vancomycin and Zosyn Indication: Cellulitis  Allergies  Allergen Reactions  . Tylenol [Acetaminophen]     Liver issues    Patient Measurements:   As of 12/23/13: 70.3 kg, 65 inches  Vital Signs: Temp: 98.5 F (36.9 C) (06/11 2141) Temp src: Oral (06/11 2141) BP: 104/70 mmHg (06/11 2141) Pulse Rate: 69 (06/11 2141) Intake/Output from previous day:   Intake/Output from this shift:    Labs:  Recent Labs  12/29/13 1724  WBC 2.4*  HGB 10.7*  PLT 58*  CREATININE 0.93   The CrCl is unknown because both a height and weight (above a minimum accepted value) are required for this calculation. 74 ml/min/1.60m2 (normalized)  No results found for this basename: VANCOTROUGH, VANCOPEAK, VANCORANDOM, GENTTROUGH, GENTPEAK, GENTRANDOM, TOBRATROUGH, TOBRAPEAK, TOBRARND, AMIKACINPEAK, AMIKACINTROU, AMIKACIN,  in the last 72 hours   Microbiology: No results found for this or any previous visit (from the past 720 hour(s)).  Medical History: Past Medical History  Diagnosis Date  . Hypertension   . Bipolar 1 disorder   . Menorrhagia   . Post-menopausal bleeding     MOST RECENT BLEEDING  WAS NOV 2013  . Paresthesia   . COPD (chronic obstructive pulmonary disease)   . 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 Gout   . Mental disorder     bipolar  . Asthma   . Shortness of breath   . Anemia   . GERD (gastroesophageal reflux disease)   . Gallstones     ABD PAIN, N&V  . Arthritis   . Headache(784.0)   . Hepatitis     Hep C x 10 yrs  - NO TREATMENT  . PONV (postoperative nausea and vomiting)   . Cirrhosis     Medications:  Scheduled:  . budesonide-formoterol  2 puff Inhalation BID  . colchicine  0.6 mg Oral BID  . divalproex  250 mg Oral BID  . docusate sodium  100 mg Oral BID  . folic acid  1 mg Oral Daily  . [START ON 12/30/2013] furosemide  20 mg Oral Daily  . gabapentin  300 mg Oral TID  .  hydrochlorothiazide  25 mg Oral QHS  . ipratropium-albuterol  3 mL Nebulization Q4H  . lactulose  10 g Oral BID  . multivitamin with minerals  1 tablet Oral Daily  . [START ON 12/30/2013] pantoprazole  40 mg Oral Daily  . piperacillin-tazobactam (ZOSYN)  IV  3.375 g Intravenous Q8H  . potassium chloride  40 mEq Oral BID  . pregabalin  75 mg Oral BID  . thiamine  100 mg Oral Daily  . vancomycin  1,000 mg Intravenous Once  . zolpidem  5 mg Oral QHS   Infusions:   PRN: alum & mag hydroxide-simeth, bisacodyl, clonazePAM, ondansetron (ZOFRAN) IV, ondansetron, oxyCODONE, senna-docusate, sodium phosphate  Assessment: 58 yo female with polysubstance abuse, hepatitis C and recently diagnosed HCC. Admitted 6/11 with boils on her legs, stated history of MRSA. Pharmacy is consulted to dose vancomycin and zosyn for cellulitis.  Anti-infectives: 6/11 >> Vanc >> 6/11 >> Zosyn >>  Labs / Vitals: Tmax: AFebrile WBCs: 2.4k Renal: CrCl 74 ml/min/1.68m2 (normalized)  Microbiology: 6/11 blood x2: sent 6/11 MRSA PCR: sent  Goal of Therapy:  Vancomycin trough level 10-15 mcg/ml Zosyn dose per renal function  Plan:   Vancomycin 1g IV x 1, then 750mg  IV q12h Check trough at steady state Zosyn 3.375gm IV q8h (  4hr extended infusions) Follow up renal function & cultures, clinical course  Peggyann Juba, PharmD, BCPS Pager: [phone] 12/29/2013,9:49 PM

## 2013-12-30 ENCOUNTER — Inpatient Hospital Stay (HOSPITAL_COMMUNITY): Payer: Medicare Other

## 2013-12-30 DIAGNOSIS — D638 Anemia in other chronic diseases classified elsewhere: Secondary | ICD-10-CM

## 2013-12-30 LAB — PROTIME-INR
INR: 1.28 (ref 0.00–1.49)
Prothrombin Time: 15.7 seconds — ABNORMAL HIGH (ref 11.6–15.2)

## 2013-12-30 LAB — GLUCOSE, CAPILLARY: Glucose-Capillary: 106 mg/dL — ABNORMAL HIGH (ref 70–99)

## 2013-12-30 LAB — COMPREHENSIVE METABOLIC PANEL
ALT: 42 U/L — AB (ref 0–35)
AST: 107 U/L — AB (ref 0–37)
Albumin: 2.2 g/dL — ABNORMAL LOW (ref 3.5–5.2)
Alkaline Phosphatase: 130 U/L — ABNORMAL HIGH (ref 39–117)
BILIRUBIN TOTAL: 0.7 mg/dL (ref 0.3–1.2)
BUN: 12 mg/dL (ref 6–23)
CALCIUM: 8.5 mg/dL (ref 8.4–10.5)
CHLORIDE: 107 meq/L (ref 96–112)
CO2: 31 mEq/L (ref 19–32)
Creatinine, Ser: 0.98 mg/dL (ref 0.50–1.10)
GFR calc Af Amer: 72 mL/min — ABNORMAL LOW (ref >90)
GFR calc non Af Amer: 62 mL/min — ABNORMAL LOW (ref >90)
Glucose, Bld: 91 mg/dL (ref 70–99)
Potassium: 2.8 mEq/L — CL (ref 3.7–5.3)
SODIUM: 147 meq/L (ref 137–147)
Total Protein: 6.2 g/dL (ref 6.0–8.3)

## 2013-12-30 LAB — CBC
HCT: 30.3 % — ABNORMAL LOW (ref 36.0–46.0)
Hemoglobin: 9.8 g/dL — ABNORMAL LOW (ref 12.0–15.0)
MCH: 29.7 pg (ref 26.0–34.0)
MCHC: 32.3 g/dL (ref 30.0–36.0)
MCV: 91.8 fL (ref 78.0–100.0)
PLATELETS: 50 10*3/uL — AB (ref 150–400)
RBC: 3.3 MIL/uL — AB (ref 3.87–5.11)
RDW: 15.7 % — ABNORMAL HIGH (ref 11.5–15.5)
WBC: 2.3 10*3/uL — AB (ref 4.0–10.5)

## 2013-12-30 LAB — HEMOGLOBIN A1C
HEMOGLOBIN A1C: 4.9 % (ref <5.7)
MEAN PLASMA GLUCOSE: 94 mg/dL (ref <117)

## 2013-12-30 LAB — TSH: TSH: 0.242 u[IU]/mL — AB (ref 0.350–4.500)

## 2013-12-30 LAB — APTT: aPTT: 26 seconds (ref 24–37)

## 2013-12-30 LAB — MAGNESIUM: Magnesium: 1.9 mg/dL (ref 1.5–2.5)

## 2013-12-30 MED ORDER — BIOTENE DRY MOUTH MT LIQD
15.0000 mL | Freq: Two times a day (BID) | OROMUCOSAL | Status: DC
  Administered 2013-12-30 – 2014-01-02 (×4): 15 mL via OROMUCOSAL

## 2013-12-30 MED ORDER — ENSURE PUDDING PO PUDG
1.0000 | Freq: Three times a day (TID) | ORAL | Status: DC
  Administered 2013-12-31 – 2014-01-03 (×5): 1 via ORAL
  Filled 2013-12-30 (×13): qty 1

## 2013-12-30 MED ORDER — POTASSIUM CHLORIDE CRYS ER 20 MEQ PO TBCR
20.0000 meq | EXTENDED_RELEASE_TABLET | Freq: Two times a day (BID) | ORAL | Status: DC
  Filled 2013-12-30 (×2): qty 1

## 2013-12-30 MED ORDER — POTASSIUM CHLORIDE 10 MEQ/100ML IV SOLN
10.0000 meq | INTRAVENOUS | Status: AC
  Administered 2013-12-30 (×3): 10 meq via INTRAVENOUS
  Filled 2013-12-30 (×3): qty 100

## 2013-12-30 MED ORDER — IPRATROPIUM-ALBUTEROL 0.5-2.5 (3) MG/3ML IN SOLN
3.0000 mL | Freq: Two times a day (BID) | RESPIRATORY_TRACT | Status: DC
  Administered 2013-12-30 – 2014-01-01 (×5): 3 mL via RESPIRATORY_TRACT
  Filled 2013-12-30 (×5): qty 3

## 2013-12-30 MED ORDER — LORAZEPAM 2 MG/ML IJ SOLN
1.0000 mg | Freq: Four times a day (QID) | INTRAMUSCULAR | Status: DC | PRN
  Administered 2013-12-30 (×2): 1 mg via INTRAVENOUS
  Filled 2013-12-30 (×2): qty 1

## 2013-12-30 MED ORDER — NICOTINE 14 MG/24HR TD PT24
14.0000 mg | MEDICATED_PATCH | Freq: Every day | TRANSDERMAL | Status: DC
  Administered 2013-12-30 – 2014-01-03 (×5): 14 mg via TRANSDERMAL
  Filled 2013-12-30 (×5): qty 1

## 2013-12-30 MED ORDER — IOHEXOL 300 MG/ML  SOLN
100.0000 mL | Freq: Once | INTRAMUSCULAR | Status: AC | PRN
  Administered 2013-12-30: 100 mL via INTRAVENOUS

## 2013-12-30 NOTE — Consult Note (Signed)
WOC wound consult note Reason for Consult:Chronic ulcerations on right posterior thigh and left lateral hip Wound type:infectious Pressure Ulcer POA: No Measurement:right posterior thigh:  2.5cm round x 0.4cm deep. 75% necrotic tissue and 25% red, moist tissue. Marginal periwound erythema measuring 0.5cm.  Left lateral hip:  3cm x 1.5cm x 0 depth, but area is necrotic and presents with 50% white/50% black thin eschar in a surrounding area of erythema, induration and warmth measuring 6cm x 5cm Wound bed:As described above Drainage (amount, consistency, odor) No drainage from left hip, serosanguinous drainage from right posterior thigh Periwound:As described above. Dressing procedure/placement/frequency: I have initiated a saline dressing to the right posterior thigh with a silicone topper and a silicone topper dressing to the left lateral hip.  I suspect an infectious process vs trauma, albeit patient described both a history of "boils" as well as a recent heating pad thermal injury to these areas.  As she is a poor historian, I suggest a surgical ([facility]) consultation for further workup and assessment for need for surgical debridement. If you agree, please order. [facility] nursing team will not follow, but will remain available to this patient, the nursing and medical teams.  Please re-consult if needed. Thanks, Maudie Flakes, MSN, RN, [place], [place], [place] [phone])

## 2013-12-30 NOTE — Progress Notes (Addendum)
Patient ID: Esmeralda Links, female   DOB: 05-12-1956, 58 y.o.   MRN: [id number] [facility] PROGRESS NOTE  CORBYN STEEDMAN ZYS:[id number] DOB: 1956-05-05 DOA: 12/29/2013 PCP: Barbette Merino, MD  Brief narrative: 58 y.o. female with multiple medical problems including cirrhosis of the liver and liver cancer, hepatitis C, polysubstance use who presented to the [facility] ED 12/29/2013 with complaints of 2 ulcers each on lateral thigh with drainage. Per patient's daughters patient also had waxing and waning mental status changes. On admission, she was started on broad spectrum abx, vanco and zosyn. WOC consulted.   Assessment/Plan:  Principal Problem:   Cellulitis and abscess of lateral thigh area - will get WOC for assessment of those 2 ulcers. Order also placed for CT pelvis to evaluate the ulcer and possible abscess that may require drainage. - continue vanco and zosyn for now - follow up wound culture from ulcers obtained on admission  Active Problems:   HYPERTENSION, BENIGN ESSENTIAL - continue hydrochlorothiazide 25 mg daily   COPD - continue duoneb BID scheduled and symbicort inhaler BID - resp status stable    Hepatocellular carcinoma - will inform Dr. Alvy Bimler of patient's admission   Pancytopenia - secondary to bone marrow suppression for hep C, liver cirrhosis, liver ca - continue to monitor CBC daily    Hypokalemia - due to lasix - being repleted    Liver cirrhosis - continue lactulose, lasix, multivitamin, protonix    Gout - continue colchicine   Neuropathy - continue gabapentin and lyrica   H/O substance abuse - current pain meds: oxycodone PRN,  ativan PRN; avoid IV pain meds   Severe protein calorie malnutrition - Pt meets criteria for severe MALNUTRITION in the context of chronic illness as evidenced by reported wt loss of 10% in 19 days and severe fat and muscle wasting. - continue ensure supplementation    DVT prophylaxis: SCD's bilaterally due to  thrombocytopenia  Code Status: full code  Family Communication: plan of care discussed with the patient and her daughters at the bedside  Disposition Plan: home when stable   Leisa Lenz, MD  [facility] Pager [phone]  If 7PM-7AM, please contact night-coverage [email or web address] Password [facility] 12/30/2013, 8:19 AM   LOS: 1 day   Consultants:  WOC  Procedures:  None   Antibiotics:  Vanco 12/29/2013 -->  Zosyn 12/29/2013 -->  HPI/Subjective: No acute overnight events.  Objective: Filed Vitals:   12/29/13 2029 12/29/13 2141 12/29/13 2333 12/30/13 0620  BP: 110/79 104/70  103/68  Pulse: 70 69  65  Temp: 98 F (36.7 C) 98.5 F (36.9 C)  98.7 F (37.1 C)  TempSrc: Oral Oral  Oral  Resp: 16 16  16   Height:  5\' 5"  (1.651 m)    Weight:  70.762 kg (156 lb)    SpO2: 100% 98% 99% 95%    Intake/Output Summary (Last 24 hours) at 12/30/13 0819 Last data filed at 12/30/13 3220  Gross per 24 hour  Intake    200 ml  Output    775 ml  Net   -575 ml    Exam:   General:  Pt is alert, not in acute distress  Cardiovascular: Regular rate and rhythm, S1/S2, no murmurs  Respiratory: Clear to auscultation bilaterally, no wheezing  Abdomen: Soft, non tender, non distended, bowel sounds present  Extremities: No edema, pulses DP and PT palpable bilaterally;   Neuro: Grossly nonfocal  Data Reviewed: Basic Metabolic Panel:  Recent Labs Lab 12/29/13 1724 12/30/13 0430  NA  144 147  K 2.5* 2.8*  CL 104 107  CO2 33* 31  GLUCOSE 86 91  BUN 15 12  CREATININE 0.93 0.98  CALCIUM 8.7 8.5  MG  --  1.9   Liver Function Tests:  Recent Labs Lab 12/29/13 1724 12/30/13 0430  AST 123* 107*  ALT 48* 42*  ALKPHOS 143* 130*  BILITOT 0.8 0.7  PROT 6.7 6.2  ALBUMIN 2.4* 2.2*    Recent Labs Lab 12/29/13 1724  LIPASE 37    Recent Labs Lab 12/29/13 1724  AMMONIA 83*   CBC:  Recent Labs Lab 12/29/13 1724 12/30/13 0430  WBC 2.4* 2.3*  HGB 10.7* 9.8*   HCT 31.9* 30.3*  MCV 90.1 91.8  PLT 58* 50*   Cardiac Enzymes: No results found for this basename: CKTOTAL, CKMB, CKMBINDEX, TROPONINI,  in the last 168 hours BNP: No components found with this basename: POCBNP,  CBG:  Recent Labs Lab 12/30/13 0703  GLUCAP 106*    No results found for this or any previous visit (from the past 240 hour(s)).   Studies: Dg Chest 1 View 12/30/2013     IMPRESSION: No active disease.  Stable chest.      Scheduled Meds: . budesonide-formoterol  2 puff Inhalation BID  . colchicine  0.6 mg Oral BID  . divalproex  250 mg Oral BID  . docusate sodium  100 mg Oral BID  . folic acid  1 mg Oral Daily  . furosemide  20 mg Oral Daily  . gabapentin  300 mg Oral TID  . hydrochlorothiazide  25 mg Oral QHS  . ipratropium-albuterol  3 mL Nebulization BID  . lactulose  10 g Oral BID  . multivitamin   1 tablet Oral Daily  . pantoprazole  40 mg Oral Daily  . piperacillin-tazobactam  3.375 g Intravenous Q8H  . potassium chloride  40 mEq Oral BID  . pregabalin  75 mg Oral BID  . thiamine  100 mg Oral Daily  . vancomycin  750 mg Intravenous Q12H  . zolpidem  5 mg Oral QHS

## 2013-12-30 NOTE — Progress Notes (Signed)
INITIAL NUTRITION ASSESSMENT  DOCUMENTATION CODES Per approved criteria  -Severe malnutrition in the context of chronic illness  Pt meets criteria for severe MALNUTRITION in the context of chronic illness as evidenced by reported wt loss of 10% in 19 days and severe fat and muscle wasting.  INTERVENTION: -Ensure Complete po TID, each supplement provides 350 kcal and 13 grams of protein -RD to continue to follow for nutrition care plan.  NUTRITION DIAGNOSIS: Increased nutrient needs related to liver cirrhosis as evidenced by wt loss.   Goal: Pt to meet >/= 90% of their estimated nutrition needs  Monitor:  Wt, po intake, acceptance and toleration of supplements, labs  Reason for Assessment: MST  58 y.o. female  Admitting Dx: Cellulitis and abscess of buttock  ASSESSMENT: 58 y.o. female with multiple medical problems including cirrhosis of the liver and liver cancer hepatitis C polysubstance use presents to the ED with complaints of boils on her legs. She states that she noticed the lesions a few days ago. She states that she has had some drainage noted. She also states that she has a history of MRSA in the past. She recently was diagnosed with liver cancer followed by oncology and not deemed a surgical candidate but was to start chemotherapy mainly as a palliative care measure.  - Pt reports a wt loss of 17 lbs in 15 days.  - Pt reports that she has been eating, but less than normal.  - Meal completion is recorded as 100% - Pt with cellulitis of buttocks.   Height: Ht Readings from Last 1 Encounters:  12/29/13 5\' 5"  (1.651 m)    Weight: Wt Readings from Last 1 Encounters:  12/29/13 156 lb (70.762 kg)    Ideal Body Weight: 57 kg  % Ideal Body Weight: 124%  Wt Readings from Last 10 Encounters:  12/29/13 156 lb (70.762 kg)  12/23/13 154 lb 14.4 oz (70.262 kg)  12/22/13 156 lb (70.761 kg)  12/16/13 156 lb 14.4 oz (71.169 kg)  12/07/13 155 lb 1.6 oz (70.353 kg)   08/18/13 167 lb (75.751 kg)  12/22/12 160 lb (72.576 kg)  09/03/12 162 lb 3.2 oz (73.573 kg)  08/16/12 165 lb (74.844 kg)  08/05/12 161 lb 9.6 oz (73.301 kg)    Usual Body Weight: 169 lbs  % Usual Body Weight: 92%  BMI:  Body mass index is 25.96 kg/(m^2).  Estimated Nutritional Needs: Kcal: 6433-2951 Protein: 70-80 g Fluid: 1.9-2.2 L/day  Skin: incision on left hip  Diet Order: General  EDUCATION NEEDS: -Education needs addressed   Intake/Output Summary (Last 24 hours) at 12/30/13 1603 Last data filed at 12/30/13 1400  Gross per 24 hour  Intake    780 ml  Output   1100 ml  Net   -320 ml    Last BM: 6/12   Labs:   Recent Labs Lab 12/29/13 1724 12/30/13 0430  NA 144 147  K 2.5* 2.8*  CL 104 107  CO2 33* 31  BUN 15 12  CREATININE 0.93 0.98  CALCIUM 8.7 8.5  MG  --  1.9  GLUCOSE 86 91    CBG (last 3)   Recent Labs  12/30/13 0703  GLUCAP 106*    Scheduled Meds: . antiseptic oral rinse  15 mL Mouth Rinse BID  . budesonide-formoterol  2 puff Inhalation BID  . colchicine  0.6 mg Oral BID  . divalproex  250 mg Oral BID  . docusate sodium  100 mg Oral BID  . folic acid  1 mg Oral Daily  . furosemide  20 mg Oral Daily  . gabapentin  300 mg Oral TID  . hydrochlorothiazide  25 mg Oral QHS  . ipratropium-albuterol  3 mL Nebulization BID  . lactulose  10 g Oral BID  . multivitamin with minerals  1 tablet Oral Daily  . nicotine  14 mg Transdermal Daily  . pantoprazole  40 mg Oral Daily  . piperacillin-tazobactam (ZOSYN)  IV  3.375 g Intravenous Q8H  . pregabalin  75 mg Oral BID  . thiamine  100 mg Oral Daily  . vancomycin  750 mg Intravenous Q12H  . zolpidem  5 mg Oral QHS    Continuous Infusions:   Past Medical History  Diagnosis Date  . Hypertension   . Bipolar 1 disorder   . Menorrhagia   . Post-menopausal bleeding     MOST RECENT BLEEDING  WAS NOV 2013  . Paresthesia   . COPD (chronic obstructive pulmonary disease)   . H/O:  substance abuse     IVDU cocaine last 1990  . Gout   . Mental disorder     bipolar  . Asthma   . Shortness of breath   . Anemia   . GERD (gastroesophageal reflux disease)   . Gallstones     ABD PAIN, N&V  . Arthritis   . Headache(784.0)   . Hepatitis     Hep C x 10 yrs  - NO TREATMENT  . PONV (postoperative nausea and vomiting)   . Cirrhosis     Past Surgical History  Procedure Laterality Date  . Tubal ligation    . Facial reconstruction surgery    . Multiple tooth extractions    . Hysteroscopy w/d&c  09/29/2011    Procedure: DILATATION AND CURETTAGE /HYSTEROSCOPY;  Surgeon: Mora Bellman, MD;  Location: [facility];  Service: Gynecology;  Laterality: N/A;  . Cholecystectomy  08/20/2012    Procedure: LAPAROSCOPIC CHOLECYSTECTOMY;  Surgeon: Ralene Ok, MD;  Location: [facility];  Service: General;;    Terrace Arabia RD, LDN

## 2013-12-30 NOTE — Progress Notes (Signed)
CRITICAL VALUE ALERT  Critical value received:  K 2.8  Date of notification:  12-30-13   Time of notification:  0547  Critical value read back:yes  Nurse who received alert:  Kelby Fam  MD notified (1st page):  Kathline Magic NP  Time of first page:  [phone]  MD notified (2nd page):  Time of second page:  Responding MD: Kathline Magic NP  Time MD responded:  [phone]

## 2013-12-30 NOTE — Progress Notes (Signed)
Dorothy Burns Magic text paged the critical K level of 2.8.
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
## 2013-12-31 DIAGNOSIS — L89209 Pressure ulcer of unspecified hip, unspecified stage: Secondary | ICD-10-CM

## 2013-12-31 DIAGNOSIS — F319 Bipolar disorder, unspecified: Secondary | ICD-10-CM

## 2013-12-31 DIAGNOSIS — L97109 Non-pressure chronic ulcer of unspecified thigh with unspecified severity: Secondary | ICD-10-CM

## 2013-12-31 LAB — GLUCOSE, CAPILLARY: GLUCOSE-CAPILLARY: 115 mg/dL — AB (ref 70–99)

## 2013-12-31 LAB — CBC
HCT: 28.4 % — ABNORMAL LOW (ref 36.0–46.0)
Hemoglobin: 9.7 g/dL — ABNORMAL LOW (ref 12.0–15.0)
MCH: 31.7 pg (ref 26.0–34.0)
MCHC: 34.2 g/dL (ref 30.0–36.0)
MCV: 92.8 fL (ref 78.0–100.0)
Platelets: 48 10*3/uL — ABNORMAL LOW (ref 150–400)
RBC: 3.06 MIL/uL — ABNORMAL LOW (ref 3.87–5.11)
RDW: 16.1 % — AB (ref 11.5–15.5)
WBC: 1.9 10*3/uL — ABNORMAL LOW (ref 4.0–10.5)

## 2013-12-31 LAB — BASIC METABOLIC PANEL
BUN: 8 mg/dL (ref 6–23)
CALCIUM: 8.6 mg/dL (ref 8.4–10.5)
CHLORIDE: 105 meq/L (ref 96–112)
CO2: 29 mEq/L (ref 19–32)
Creatinine, Ser: 0.87 mg/dL (ref 0.50–1.10)
GFR calc non Af Amer: 72 mL/min — ABNORMAL LOW (ref >90)
GFR, EST AFRICAN AMERICAN: 83 mL/min — AB (ref >90)
Glucose, Bld: 167 mg/dL — ABNORMAL HIGH (ref 70–99)
Potassium: 2.9 mEq/L — CL (ref 3.7–5.3)
Sodium: 141 mEq/L (ref 137–147)

## 2013-12-31 LAB — MRSA CULTURE

## 2013-12-31 MED ORDER — POTASSIUM CHLORIDE 10 MEQ/100ML IV SOLN
10.0000 meq | INTRAVENOUS | Status: DC
  Administered 2013-12-31: 10 meq via INTRAVENOUS
  Filled 2013-12-31 (×3): qty 100

## 2013-12-31 MED ORDER — SODIUM CHLORIDE 0.9 % IV SOLN
INTRAVENOUS | Status: AC
  Administered 2013-12-31: 10:00:00 via INTRAVENOUS
  Filled 2013-12-31 (×2): qty 1000

## 2013-12-31 MED ORDER — SILVER SULFADIAZINE 1 % EX CREA
TOPICAL_CREAM | Freq: Two times a day (BID) | CUTANEOUS | Status: DC
  Administered 2013-12-31 (×2): via TOPICAL
  Filled 2013-12-31: qty 50

## 2013-12-31 MED ORDER — SILVER SULFADIAZINE 1 % EX CREA
TOPICAL_CREAM | Freq: Two times a day (BID) | CUTANEOUS | Status: DC
  Administered 2013-12-31 – 2014-01-03 (×3): via TOPICAL
  Filled 2013-12-31: qty 85

## 2013-12-31 NOTE — Progress Notes (Signed)
CRITICAL VALUE ALERT  Critical value received:  K+ - 2.9  Date of notification:  12/31/2013  Time of notification:  0615  Critical value read back:YES  Nurse who received alert:  Tor Netters, RN  MD notified (1st page):  Fredirick Maudlin, NP  Time of first page:  0622  MD notified (2nd page):  Time of second page:  Responding MD:  Fredirick Maudlin, NP  Time MD responded:  [phone]

## 2013-12-31 NOTE — Progress Notes (Signed)
Patient ID: Dorothy Burns, female   DOB: Nov 20, 1955, 58 y.o.   MRN: [id number] [facility] PROGRESS NOTE  Dorothy Burns LAG:[id number] DOB: 11-Nov-1955 DOA: 12/29/2013 PCP: Barbette Merino, MD  Brief narrative: 58 y.o. female with multiple medical problems including cirrhosis of the liver and liver cancer, hepatitis C, polysubstance use who presented to the [facility] ED 12/29/2013 with complaints of 2 ulcers each on lateral thigh with drainage. Per patient's daughters patient also had waxing and waning mental status changes. On admission, she was started on broad spectrum abx, vanco and zosyn. WOC and surgery consulted for input on management.   Assessment/Plan:   Principal Problem:  Cellulitis and abscess of lateral thigh area   Appreciate very much WOC recommendations. Assessment of the wounds: "Right posterior thigh: 2.5cm round x 0.4cm deep. 75% necrotic tissue and 25% red, moist tissue. Marginal periwound erythema measuring 0.5cm. Left lateral hip: 3cm x 1.5cm x 0 depth, but area is necrotic and presents with 50% white/50% black thin eschar in a surrounding area of erythema, induration and warmth measuring 6cm x 5cm"  Dressing recommendations: saline dressing to the right posterior thigh with a silicone topper and a silicone topper dressing to the left lateral hip.  Appreciate surgery consult and recommendations which is to continue current broad spectrum antibiotics; I&D not required at this time due to other co-morbid medical issues including but not limited to thrombocytopenia  CT pelvis did not reveal evidence of abscess in the region of 2 ulcers   Active Problems:  HYPERTENSION, BENIGN ESSENTIAL   Continue hydrochlorothiazide 25 mg daily; BP 125/60 COPD   Continue duoneb BID scheduled and symbicort inhaler BID  Hepatocellular carcinoma   Consulted Dr. Alvy Bimler; order in [facility] but will follow up Monday to make sure she knows pt is admitted  Pancytopenia   Secondary to bone marrow  suppression for hep C, liver cirrhosis, liver ca   Continue to monitor CBC daily  Hypokalemia   Due to lasix, supplemented  Liver cirrhosis   Continue lasix, multivitamin, protonix   Stop laxatives, stool softeners; she has had 13 episodes of diarrhea recorded in past 24 hours Gout   Continue colchicine  Neuropathy   Continue gabapentin and lyrica  H/O substance abuse   Current pain meds: oxycodone PRN, ativan PRN; avoid IV pain meds  Severe protein calorie malnutrition   Pt meets criteria for severe MALNUTRITION in the context of chronic illness as evidenced by reported wt loss of 10% in 19 days and severe fat and muscle wasting.   Continue ensure supplementation   DVT prophylaxis: SCD's bilaterally due to thrombocytopenia   Code Status: full code  Family Communication: plan of care discussed with the patient and her daughters at the bedside  Disposition Plan: home when stable   Consultants:  [facility] (Dr. Alvy Bimler, [place]) Procedures:  None  Antibiotics:  Vanco 12/29/2013 -->  Zosyn 12/29/2013 -->   Leisa Lenz, MD  [facility] Pager [phone]  If 7PM-7AM, please contact night-coverage [email or web address] Password [facility] 12/31/2013, 11:33 AM   LOS: 2 days    HPI/Subjective: No acute overnight events.  Objective: Filed Vitals:   12/30/13 2034 12/30/13 2156 12/31/13 0540 12/31/13 0833  BP:  125/81 125/60   Pulse:  91 75   Temp:  97 F (36.1 C) 98.6 F (37 C)   TempSrc:  Oral Oral   Resp:  18 18   Height:      Weight:      SpO2: 97% 96% 98%  99%    Intake/Output Summary (Last 24 hours) at 12/31/13 1133 Last data filed at 12/31/13 0600  Gross per 24 hour  Intake   1970 ml  Output   2675 ml  Net   -705 ml    Exam:   General:  Pt is alert, no acute distress  Cardiovascular: Regular rate and rhythm, S1/S2 appreciated   Respiratory: bilateral air entry, no wheezing  Abdomen: Soft, non tender, non distended, bowel sounds  present  Extremities: No edema, pulses DP and PT palpable bilaterally; 2 ulcer on both lateral thighs about an inch in diameter with some draining pus and blood.  Neuro: no focal neurologic deficits   Data Reviewed: Basic Metabolic Panel:  Recent Labs Lab 12/29/13 1724 12/30/13 0430 12/31/13 0508  NA 144 147 141  K 2.5* 2.8* 2.9*  CL 104 107 105  CO2 33* 31 29  GLUCOSE 86 91 167*  BUN 15 12 8   CREATININE 0.93 0.98 0.87  CALCIUM 8.7 8.5 8.6  MG  --  1.9  --    Liver Function Tests:  Recent Labs Lab 12/29/13 1724 12/30/13 0430  AST 123* 107*  ALT 48* 42*  ALKPHOS 143* 130*  BILITOT 0.8 0.7  PROT 6.7 6.2  ALBUMIN 2.4* 2.2*    Recent Labs Lab 12/29/13 1724  LIPASE 37    Recent Labs Lab 12/29/13 1724  AMMONIA 83*   CBC:  Recent Labs Lab 12/29/13 1724 12/30/13 0430 12/31/13 0508  WBC 2.4* 2.3* 1.9*  HGB 10.7* 9.8* 9.7*  HCT 31.9* 30.3* 28.4*  MCV 90.1 91.8 92.8  PLT 58* 50* 48*   Cardiac Enzymes: No results found for this basename: CKTOTAL, CKMB, CKMBINDEX, TROPONINI,  in the last 168 hours BNP: No components found with this basename: POCBNP,  CBG:  Recent Labs Lab 12/30/13 0703 12/31/13 0734  GLUCAP 106* 115*    No results found for this or any previous visit (from the past 240 hour(s)).   Studies: Dg Chest 1 View 12/30/2013   IMPRESSION: No active disease.  Stable chest.     Ct Pelvis W Contrast 12/31/2013    IMPRESSION: 1. No evidence of abscess. 2. Small focal ulceration at the posterior upper right thigh, with associated skin thickening. Minimal associated soft tissue inflammation seen. Additional ulcerations not well characterized on CT. 3. Trace free fluid within the pelvis, and mild diffuse mesenteric stranding at the lower abdomen, appear relatively stable and may reflect the patient's hepatic cirrhosis.      Scheduled Meds: . budesonide-formoterol  2 puff Inhalation BID  . colchicine  0.6 mg Oral BID  . divalproex  250 mg Oral  BID  . feeding supplement  1 Container Oral TID BM  . folic acid  1 mg Oral Daily  . furosemide  20 mg Oral Daily  . gabapentin  300 mg Oral TID  . ipratropium-albuterol  3 mL Nebulization BID  . multivitamin  1 tablet Oral Daily  . nicotine  14 mg Transdermal Daily  . pantoprazole  40 mg Oral Daily  . piperacillin-tazobactam   3.375 g Intravenous Q8H  . pregabalin  75 mg Oral BID  . silver sulfADIAZINE   Topical BID  . silver sulfADIAZINE   Topical BID  . thiamine  100 mg Oral Daily  . vancomycin  750 mg Intravenous Q12H  . zolpidem  5 mg Oral QHS   Continuous Infusions: . sodium chloride 0.9 % 1,000 mL with potassium chloride 40 mEq infusion 75 mL/hr at  12/31/13 1008         

## 2013-12-31 NOTE — Consult Note (Signed)
Reason for Consult:skin breakdown Referring Physician: Dr. Knute Neu is an 58 y.o. female.  HPI: The pt is a 58 yo wf who has hepatocellular carcinoma and cirrhosis of her liver. She states she recently used a heating pad on her legs and hip and developed some blisters. She states this has happened before and cleared up with silvadene. She denies pain at these areas. She denies fever or chill.  Past Medical History  Diagnosis Date  . Hypertension   . Bipolar 1 disorder   . Menorrhagia   . Post-menopausal bleeding     MOST RECENT BLEEDING  WAS NOV 2013  . Paresthesia   . COPD (chronic obstructive pulmonary disease)   . 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 Gout   . Mental disorder     bipolar  . Asthma   . Shortness of breath   . Anemia   . GERD (gastroesophageal reflux disease)   . Gallstones     ABD PAIN, N&V  . Arthritis   . Headache(784.0)   . Hepatitis     Hep C x 10 yrs  - NO TREATMENT  . PONV (postoperative nausea and vomiting)   . Cirrhosis     Past Surgical History  Procedure Laterality Date  . Tubal ligation    . Facial reconstruction surgery    . Multiple tooth extractions    . Hysteroscopy w/d&c  09/29/2011    Procedure: DILATATION AND CURETTAGE /HYSTEROSCOPY;  Surgeon: Mora Bellman, MD;  Location: [facility];  Service: Gynecology;  Laterality: N/A;  . Cholecystectomy  08/20/2012    Procedure: LAPAROSCOPIC CHOLECYSTECTOMY;  Surgeon: Ralene Ok, MD;  Location: [facility];  Service: General;;    Family History  Problem Relation Age of Onset  . COPD Mother   . Diabetes Mother   . Heart disease Mother   . Diabetes Father   . Heart disease Father   . Hypertension Father   . Cancer Father     basal cell ca and prostate ca    Social History:  reports that she has been smoking Cigarettes.  She has a 7.5 pack-year smoking history. She has never used smokeless tobacco. She reports that she does not drink alcohol or use illicit  drugs.  Allergies:  Allergies  Allergen Reactions  . Tylenol [Acetaminophen]     Liver issues    Medications: I have reviewed the patient's current medications.  Results for orders placed during the hospital encounter of 12/29/13 (from the past 48 hour(s))  CBC     Status: Abnormal   Collection Time    12/29/13  5:24 PM      Result Value Ref Range   WBC 2.4 (*) 4.0 - 10.5 K/uL   RBC 3.54 (*) 3.87 - 5.11 MIL/uL   Hemoglobin 10.7 (*) 12.0 - 15.0 g/dL   HCT 31.9 (*) 36.0 - 46.0 %   MCV 90.1  78.0 - 100.0 fL   MCH 30.2  26.0 - 34.0 pg   MCHC 33.5  30.0 - 36.0 g/dL   RDW 15.5  11.5 - 15.5 %   Platelets 58 (*) 150 - 400 K/uL   Comment: SPECIMEN CHECKED FOR CLOTS     REPEATED TO VERIFY     PLATELET COUNT CONFIRMED BY SMEAR  COMPREHENSIVE METABOLIC PANEL     Status: Abnormal   Collection Time    12/29/13  5:24 PM      Result Value Ref Range  Sodium 144  137 - 147 mEq/L   Potassium 2.5 (*) 3.7 - 5.3 mEq/L   Comment: CRITICAL RESULT CALLED TO, READ BACK BY AND VERIFIED WITH:     Alvy Beal RN 1812 12/29/13 A NAVARRO   Chloride 104  96 - 112 mEq/L   CO2 33 (*) 19 - 32 mEq/L   Glucose, Bld 86  70 - 99 mg/dL   BUN 15  6 - 23 mg/dL   Creatinine, Ser 0.93  0.50 - 1.10 mg/dL   Calcium 8.7  8.4 - 10.5 mg/dL   Total Protein 6.7  6.0 - 8.3 g/dL   Albumin 2.4 (*) 3.5 - 5.2 g/dL   AST 123 (*) 0 - 37 U/L   ALT 48 (*) 0 - 35 U/L   Alkaline Phosphatase 143 (*) 39 - 117 U/L   Total Bilirubin 0.8  0.3 - 1.2 mg/dL   GFR calc non Af Amer 66 (*) >90 mL/min   GFR calc Af Amer 77 (*) >90 mL/min   Comment: (NOTE)     The eGFR has been calculated using the CKD EPI equation.     This calculation has not been validated in all clinical situations.     eGFR's persistently <90 mL/min signify possible Chronic Kidney     Disease.  AMMONIA     Status: Abnormal   Collection Time    12/29/13  5:24 PM      Result Value Ref Range   Ammonia 83 (*) 11 - 60 umol/L  LIPASE, BLOOD     Status: None    Collection Time    12/29/13  5:24 PM      Result Value Ref Range   Lipase 37  11 - 59 U/L  ACETAMINOPHEN LEVEL     Status: None   Collection Time    12/29/13  5:24 PM      Result Value Ref Range   Acetaminophen (Tylenol), Serum <15.0  10 - 30 ug/mL   Comment:            THERAPEUTIC CONCENTRATIONS VARY     SIGNIFICANTLY. A RANGE OF 10-30     ug/mL MAY BE AN EFFECTIVE     CONCENTRATION FOR MANY PATIENTS.     HOWEVER, SOME ARE BEST TREATED     AT CONCENTRATIONS OUTSIDE THIS     RANGE.     ACETAMINOPHEN CONCENTRATIONS     >150 ug/mL AT 4 HOURS AFTER     INGESTION AND >50 ug/mL AT 12     HOURS AFTER INGESTION ARE     OFTEN ASSOCIATED WITH TOXIC     REACTIONS.  PROTIME-INR     Status: Abnormal   Collection Time    12/29/13  5:24 PM      Result Value Ref Range   Prothrombin Time 16.1 (*) 11.6 - 15.2 seconds   INR 1.32  0.00 - 1.49  URINE RAPID DRUG SCREEN (HOSP PERFORMED)     Status: None   Collection Time    12/29/13  7:18 PM      Result Value Ref Range   Opiates NONE DETECTED  NONE DETECTED   Cocaine NONE DETECTED  NONE DETECTED   Benzodiazepines NONE DETECTED  NONE DETECTED   Amphetamines NONE DETECTED  NONE DETECTED   Tetrahydrocannabinol NONE DETECTED  NONE DETECTED   Barbiturates NONE DETECTED  NONE DETECTED   Comment:            DRUG SCREEN FOR MEDICAL PURPOSES  ONLY.  IF CONFIRMATION IS NEEDED     FOR ANY PURPOSE, NOTIFY LAB     WITHIN 5 DAYS.                LOWEST DETECTABLE LIMITS     FOR URINE DRUG SCREEN     Drug Class       Cutoff (ng/mL)     Amphetamine      1000     Barbiturate      200     Benzodiazepine   206     Tricyclics       015     Opiates          300     Cocaine          300     THC              50  URINALYSIS, ROUTINE W REFLEX MICROSCOPIC     Status: Abnormal   Collection Time    12/29/13  7:18 PM      Result Value Ref Range   Color, Urine YELLOW  YELLOW   APPearance CLOUDY (*) CLEAR   Specific Gravity, Urine 1.006  1.005 - 1.030    pH 7.5  5.0 - 8.0   Glucose, UA NEGATIVE  NEGATIVE mg/dL   Hgb urine dipstick NEGATIVE  NEGATIVE   Bilirubin Urine NEGATIVE  NEGATIVE   Ketones, ur NEGATIVE  NEGATIVE mg/dL   Protein, ur NEGATIVE  NEGATIVE mg/dL   Urobilinogen, UA 1.0  0.0 - 1.0 mg/dL   Nitrite NEGATIVE  NEGATIVE   Leukocytes, UA NEGATIVE  NEGATIVE   Comment: MICROSCOPIC NOT DONE ON URINES WITH NEGATIVE PROTEIN, BLOOD, LEUKOCYTES, NITRITE, OR GLUCOSE <1000 mg/dL.  BLOOD GAS, ARTERIAL     Status: Abnormal   Collection Time    12/29/13  9:37 PM      Result Value Ref Range   pH, Arterial 7.494 (*) 7.350 - 7.450   pCO2 arterial 36.5  35.0 - 45.0 mmHg   pO2, Arterial 81.3  80.0 - 100.0 mmHg   Bicarbonate 27.8 (*) 20.0 - 24.0 mEq/L   TCO2 25.3  0 - 100 mmol/L   Acid-Base Excess 4.7 (*) 0.0 - 2.0 mmol/L   O2 Saturation 96.9     Patient temperature 37.0     Collection site RIGHT RADIAL     Drawn by [id number]     Sample type ARTERIAL DRAW     Allens test (pass/fail) PASS  PASS  TSH     Status: Abnormal   Collection Time    12/29/13  9:57 PM      Result Value Ref Range   TSH 0.242 (*) 0.350 - 4.500 uIU/mL   Comment: Performed at [facility] A1C     Status: None   Collection Time    12/29/13  9:57 PM      Result Value Ref Range   Hemoglobin A1C 4.9  <5.7 %   Comment: (NOTE)                                                                               According to the ADA Clinical Practice Recommendations  for 2011, when     HbA1c is used as a screening test:      >=6.5%   Diagnostic of Diabetes Mellitus               (if abnormal result is confirmed)     5.7-6.4%   Increased risk of developing Diabetes Mellitus     References:Diagnosis and Classification of Diabetes Mellitus,Diabetes     ONGE,9528,41(LKGMW 1):S62-S69 and Standards of Medical Care in             Diabetes - 2011,Diabetes NUUV,2536,64 (Suppl 1):S11-S61.   Mean Plasma Glucose 94  <117 mg/dL   Comment: Performed at [place] X 1 CARD TO LAB, STOOL     Status: None   Collection Time    12/29/13 10:54 PM      Result Value Ref Range   Fecal Occult Bld NEGATIVE  NEGATIVE  CBC     Status: Abnormal   Collection Time    12/30/13  4:30 AM      Result Value Ref Range   WBC 2.3 (*) 4.0 - 10.5 K/uL   RBC 3.30 (*) 3.87 - 5.11 MIL/uL   Hemoglobin 9.8 (*) 12.0 - 15.0 g/dL   HCT 30.3 (*) 36.0 - 46.0 %   MCV 91.8  78.0 - 100.0 fL   MCH 29.7  26.0 - 34.0 pg   MCHC 32.3  30.0 - 36.0 g/dL   RDW 15.7 (*) 11.5 - 15.5 %   Platelets 50 (*) 150 - 400 K/uL   Comment: SPECIMEN CHECKED FOR CLOTS     CONSISTENT WITH PREVIOUS RESULT  COMPREHENSIVE METABOLIC PANEL     Status: Abnormal   Collection Time    12/30/13  4:30 AM      Result Value Ref Range   Sodium 147  137 - 147 mEq/L   Potassium 2.8 (*) 3.7 - 5.3 mEq/L   Comment: CRITICAL RESULT CALLED TO, READ BACK BY AND VERIFIED WITH:     D.JAMES RN AT 0543 ON [id number] BY C.BONGEL   Chloride 107  96 - 112 mEq/L   CO2 31  19 - 32 mEq/L   Glucose, Bld 91  70 - 99 mg/dL   BUN 12  6 - 23 mg/dL   Creatinine, Ser 0.98  0.50 - 1.10 mg/dL   Calcium 8.5  8.4 - 10.5 mg/dL   Total Protein 6.2  6.0 - 8.3 g/dL   Albumin 2.2 (*) 3.5 - 5.2 g/dL   AST 107 (*) 0 - 37 U/L   ALT 42 (*) 0 - 35 U/L   Alkaline Phosphatase 130 (*) 39 - 117 U/L   Total Bilirubin 0.7  0.3 - 1.2 mg/dL   GFR calc non Af Amer 62 (*) >90 mL/min   GFR calc Af Amer 72 (*) >90 mL/min   Comment: (NOTE)     The eGFR has been calculated using the CKD EPI equation.     This calculation has not been validated in all clinical situations.     eGFR's persistently <90 mL/min signify possible Chronic Kidney     Disease.  PROTIME-INR     Status: Abnormal   Collection Time    12/30/13  4:30 AM      Result Value Ref Range   Prothrombin Time 15.7 (*) 11.6 - 15.2 seconds   INR 1.28  0.00 - 1.49  APTT     Status: None   Collection Time    12/30/13  4:30 AM      Result Value Ref Range   aPTT 26  24 - 37 seconds   MAGNESIUM     Status: None   Collection Time    12/30/13  4:30 AM      Result Value Ref Range   Magnesium 1.9  1.5 - 2.5 mg/dL  GLUCOSE, CAPILLARY     Status: Abnormal   Collection Time    12/30/13  7:03 AM      Result Value Ref Range   Glucose-Capillary 106 (*) 70 - 99 mg/dL  CBC     Status: Abnormal   Collection Time    12/31/13  5:08 AM      Result Value Ref Range   WBC 1.9 (*) 4.0 - 10.5 K/uL   RBC 3.06 (*) 3.87 - 5.11 MIL/uL   Hemoglobin 9.7 (*) 12.0 - 15.0 g/dL   HCT 28.4 (*) 36.0 - 46.0 %   MCV 92.8  78.0 - 100.0 fL   MCH 31.7  26.0 - 34.0 pg   MCHC 34.2  30.0 - 36.0 g/dL   RDW 16.1 (*) 11.5 - 15.5 %   Platelets 48 (*) 150 - 400 K/uL   Comment: CONSISTENT WITH PREVIOUS RESULT  BASIC METABOLIC PANEL     Status: Abnormal   Collection Time    12/31/13  5:08 AM      Result Value Ref Range   Sodium 141  137 - 147 mEq/L   Potassium 2.9 (*) 3.7 - 5.3 mEq/L   Comment: CRITICAL RESULT CALLED TO, READ BACK BY AND VERIFIED WITH:     Ethelle Lyon AT 0617 ON 06.13.2015 BY NBROOKS   Chloride 105  96 - 112 mEq/L   CO2 29  19 - 32 mEq/L   Glucose, Bld 167 (*) 70 - 99 mg/dL   BUN 8  6 - 23 mg/dL   Creatinine, Ser 0.87  0.50 - 1.10 mg/dL   Calcium 8.6  8.4 - 10.5 mg/dL   GFR calc non Af Amer 72 (*) >90 mL/min   GFR calc Af Amer 83 (*) >90 mL/min   Comment: (NOTE)     The eGFR has been calculated using the CKD EPI equation.     This calculation has not been validated in all clinical situations.     eGFR's persistently <90 mL/min signify possible Chronic Kidney     Disease.  GLUCOSE, CAPILLARY     Status: Abnormal   Collection Time    12/31/13  7:34 AM      Result Value Ref Range   Glucose-Capillary 115 (*) 70 - 99 mg/dL    Dg Chest 1 View  12/30/2013   CLINICAL DATA:  Shortness of breath.  COPD.  EXAM: CHEST - 1 VIEW  COMPARISON:  12/01/2013.  FINDINGS: The heart size and mediastinal contours are within normal limits. Both lungs are clear. The visualized skeletal structures  show no acute abnormality.  IMPRESSION: No active disease.  Stable chest.   Electronically Signed   By: Rolla Flatten M.D.   On: 12/30/2013 01:50   Ct Pelvis W Contrast  12/31/2013   CLINICAL DATA:  Bilateral thigh ulcerations.  Assess for abscess.  EXAM: CT PELVIS WITH CONTRAST  TECHNIQUE: Multidetector CT imaging of the pelvis was performed using the standard protocol following the bolus administration of intravenous contrast.  CONTRAST:  132m OMNIPAQUE IOHEXOL 300 MG/ML  SOLN  COMPARISON:  CT of the abdomen performed 12/22/2013  FINDINGS: A small focal ulceration is  noted at the posterior upper right thigh, with associated skin thickening. No definite left-sided ulceration is identified. There is no evidence of underlying abscess. Only minimal associated soft tissue inflammation is seen.  Trace fluid within the pelvis, and mild diffuse mesenteric stranding within the lower abdomen, appear relatively stable from the prior study and may reflect the patient's hepatic cirrhosis. No significant small or large bowel abnormalities are identified. The uterus is grossly unremarkable. The ovaries are relatively symmetric. No suspicious adnexal masses are seen. No inguinal lymphadenopathy is appreciated.  The underlying gluteus musculature is unremarkable in appearance. The proximal thighs are otherwise grossly unremarkable in appearance. There is no evidence of vascular compromise. Minimal calcification is noted along the common iliac arteries bilaterally.  IMPRESSION: 1. No evidence of abscess. 2. Small focal ulceration at the posterior upper right thigh, with associated skin thickening. Minimal associated soft tissue inflammation seen. Additional ulcerations not well characterized on CT. 3. Trace free fluid within the pelvis, and mild diffuse mesenteric stranding at the lower abdomen, appear relatively stable and may reflect the patient's hepatic cirrhosis.   Electronically Signed   By: Garald Balding M.D.   On:  12/31/2013 02:53    Review of Systems  Constitutional: Negative.   HENT: Negative.   Eyes: Negative.   Respiratory: Negative.   Cardiovascular: Negative.   Gastrointestinal: Negative.   Genitourinary: Negative.   Musculoskeletal: Positive for joint pain.  Skin: Negative.   Neurological: Negative.   Endo/Heme/Allergies: Bruises/bleeds easily.  Psychiatric/Behavioral: Positive for substance abuse.   Blood pressure 125/60, pulse 75, temperature 98.6 F (37 C), temperature source Oral, resp. rate 18, height 5' 5" (1.651 m), weight 156 lb (70.762 kg), last menstrual period 05/21/2012, SpO2 99.00%. Physical Exam  Constitutional: She appears well-developed and well-nourished.  HENT:  Head: Normocephalic and atraumatic.  Eyes: Conjunctivae and EOM are normal. Pupils are equal, round, and reactive to light.  Neck: Normal range of motion. Neck supple.  Cardiovascular: Normal rate, regular rhythm and normal heart sounds.   Respiratory: Effort normal and breath sounds normal.  GI: Soft. Bowel sounds are normal.  Musculoskeletal: Normal range of motion.  Neurological: She is alert.  Seems to be confused at times  Skin: Skin is warm and dry.  Small opening on right posterior thigh with minimal redness and small amount of clearish drainage. Small area of ischemic skin on left lateral hip area with small amount of redness and no fluctuance  Psychiatric:  Confused at times    Assessment/Plan: The pt has a small open wound on posterior right thigh and some ischemic skin on left hip. I think it would be reasonable to treat with IV abx and topical silvadene. She is a poor surgical candidate given her thrombocytopenia and liver disease. We will follow  TOTH III,Sharron Simpson S 12/31/2013, 10:04 AM

## 2014-01-01 DIAGNOSIS — C228 Malignant neoplasm of liver, primary, unspecified as to type: Secondary | ICD-10-CM

## 2014-01-01 LAB — GLUCOSE, CAPILLARY: Glucose-Capillary: 82 mg/dL (ref 70–99)

## 2014-01-01 LAB — CBC
HCT: 27.6 % — ABNORMAL LOW (ref 36.0–46.0)
Hemoglobin: 8.9 g/dL — ABNORMAL LOW (ref 12.0–15.0)
MCH: 30.1 pg (ref 26.0–34.0)
MCHC: 32.2 g/dL (ref 30.0–36.0)
MCV: 93.2 fL (ref 78.0–100.0)
Platelets: 45 K/uL — ABNORMAL LOW (ref 150–400)
RBC: 2.96 MIL/uL — ABNORMAL LOW (ref 3.87–5.11)
RDW: 16.4 % — ABNORMAL HIGH (ref 11.5–15.5)
WBC: 1.7 K/uL — ABNORMAL LOW (ref 4.0–10.5)

## 2014-01-01 LAB — BASIC METABOLIC PANEL
BUN: 7 mg/dL (ref 6–23)
CO2: 31 mEq/L (ref 19–32)
Calcium: 8.4 mg/dL (ref 8.4–10.5)
Chloride: 105 mEq/L (ref 96–112)
Creatinine, Ser: 0.95 mg/dL (ref 0.50–1.10)
GFR, EST AFRICAN AMERICAN: 75 mL/min — AB (ref >90)
GFR, EST NON AFRICAN AMERICAN: 65 mL/min — AB (ref >90)
Glucose, Bld: 120 mg/dL — ABNORMAL HIGH (ref 70–99)
POTASSIUM: 3.3 meq/L — AB (ref 3.7–5.3)
SODIUM: 143 meq/L (ref 137–147)

## 2014-01-01 LAB — VANCOMYCIN, TROUGH: Vancomycin Tr: 11.3 ug/mL (ref 10.0–20.0)

## 2014-01-01 MED ORDER — SODIUM CHLORIDE 0.9 % IV SOLN
INTRAVENOUS | Status: AC
  Administered 2014-01-01: 09:00:00 via INTRAVENOUS
  Filled 2014-01-01: qty 1000

## 2014-01-01 MED ORDER — ALBUTEROL SULFATE (2.5 MG/3ML) 0.083% IN NEBU: 2.5000 mg | INHALATION_SOLUTION | Freq: Two times a day (BID) | RESPIRATORY_TRACT | Status: DC | PRN

## 2014-01-01 NOTE — Evaluation (Signed)
Physical Therapy Evaluation Patient Details Name: Dorothy Burns MRN: [id number] DOB: [birth year] Today's Date: 01/01/2014   History of Present Illness  58 yo female admitted with cellultis. hx of gout, Hep C, HTN, bipolar d/o, COPD, substance abuse, cirrhosis, new diagnosis of liver cancer. Pt lives alone  Clinical Impression  On eval, pt required Min assist for mobility-able to ambulate ~350 feet with rolling walker.     Follow Up Recommendations Home health PT;Supervision - Intermittent    Equipment Recommendations       Recommendations for Other Services OT consult     Precautions / Restrictions Precautions Precautions: Fall Restrictions Weight Bearing Restrictions: No      Mobility  Bed Mobility Overal bed mobility: Modified Independent                Transfers Overall transfer level: Needs assistance Equipment used: Rolling walker (2 wheeled) Transfers: Sit to/from Stand Sit to Stand: Supervision         General transfer comment: VCs hand placement.   Ambulation/Gait Ambulation/Gait assistance: Min assist Ambulation Distance (Feet): 350 Feet Assistive device: Rolling walker (2 wheeled) Gait Pattern/deviations: Trunk flexed;Step-through pattern;Decreased stride length     General Gait Details: assist to maneuver walker around objects in hallway. vcs safety, distance from walker. 1 standing rest break.   Stairs            Wheelchair Mobility    Modified Rankin (Stroke Patients Only)       Balance                                             Pertinent Vitals/Pain No c/o pain    Home Living Family/patient expects to be discharged to:: Private residence Living Arrangements: Alone   Type of Home: [place] Access: [place]: One [place]: [place] - 4 wheels;Bedside commode;Wheelchair - power      Prior Function Level of Independence: Independent with assistive device(s)                Hand Dominance        Extremity/Trunk Assessment   Upper Extremity Assessment: Overall WFL for tasks assessed           Lower Extremity Assessment: Generalized weakness      Cervical / Trunk Assessment: Kyphotic  Communication   Communication: No difficulties  Cognition Arousal/Alertness: Awake/alert Behavior During Therapy: WFL for tasks assessed/performed Overall Cognitive Status: Within Functional Limits for tasks assessed                      General Comments      Exercises        Assessment/Plan    PT Assessment Patient needs continued PT services  PT Diagnosis Difficulty walking;Generalized weakness   PT Problem List Decreased mobility;Decreased strength;Decreased activity tolerance;Decreased knowledge of use of DME  PT Treatment Interventions DME instruction;Gait training;Functional mobility training;Therapeutic activities;Therapeutic exercise;Patient/family education   PT Goals (Current goals can be found in the Care Plan section) Acute Rehab PT Goals Patient Stated Goal: home.  PT Goal Formulation: With patient Time For Goal Achievement: 01/15/14 Potential to Achieve Goals: Good    Frequency Min 3X/week   Barriers to discharge        Co-evaluation  End of Session   Activity Tolerance: Patient tolerated treatment well Patient left: in bed;with call bell/phone within reach           Time: 1109-1129 PT Time Calculation (min): 20 min   Charges:   PT Evaluation $Initial PT Evaluation Tier I: 1 Procedure PT Treatments $Gait Training: 8-22 mins   PT G Codes:          Weston Anna, MPT Pager: [phone]

## 2014-01-01 NOTE — Progress Notes (Addendum)
ANTIBIOTIC CONSULT NOTE - Follow-up  Pharmacy Consult for Vancomycin and Zosyn Indication: Cellulitis  Allergies  Allergen Reactions  . Tylenol [Acetaminophen]     Liver issues    Patient Measurements: Height: 5\' 5"  (165.1 cm) Weight: 156 lb (70.762 kg) IBW/kg (Calculated) : 57 As of 12/23/13: 70.3 kg, 65 inches  Vital Signs: Temp: 98 F (36.7 C) (06/14 0652) Temp src: Oral (06/14 0652) BP: 94/56 mmHg (06/14 0652) Pulse Rate: 73 (06/14 0652) Intake/Output from previous day: 06/13 0701 - 06/14 0700 In: 2430 [P.O.:240; I.V.:1490; IV Piggyback:700] Out: 2425 [Urine:2425] Intake/Output from this shift:    Labs:  Recent Labs  12/30/13 0430 12/31/13 0508 01/01/14 0504  WBC 2.3* 1.9* 1.7*  HGB 9.8* 9.7* 8.9*  PLT 50* 48* 45*  CREATININE 0.98 0.87 0.95   Estimated Creatinine Clearance: 63.7 ml/min (by C-G formula based on Cr of 0.95). 74 ml/min/1.78m2 (normalized)  No results found for this basename: VANCOTROUGH, VANCOPEAK, VANCORANDOM, GENTTROUGH, GENTPEAK, GENTRANDOM, TOBRATROUGH, TOBRAPEAK, TOBRARND, AMIKACINPEAK, AMIKACINTROU, AMIKACIN,  in the last 72 hours   Microbiology: Recent Results (from the past 720 hour(s))  CULTURE, BLOOD (ROUTINE X 2)     Status: None   Collection Time    12/29/13  9:37 PM      Result Value Ref Range Status   Specimen Description BLOOD L HAND   Final   Special Requests BOTTLES DRAWN AEROBIC ONLY 5ML   Final   Culture  Setup Time     Final   Value: 12/30/2013 00:54     Performed at [facility]   Culture     Final   Value:        BLOOD CULTURE RECEIVED NO GROWTH TO DATE CULTURE WILL BE HELD FOR 5 DAYS BEFORE ISSUING A FINAL NEGATIVE REPORT     Performed at [facility]   Report Status PENDING   Incomplete  MRSA CULTURE     Status: None   Collection Time    12/29/13  9:51 PM      Result Value Ref Range Status   Specimen Description ABSCESS   Final   Special Requests NONE   Final   Culture     Final   Value:  STAPHYLOCOCCUS AUREUS     Note: No MRSA Isolated     Performed at [facility]   Report Status 12/31/2013 FINAL   Final  CULTURE, BLOOD (ROUTINE X 2)     Status: None   Collection Time    12/29/13  9:54 PM      Result Value Ref Range Status   Specimen Description BLOOD LEFT ANTECUBITAL   Final   Special Requests BOTTLES DRAWN AEROBIC ONLY 5ML   Final   Culture  Setup Time     Final   Value: 12/30/2013 00:54     Performed at [facility]   Culture     Final   Value:        BLOOD CULTURE RECEIVED NO GROWTH TO DATE CULTURE WILL BE HELD FOR 5 DAYS BEFORE ISSUING A FINAL NEGATIVE REPORT     Performed at [facility]   Report Status PENDING   Incomplete    Medical History: Past Medical History  Diagnosis Date  . Hypertension   . Bipolar 1 disorder   . Menorrhagia   . Post-menopausal bleeding     MOST RECENT BLEEDING  WAS NOV 2013  . Paresthesia   . COPD (chronic obstructive pulmonary disease)   . H/O:  substance abuse     IVDU cocaine last 1990  . Gout   . Mental disorder     bipolar  . Asthma   . Shortness of breath   . Anemia   . GERD (gastroesophageal reflux disease)   . Gallstones     ABD PAIN, N&V  . Arthritis   . Headache(784.0)   . Hepatitis     Hep C x 10 yrs  - NO TREATMENT  . PONV (postoperative nausea and vomiting)   . Cirrhosis     Medications:  Scheduled:  . antiseptic oral rinse  15 mL Mouth Rinse BID  . budesonide-formoterol  2 puff Inhalation BID  . colchicine  0.6 mg Oral BID  . divalproex  250 mg Oral BID  . feeding supplement (ENSURE)  1 Container Oral TID BM  . folic acid  1 mg Oral Daily  . furosemide  20 mg Oral Daily  . gabapentin  300 mg Oral TID  . ipratropium-albuterol  3 mL Nebulization BID  . multivitamin with minerals  1 tablet Oral Daily  . nicotine  14 mg Transdermal Daily  . pantoprazole  40 mg Oral Daily  . piperacillin-tazobactam (ZOSYN)  IV  3.375 g Intravenous Q8H  . pregabalin  75 mg Oral BID  .  silver sulfADIAZINE   Topical BID  . silver sulfADIAZINE   Topical BID  . thiamine  100 mg Oral Daily  . vancomycin  750 mg Intravenous Q12H  . zolpidem  5 mg Oral QHS   Infusions:  . sodium chloride 0.9 % 1,000 mL with potassium chloride 40 mEq infusion 50 mL/hr at 01/01/14 0841   PRN: alum & mag hydroxide-simeth, clonazePAM, LORazepam, ondansetron (ZOFRAN) IV, ondansetron, oxyCODONE  Assessment: 58 yo female with polysubstance abuse, hepatitis C and recently diagnosed [name]. Admitted 6/11 with boils on her legs, stated history of MRSA. Pharmacy is consulted to dose vancomycin and zosyn for cellulitis.  Anti-infectives: 6/11 >> Vanc >> 6/11 >> Zosyn >>  Labs: Tmax: AF WBCs: low (etoh) Renal: SCr WNL, stable CrCl 68 CG  Cultures: 6/11 blood x2: ngtd 6/11 MRSA culture (abscess): MSSA F   Goal of Therapy:  Vancomycin trough level 10-15 mcg/ml Zosyn dose per renal function  Plan:   Continue Vancomycin 750mg  IV q12h Follow up on Vancomycin trough today at 1100 Zosyn 3.375gm IV q8h (4hr extended infusions) Follow up renal function & cultures, clinical course  Dorothy Burns, PharmD Pager: [phone] 01/01/2014 10:45 AM  Addendum: Drug levels 6/14 1140 Vancomycin Trough: 11.3 (Therapeutic)  Plan: Continue current Vancomycin dosing.  Dorothy Burns, PharmD Pager: [phone] 01/01/2014 12:55 PM

## 2014-01-01 NOTE — Progress Notes (Signed)
Patient ID: Dorothy Burns, female   DOB: Sep 07, 1955, 58 y.o.   MRN: [id number] [facility] PROGRESS NOTE  Dorothy Burns [id number]:[id number] DOB: 1955-07-24 DOA: 12/29/2013 PCP: Barbette Merino, MD  Brief narrative: 58 y.o. female with multiple medical problems including cirrhosis of the liver and liver cancer, hepatitis C, polysubstance use who presented to the [facility] ED 12/29/2013 with complaints of 2 ulcers each on lateral thigh with drainage. Per patient's daughters patient also had waxing and waning mental status changes. On admission, she was started on broad spectrum abx, vanco and zosyn. WOC and surgery consulted. Per recommendations, we will continue conservative treatment with abx, I&D not required at this time.  Assessment/Plan:   Principal Problem:  Cellulitis and abscess of lateral thigh area  Appreciate very much WOC recommendations. Assessment of the wounds: "Right posterior thigh: 2.5cm round x 0.4cm deep. 75% necrotic tissue and 25% red, moist tissue. Marginal periwound erythema measuring 0.5cm. Left lateral hip: 3cm x 1.5cm x 0 depth, but area is necrotic and presents with 50% white/50% black thin eschar in a surrounding area of erythema, induration and warmth measuring 6cm x 5cm"  Dressing recommendations: saline dressing to the right posterior thigh with a silicone topper and a silicone topper dressing to the left lateral hip.  Per surgery plan is to continue current broad spectrum antibiotics; I&D not required at this time due to other co-morbid medical issues including but not limited to thrombocytopenia  CT pelvis did not reveal evidence of abscess in the region of the 2 ulcers  Active Problems:  HYPERTENSION, BENIGN ESSENTIAL  BP soft this am so will stop Hctz COPD (chronic obstructive pulmonary disease) Continue duoneb BID scheduled and symbicort inhaler BID  Hepatocellular carcinoma  Consulted Dr. Alvy Bimler; will see with her what was the treatment plan in regards to  chemo Pancytopenia  Secondary to bone marrow suppression for hep C, liver cirrhosis, liver ca  No signs of bleed No current indications for PRBC or platelet transfusion  Hypokalemia  Due to lasix, supplemented  Liver cirrhosis  Continue lasix, multivitamin, protonix  Stopped laxatives, stool softeners due to multiple episodes of diarrhea; has had 13 BM 6/13 and now only 2 which is better and pt feels more comfortable Mental status good this am Gout  Continue colchicine  Neuropathy  Continue gabapentin and lyrica  H/O substance abuse  Current pain meds: oxycodone PRN, ativan PRN; avoid IV pain meds  Severe protein calorie malnutrition  Pt meets criteria for severe MALNUTRITION in the context of chronic illness as evidenced by reported wt loss of 10% in 19 days and severe fat and muscle wasting.  Continue ensure supplementation    DVT prophylaxis: SCD's bilaterally due to thrombocytopenia    Code Status: full code  Family Communication: plan of care discussed with the patient and her daughters at the bedside  Disposition Plan: home when stable   Consultants:  [place]  Surgery  Oncology (Dr. Alvy Bimler, [place]) Procedures:  None  Antibiotics:  Vanco 12/29/2013 -->  Zosyn 12/29/2013 -->  Leisa Lenz, MD  [facility] Pager [phone]  If 7PM-7AM, please contact night-coverage [email or web address] Password [facility] 01/01/2014, 6:53 AM   LOS: 3 days   HPI/Subjective: No acute overnight events.  Objective: Filed Vitals:   12/31/13 0833 12/31/13 1434 12/31/13 2040 12/31/13 2157  BP:  110/66  113/65  Pulse:  72  89  Temp:  97.5 F (36.4 C)  98.4 F (36.9 C)  TempSrc:  Oral  Oral  Resp:  18  16  Height:      Weight:      SpO2: 99% 100% 100% 99%    Intake/Output Summary (Last 24 hours) at 01/01/14 0254 Last data filed at 01/01/14 0600  Gross per 24 hour  Intake   2430 ml  Output   2025 ml  Net    405 ml    Exam:   General:  Pt is alert, follows commands appropriately,  not in acute distress  Cardiovascular: Regular rate and rhythm, S1/S2, no murmurs  Respiratory: Clear to auscultation bilaterally, no wheezing, no crackles, no rhonchi  Abdomen: Soft, non tender, non distended, bowel sounds present  Extremities: No edema, pulses DP and PT palpable bilaterally; lat thigh ulcers one on each side.  Neuro: Grossly nonfocal  Data Reviewed: Basic Metabolic Panel:  Recent Labs Lab 12/29/13 1724 12/30/13 0430 12/31/13 0508 01/01/14 0504  NA 144 147 141 143  K 2.5* 2.8* 2.9* 3.3*  CL 104 107 105 105  CO2 33* 31 29 31   GLUCOSE 86 91 167* 120*  BUN 15 12 8 7   CREATININE 0.93 0.98 0.87 0.95  CALCIUM 8.7 8.5 8.6 8.4  MG  --  1.9  --   --    Liver Function Tests:  Recent Labs Lab 12/29/13 1724 12/30/13 0430  AST 123* 107*  ALT 48* 42*  ALKPHOS 143* 130*  BILITOT 0.8 0.7  PROT 6.7 6.2  ALBUMIN 2.4* 2.2*    Recent Labs Lab 12/29/13 1724  LIPASE 37    Recent Labs Lab 12/29/13 1724  AMMONIA 83*   CBC:  Recent Labs Lab 12/29/13 1724 12/30/13 0430 12/31/13 0508 01/01/14 0504  WBC 2.4* 2.3* 1.9* 1.7*  HGB 10.7* 9.8* 9.7* 8.9*  HCT 31.9* 30.3* 28.4* 27.6*  MCV 90.1 91.8 92.8 93.2  PLT 58* 50* 48* 45*   Cardiac Enzymes: No results found for this basename: CKTOTAL, CKMB, CKMBINDEX, TROPONINI,  in the last 168 hours BNP: No components found with this basename: POCBNP,  CBG:  Recent Labs Lab 12/30/13 0703 12/31/13 0734  GLUCAP 106* 115*    CULTURE, BLOOD (ROUTINE X 2)     Status: None   Collection Time    12/29/13  9:37 PM      Result Value Ref Range Status   Specimen Description BLOOD L HAND   Final   Value:        BLOOD CULTURE RECEIVED NO GROWTH TO DATE CULTURE WILL BE HELD FOR 5 DAYS BEFORE ISSUING A FINAL NEGATIVE REPORT     Performed at [facility]   Report Status PENDING   Incomplete  MRSA CULTURE     Status: None   Collection Time    12/29/13  9:51 PM      Result Value Ref Range Status   Specimen  Description ABSCESS   Final   Value: STAPHYLOCOCCUS AUREUS     Note: No MRSA Isolated     Performed at [facility]   Report Status 12/31/2013 FINAL   Final  CULTURE, BLOOD (ROUTINE X 2)     Status: None   Collection Time    12/29/13  9:54 PM      Result Value Ref Range Status   Specimen Description BLOOD LEFT ANTECUBITAL   Final   Value:        BLOOD CULTURE RECEIVED NO GROWTH TO DATE CULTURE WILL BE HELD FOR 5 DAYS BEFORE ISSUING A FINAL NEGATIVE REPORT     Performed at [facility]  Report Status PENDING   Incomplete     Studies: Ct Pelvis W Contrast 12/31/2013   IMPRESSION: 1. No evidence of abscess. 2. Small focal ulceration at the posterior upper right thigh, with associated skin thickening. Minimal associated soft tissue inflammation seen. Additional ulcerations not well characterized on CT. 3. Trace free fluid within the pelvis, and mild diffuse mesenteric stranding at the lower abdomen, appear relatively stable and may reflect the patient's hepatic cirrhosis.   Electronically Signed   By: Garald Balding M.D.   On: 12/31/2013 02:53    Scheduled Meds: . budesonide-formoterol  2 puff Inhalation BID  . colchicine  0.6 mg Oral BID  . divalproex  250 mg Oral BID  . feeding supplement   1 Container Oral TID BM  . folic acid  1 mg Oral Daily  . furosemide  20 mg Oral Daily  . gabapentin  300 mg Oral TID  . ipratropium-albuterol  3 mL Nebulization BID  . multivitamin   1 tablet Oral Daily  . nicotine  14 mg Transdermal Daily  . pantoprazole  40 mg Oral Daily  . piperacillin-tazobactam (ZOSYN)  IV  3.375 g Intravenous Q8H  . pregabalin  75 mg Oral BID  . silver sulfADIAZINE   Topical BID  . silver sulfADIAZINE   Topical BID  . thiamine  100 mg Oral Daily  . vancomycin  750 mg Intravenous Q12H  . zolpidem  5 mg Oral QHS   Continuous Infusions: . sodium chloride 0.9 % 1,000 mL with potassium chloride 40 mEq infusion 75 mL/hr at 12/31/13 1008

## 2014-01-02 DIAGNOSIS — L02419 Cutaneous abscess of limb, unspecified: Secondary | ICD-10-CM

## 2014-01-02 DIAGNOSIS — L03119 Cellulitis of unspecified part of limb: Secondary | ICD-10-CM

## 2014-01-02 DIAGNOSIS — D61818 Other pancytopenia: Secondary | ICD-10-CM

## 2014-01-02 LAB — BASIC METABOLIC PANEL
BUN: 7 mg/dL (ref 6–23)
CO2: 30 mEq/L (ref 19–32)
CREATININE: 0.94 mg/dL (ref 0.50–1.10)
Calcium: 8.5 mg/dL (ref 8.4–10.5)
Chloride: 108 mEq/L (ref 96–112)
GFR calc Af Amer: 76 mL/min — ABNORMAL LOW (ref >90)
GFR calc non Af Amer: 66 mL/min — ABNORMAL LOW (ref >90)
Glucose, Bld: 94 mg/dL (ref 70–99)
POTASSIUM: 4.2 meq/L (ref 3.7–5.3)
Sodium: 145 mEq/L (ref 137–147)

## 2014-01-02 LAB — GLUCOSE, CAPILLARY: GLUCOSE-CAPILLARY: 134 mg/dL — AB (ref 70–99)

## 2014-01-02 LAB — CBC
HCT: 29.4 % — ABNORMAL LOW (ref 36.0–46.0)
Hemoglobin: 9.1 g/dL — ABNORMAL LOW (ref 12.0–15.0)
MCH: 29.5 pg (ref 26.0–34.0)
MCHC: 31 g/dL (ref 30.0–36.0)
MCV: 95.5 fL (ref 78.0–100.0)
PLATELETS: 44 10*3/uL — AB (ref 150–400)
RBC: 3.08 MIL/uL — ABNORMAL LOW (ref 3.87–5.11)
RDW: 16.8 % — AB (ref 11.5–15.5)
WBC: 1.7 10*3/uL — AB (ref 4.0–10.5)

## 2014-01-02 NOTE — Progress Notes (Addendum)
Patient ID: Dorothy Burns, female   DOB: 04/25/1956, 58 y.o.   MRN: [id number] [facility] PROGRESS NOTE  Dorothy Burns [id number] DOB: July 05, 1956 DOA: 12/29/2013 PCP: Barbette Merino, MD  Brief narrative: 58 y.o. female with multiple medical problems including cirrhosis of the liver and liver cancer, hepatitis C, polysubstance use who presented to the [facility] ED 12/29/2013 with complaints of 2 ulcers each on lateral thigh with drainage. Per patient's daughters patient also had waxing and waning mental status changes. On admission, she was started on broad spectrum abx, vanco and zosyn. WOC and surgery consulted with recommendations to continue current antibiotics.   Assessment/Plan:   Principal Problem:  Cellulitis and abscess of lateral thigh area  Appreciate very much WOC recommendations. Assessment of the wounds: "Right posterior thigh: 2.5cm round x 0.4cm deep. 75% necrotic tissue and 25% red, moist tissue. Marginal periwound erythema measuring 0.5cm. Left lateral hip: 3cm x 1.5cm x 0 depth, but area is necrotic and presents with 50% white/50% black thin eschar in a surrounding area of erythema, induration and warmth measuring 6cm x 5cm"  Dressing recommendations: saline dressing to the right posterior thigh with a silicone topper and a silicone topper dressing to the left lateral hip. Dressing changes twice daily. Continue current antibiotics, vancomycin and Zosyn. Please note that patient had CT pelvis which did not show evidence of abscess in the region of the 2 ulcers  Active Problems:  HYPERTENSION, BENIGN ESSENTIAL  BP 129/82; not on any antihypertensives COPD (chronic obstructive pulmonary disease)  Stable; continue when necessary nebulizer treatment; continue Symbicort inhaler twice daily Hepatocellular carcinoma  Consulted Dr. Alvy Bimler; will see with her what was the treatment plan in regards to chemo Pancytopenia  Secondary to bone marrow suppression for hep C, liver cirrhosis,  hepatocellular carcinoma. There is no evidence of bleeding. Patient did not require blood transfusion throughout the hospital stay. Hypokalemia  Due to lasix. Patient had potassium supplementation to IV fluids. Her potassium is within normal limits this morning. Liver cirrhosis  Continue lasix, multivitamin, protonix  Stopped laxatives, stool softeners due to multiple episodes of diarrhea; has had 13 BM 6/13 and now only 2 which is better and pt feels more comfortable  Mental status remains good Continue folic acid, multivitamin, thiamine daily Gout  Continue colchicine  Neuropathy  Continue gabapentin and lyrica  H/O substance abuse  Current pain meds: oxycodone PRN, ativan PRN; avoid IV pain meds  Severe protein calorie malnutrition  Pt meets criteria for severe MALNUTRITION in the context of chronic illness as evidenced by reported wt loss of 10% in 19 days and severe fat and muscle wasting.  Continue ensure supplementation    DVT prophylaxis: SCD's bilaterally due to thrombocytopenia   Code Status: full code  Family Communication: plan of care discussed with the patient and her daughters at the bedside  Disposition Plan: home when stable   Consultants:  [place]  Surgery  Oncology (Dr. Alvy Bimler, [place]) Procedures:  None  Antibiotics:  Vanco 12/29/2013 -->  Zosyn 12/29/2013 -->   Leisa Lenz, MD  [facility] Pager [phone]  If 7PM-7AM, please contact night-coverage [email or web address] Password [facility] 01/02/2014, 9:54 AM   LOS: 4 days    HPI/Subjective: No acute overnight events.  Objective: Filed Vitals:   01/01/14 1932 01/01/14 2130 01/02/14 0630 01/02/14 0838  BP:  105/66 129/82   Pulse:  95 82   Temp:  98.6 F (37 C) 98 F (36.7 C)   TempSrc:  Oral Oral   Resp:  18  18   Height:      Weight:      SpO2: 100% 100% 100% 100%    Intake/Output Summary (Last 24 hours) at 01/02/14 0954 Last data filed at 01/02/14 0700  Gross per 24 hour  Intake 1275.83 ml   Output   2275 ml  Net -999.17 ml    Exam:  General: Pt is not in distress Cardiovascular: Regular rate and rhythm, S1/S2 (+ Respiratory: bilateral air entry, no crackles Abdomen: Soft, non tender, distendednon , bowel sounds present  Extremities: at thigh ulcers one on each side.  Neuro: No focal neurologic deficits   Data Reviewed: Basic Metabolic Panel:  Recent Labs Lab 12/29/13 1724 12/30/13 0430 12/31/13 0508 01/01/14 0504 01/02/14 0405  NA 144 147 141 143 145  K 2.5* 2.8* 2.9* 3.3* 4.2  CL 104 107 105 105 108  CO2 33* 31 29 31 30   GLUCOSE 86 91 167* 120* 94  BUN 15 12 8 7 7   CREATININE 0.93 0.98 0.87 0.95 0.94  CALCIUM 8.7 8.5 8.6 8.4 8.5  MG  --  1.9  --   --   --    Liver Function Tests:  Recent Labs Lab 12/29/13 1724 12/30/13 0430  AST 123* 107*  ALT 48* 42*  ALKPHOS 143* 130*  BILITOT 0.8 0.7  PROT 6.7 6.2  ALBUMIN 2.4* 2.2*    Recent Labs Lab 12/29/13 1724  LIPASE 37    Recent Labs Lab 12/29/13 1724  AMMONIA 83*   CBC:  Recent Labs Lab 12/29/13 1724 12/30/13 0430 12/31/13 0508 01/01/14 0504 01/02/14 0405  WBC 2.4* 2.3* 1.9* 1.7* 1.7*  HGB 10.7* 9.8* 9.7* 8.9* 9.1*  HCT 31.9* 30.3* 28.4* 27.6* 29.4*  MCV 90.1 91.8 92.8 93.2 95.5  PLT 58* 50* 48* 45* 44*   Cardiac Enzymes: No results found for this basename: CKTOTAL, CKMB, CKMBINDEX, TROPONINI,  in the last 168 hours BNP: No components found with this basename: POCBNP,  CBG:  Recent Labs Lab 12/30/13 0703 12/31/13 0734 01/01/14 0707 01/02/14 0745  GLUCAP 106* 115* 82 134*    CULTURE, BLOOD (ROUTINE X 2)     Status: None   Collection Time    12/29/13  9:37 PM      Result Value Ref Range Status   Specimen Description BLOOD L HAND   Final   Value:        BLOOD CULTURE RECEIVED NO GROWTH TO DATE CULTURE WILL BE HELD FOR 5 DAYS BEFORE ISSUING A FINAL NEGATIVE REPORT     Performed at [facility]   Report Status PENDING   Incomplete  MRSA CULTURE     Status:  None   Collection Time    12/29/13  9:51 PM      Result Value Ref Range Status   Specimen Description ABSCESS   Final   Value: STAPHYLOCOCCUS AUREUS     Note: No MRSA Isolated     Performed at [facility]   Report Status 12/31/2013 FINAL   Final  CULTURE, BLOOD (ROUTINE X 2)     Status: None   Collection Time    12/29/13  9:54 PM      Result Value Ref Range Status   Specimen Description BLOOD LEFT ANTECUBITAL   Final   Value:        BLOOD CULTURE RECEIVED NO GROWTH TO DATE CULTURE WILL BE HELD FOR 5 DAYS BEFORE ISSUING A FINAL NEGATIVE REPORT     Performed at [facility]  Report Status PENDING   Incomplete     Studies: No results found.  Scheduled Meds: . budesonide-formoterol  2 puff Inhalation BID  . colchicine  0.6 mg Oral BID  . divalproex  250 mg Oral BID  . feeding supplement   1 Container Oral TID BM  . folic acid  1 mg Oral Daily  . furosemide  20 mg Oral Daily  . gabapentin  300 mg Oral TID  . multivitamin   1 tablet Oral Daily  . nicotine  14 mg Transdermal Daily  . pantoprazole  40 mg Oral Daily  . piperacillin-tazobactam   3.375 g Intravenous Q8H  . pregabalin  75 mg Oral BID  . silver sulfADIAZINE   Topical BID  . thiamine  100 mg Oral Daily  . vancomycin  750 mg Intravenous Q12H  . zolpidem  5 mg Oral QHS

## 2014-01-02 NOTE — Consult Note (Signed)
WOC wound follow up: Asked to clarify wound care orders Wound type: traumatic vs thermal vs infectious Dressing procedure/placement/frequency:Wound care as directed by Dr. Marlou Starks, CCS who agrees with recommendation for antimicrobial topical therapy (silver sulfadiazine twice daily). CCS following. [place] nursing team will not follow, but will remain available to this patient, the nursing, surgical and medical teams.  Please re-consult if needed. Thanks, Maudie Flakes, MSN, RN, [place], [place], [place] [phone])

## 2014-01-02 NOTE — Progress Notes (Signed)
Physical Therapy Treatment Patient Details Name: Dorothy Burns MRN: [id number] DOB: 20-Oct-1955 Today's Date: 01/02/2014    History of Present Illness 58 yo female admitted with cellultis. hx of gout, Hep C, HTN, bipolar d/o, COPD, substance abuse, cirrhosis, new diagnosis of liver cancer. Pt lives alone    PT Comments    Pt sleeping and required increased time to arouse and orient to current time of day as pt thought it was the next morning.  Pt impulsive requesting to void and swinging her legs OOB before I could move call light and IV tubing.  Assisted to [facility] for urgency.  Amb in hallway. Pt demon poor safety cognition and unsteady drunken gait. Amb distance shortened by pt's request to go back to her room.   Follow Up Recommendations  Home health PT;Supervision - Intermittent     Equipment Recommendations       Recommendations for Other Services       Precautions / Restrictions Precautions Precautions: Fall Restrictions Weight Bearing Restrictions: No    Mobility  Bed Mobility Overal bed mobility: Modified Independent                Transfers Overall transfer level: Needs assistance Equipment used: None Transfers: Sit to/from Stand Sit to Stand: Supervision         General transfer comment: VCs hand placement.   Ambulation/Gait Ambulation/Gait assistance: Min assist Ambulation Distance (Feet): 65 Feet Assistive device: Rolling walker (2 wheeled) Gait Pattern/deviations: Step-through pattern;Trunk flexed;Decreased stride length Gait velocity: decreased   General Gait Details: unsteady/groggy gait demon poor safety cognition/impulsive.     Stairs            Wheelchair Mobility    Modified Rankin (Stroke Patients Only)       Balance                                    Cognition                            Exercises      General Comments        Pertinent Vitals/Pain     Home Living                      Prior Function            PT Goals (current goals can now be found in the care plan section) Progress towards PT goals: Progressing toward goals    Frequency  Min 3X/week    PT Plan      Co-evaluation             End of Session Equipment Utilized During Treatment: Gait belt Activity Tolerance: Patient tolerated treatment well Patient left: in bed;with call bell/phone within reach     Time: 1450-1505 PT Time Calculation (min): 15 min  Charges:  $Gait Training: 8-22 mins                    G Codes:      Rica Koyanagi  PTA [facility]  Acute  Rehab Pager      [phone]

## 2014-01-02 NOTE — Progress Notes (Signed)
Pt oob in hallway w/ PT.  Pt was given Klonopin 1mg  at 1300. Pt disoriented to time (as well as day) and irritable.  Pt stated earlier that she takes same dose at home and just wants to "sleep all the time".  Before dose was given, pt was seen walking around room unaware that IV was connected and cursing loudly.  Repeatedly asked pt to call before getting oob d/t pt's inability to disconnect SCD hose and to pull IV pole w/ her.  Pt shaking and frequently irrational

## 2014-01-02 NOTE — Progress Notes (Signed)
Patient ID: Dorothy Burns, female   DOB: 1955/10/11, 58 y.o.   MRN: [id number]   Subjective: Afebrile.  Little pain.   Objective:  Vital signs:  Filed Vitals:   01/01/14 1932 01/01/14 2130 01/02/14 0630 01/02/14 0838  BP:  105/66 129/82   Pulse:  95 82   Temp:  98.6 F (37 C) 98 F (36.7 C)   TempSrc:  Oral Oral   Resp:  18 18   Height:      Weight:      SpO2: 100% 100% 100% 100%    Last BM Date: 12/31/13  Intake/Output   Yesterday:  06/14 0701 - 06/15 0700 In: 1515.8 [P.O.:600; I.V.:465.8; IV Piggyback:450] Out: 2675 [Urine:2675] This shift:    I/O last 3 completed shifts: In: 2850.8 [P.O.:840; I.V.:1310.8; IV Piggyback:700] Out: 4000 [Urine:4000]    Physical Exam: General: Pt awake/alert/oriented x4 in no acute distress Skin: right posterior thigh-quarter sized area of erythema and induration with minimal purulent drainage.  Left lateral hip approx 2-3cm area of necrosis with a rim of erythema, ttp and warm to touch.     Problem List:   Principal Problem:   Cellulitis and abscess of buttock Active Problems:   TOBACCO USE   SUBSTANCE ABUSE, MULTIPLE   HYPERTENSION, BENIGN ESSENTIAL   COPD   Cancer, hepatocellular   Liver cirrhosis    Results:   Labs: Results for orders placed during the hospital encounter of 12/29/13 (from the past 48 hour(s))  CBC     Status: Abnormal   Collection Time    01/01/14  5:04 AM      Result Value Ref Range   WBC 1.7 (*) 4.0 - 10.5 K/uL   RBC 2.96 (*) 3.87 - 5.11 MIL/uL   Hemoglobin 8.9 (*) 12.0 - 15.0 g/dL   HCT 27.6 (*) 36.0 - 46.0 %   MCV 93.2  78.0 - 100.0 fL   MCH 30.1  26.0 - 34.0 pg   MCHC 32.2  30.0 - 36.0 g/dL   RDW 16.4 (*) 11.5 - 15.5 %   Platelets 45 (*) 150 - 400 K/uL   Comment: CONSISTENT WITH PREVIOUS RESULT  BASIC METABOLIC PANEL     Status: Abnormal   Collection Time    01/01/14  5:04 AM      Result Value Ref Range   Sodium 143  137 - 147 mEq/L   Potassium 3.3 (*) 3.7 - 5.3 mEq/L   Chloride  105  96 - 112 mEq/L   CO2 31  19 - 32 mEq/L   Glucose, Bld 120 (*) 70 - 99 mg/dL   BUN 7  6 - 23 mg/dL   Creatinine, Ser 0.95  0.50 - 1.10 mg/dL   Calcium 8.4  8.4 - 10.5 mg/dL   GFR calc non Af Amer 65 (*) >90 mL/min   GFR calc Af Amer 75 (*) >90 mL/min   Comment: (NOTE)     The eGFR has been calculated using the CKD EPI equation.     This calculation has not been validated in all clinical situations.     eGFR's persistently <90 mL/min signify possible Chronic Kidney     Disease.  GLUCOSE, CAPILLARY     Status: None   Collection Time    01/01/14  7:07 AM      Result Value Ref Range   Glucose-Capillary 82  70 - 99 mg/dL  VANCOMYCIN, TROUGH     Status: None   Collection Time    01/01/14  11:40 AM      Result Value Ref Range   Vancomycin Tr 11.3  10.0 - 20.0 ug/mL  CBC     Status: Abnormal   Collection Time    01/02/14  4:05 AM      Result Value Ref Range   WBC 1.7 (*) 4.0 - 10.5 K/uL   RBC 3.08 (*) 3.87 - 5.11 MIL/uL   Hemoglobin 9.1 (*) 12.0 - 15.0 g/dL   HCT 29.4 (*) 36.0 - 46.0 %   MCV 95.5  78.0 - 100.0 fL   MCH 29.5  26.0 - 34.0 pg   MCHC 31.0  30.0 - 36.0 g/dL   RDW 16.8 (*) 11.5 - 15.5 %   Platelets 44 (*) 150 - 400 K/uL   Comment: CONSISTENT WITH PREVIOUS RESULT  BASIC METABOLIC PANEL     Status: Abnormal   Collection Time    01/02/14  4:05 AM      Result Value Ref Range   Sodium 145  137 - 147 mEq/L   Potassium 4.2  3.7 - 5.3 mEq/L   Comment: DELTA CHECK NOTED     REPEATED TO VERIFY   Chloride 108  96 - 112 mEq/L   CO2 30  19 - 32 mEq/L   Glucose, Bld 94  70 - 99 mg/dL   BUN 7  6 - 23 mg/dL   Creatinine, Ser 0.94  0.50 - 1.10 mg/dL   Calcium 8.5  8.4 - 10.5 mg/dL   GFR calc non Af Amer 66 (*) >90 mL/min   GFR calc Af Amer 76 (*) >90 mL/min   Comment: (NOTE)     The eGFR has been calculated using the CKD EPI equation.     This calculation has not been validated in all clinical situations.     eGFR's persistently <90 mL/min signify possible Chronic Kidney      Disease.  GLUCOSE, CAPILLARY     Status: Abnormal   Collection Time    01/02/14  7:45 AM      Result Value Ref Range   Glucose-Capillary 134 (*) 70 - 99 mg/dL    Imaging / Studies: No results found.  Scheduled Meds: . antiseptic oral rinse  15 mL Mouth Rinse BID  . budesonide-formoterol  2 puff Inhalation BID  . colchicine  0.6 mg Oral BID  . divalproex  250 mg Oral BID  . feeding supplement (ENSURE)  1 Container Oral TID BM  . folic acid  1 mg Oral Daily  . furosemide  20 mg Oral Daily  . gabapentin  300 mg Oral TID  . multivitamin with minerals  1 tablet Oral Daily  . nicotine  14 mg Transdermal Daily  . pantoprazole  40 mg Oral Daily  . piperacillin-tazobactam (ZOSYN)  IV  3.375 g Intravenous Q8H  . pregabalin  75 mg Oral BID  . silver sulfADIAZINE   Topical BID  . thiamine  100 mg Oral Daily  . vancomycin  750 mg Intravenous Q12H  . zolpidem  5 mg Oral QHS   Continuous Infusions:  PRN Meds:.albuterol, alum & mag hydroxide-simeth, clonazePAM, LORazepam, ondansetron (ZOFRAN) IV, ondansetron, oxyCODONE   Antibiotics: Anti-infectives   Start     Dose/Rate Route Frequency Ordered Stop   12/30/13 1200  vancomycin (VANCOCIN) IVPB 750 mg/150 ml premix     750 mg 150 mL/hr over 60 Minutes Intravenous Every 12 hours 12/29/13 2156     12/29/13 2200  piperacillin-tazobactam (ZOSYN) IVPB 3.375 g  3.375 g 12.5 mL/hr over 240 Minutes Intravenous Every 8 hours 12/29/13 2148     12/29/13 2200  vancomycin (VANCOCIN) IVPB 1000 mg/200 mL premix     1,000 mg 200 mL/hr over 60 Minutes Intravenous  Once 12/29/13 2148 12/29/13 2334      Assessment/Plan Right posterior thigh wound-minimal erythema and drainage, continue with antibiotics, apply dry dressing daily Left lateral hip, necrosis, cellulitis- continue with IV antibiotics.  Poor surgical candidate given liver disease, will consider bedside debridement if no improvement.   Will follow  Erby Pian, [facility] Pager [phone] Office [phone]  01/02/2014 8:58 AM

## 2014-01-02 NOTE — Progress Notes (Signed)
Continue to observe on abx.  Dorothy Burns

## 2014-01-03 LAB — BASIC METABOLIC PANEL
BUN: 8 mg/dL (ref 6–23)
CO2: 28 mEq/L (ref 19–32)
Calcium: 9 mg/dL (ref 8.4–10.5)
Chloride: 107 mEq/L (ref 96–112)
Creatinine, Ser: 1.03 mg/dL (ref 0.50–1.10)
GFR calc non Af Amer: 59 mL/min — ABNORMAL LOW (ref >90)
GFR, EST AFRICAN AMERICAN: 68 mL/min — AB (ref >90)
GLUCOSE: 101 mg/dL — AB (ref 70–99)
Potassium: 4.1 mEq/L (ref 3.7–5.3)
Sodium: 144 mEq/L (ref 137–147)

## 2014-01-03 LAB — CBC
HCT: 29 % — ABNORMAL LOW (ref 36.0–46.0)
HEMOGLOBIN: 9 g/dL — AB (ref 12.0–15.0)
MCH: 29.8 pg (ref 26.0–34.0)
MCHC: 31 g/dL (ref 30.0–36.0)
MCV: 96 fL (ref 78.0–100.0)
Platelets: 48 10*3/uL — ABNORMAL LOW (ref 150–400)
RBC: 3.02 MIL/uL — AB (ref 3.87–5.11)
RDW: 16.7 % — ABNORMAL HIGH (ref 11.5–15.5)
WBC: 2 10*3/uL — ABNORMAL LOW (ref 4.0–10.5)

## 2014-01-03 LAB — GLUCOSE, CAPILLARY: Glucose-Capillary: 92 mg/dL (ref 70–99)

## 2014-01-03 MED ORDER — FOLIC ACID 1 MG PO TABS
1.0000 mg | ORAL_TABLET | Freq: Every day | ORAL | Status: DC
Start: 2014-01-03 — End: 2014-03-21

## 2014-01-03 MED ORDER — ONDANSETRON HCL 4 MG PO TABS: 4.0000 mg | ORAL_TABLET | Freq: Four times a day (QID) | ORAL | Status: DC | PRN

## 2014-01-03 MED ORDER — OXYCODONE HCL 5 MG PO TABS: 5.0000 mg | ORAL_TABLET | ORAL | Status: DC | PRN

## 2014-01-03 MED ORDER — AMOXICILLIN-POT CLAVULANATE 875-125 MG PO TABS: 1.0000 | ORAL_TABLET | Freq: Two times a day (BID) | ORAL | Status: DC

## 2014-01-03 MED ORDER — SILVER SULFADIAZINE 1 % EX CREA: TOPICAL_CREAM | Freq: Two times a day (BID) | CUTANEOUS | Status: DC

## 2014-01-03 MED ORDER — ADULT MULTIVITAMIN W/MINERALS CH: 1.0000 | ORAL_TABLET | Freq: Every day | ORAL | Status: DC

## 2014-01-03 MED ORDER — DOXYCYCLINE HYCLATE 100 MG PO TABS: 100.0000 mg | ORAL_TABLET | Freq: Two times a day (BID) | ORAL | Status: DC
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
## 2014-01-03 NOTE — Progress Notes (Signed)
Nurse reviewed discharge instructions with pt and family.  Pt and family verbalized understanding of discharge instructions and new medications.  No concerns at time of discharge.  Home health has been arranged.

## 2014-01-03 NOTE — Discharge Summary (Signed)
Physician Discharge Summary  Dorothy Burns IEP:[id number] DOB: 1956-01-22 DOA: 12/29/2013  PCP: Barbette Merino, MD  Admit date: 12/29/2013 Discharge date: 01/03/2014  Recommendations for Outpatient Follow-up:  1. Continue doxycycline and Augmentin  for 4 more days on discharge for treatment of 2 ulcers. 2. FOllow up with PCP in inext 4-5 days to make sure your symptoms are resolving.    Discharge wound care:    Complete by:  As directed  Right posterior thigh: 2.5cm round x 0.4cm deep. 75% necrotic tissue and 25% red, moist tissue. Marginal periwound erythema measuring 0.5cm. Left lateral hip: 3cm x 1.5cm x 0 depth, but area is necrotic and presents with 50% white/50% black thin eschar in a surrounding area of erythema, induration and warmth measuring 6cm x 5cm"   Dressing recommendations: saline dressing to the right posterior thigh with a silicone topper and a silicone topper dressing to the left lateral hip. Dressing changes twice daily.       Discharge Diagnoses:  Principal Problem:   Cellulitis and abscess of buttock Active Problems:   TOBACCO USE   SUBSTANCE ABUSE, MULTIPLE   HYPERTENSION, BENIGN ESSENTIAL   COPD   Cancer, hepatocellular   Liver cirrhosis    Discharge Condition: stable   Diet recommendation: as tolerated   History of present illness:  58 y.o. female with multiple medical problems including cirrhosis of the liver and liver cancer, hepatitis C, polysubstance use who presented to the [facility] ED 12/29/2013 with complaints of 2 ulcers each on lateral thigh with drainage. Per patient's daughters patient also had waxing and waning mental status changes. On admission, she was started on broad spectrum abx, vanco and zosyn. WOC and surgery consulted.  Assessment/Plan:   Principal Problem:  Cellulitis and abscess of lateral thigh area  Appreciate very much WOC recommendations. Assessment of the wounds: "Right posterior thigh: 2.5cm round x 0.4cm deep. 75% necrotic tissue  and 25% red, moist tissue. Marginal periwound erythema measuring 0.5cm. Left lateral hip: 3cm x 1.5cm x 0 depth, but area is necrotic and presents with 50% white/50% black thin eschar in a surrounding area of erythema, induration and warmth measuring 6cm x 5cm"  Dressing recommendations: saline dressing to the right posterior thigh with a silicone topper and a silicone topper dressing to the left lateral hip. Dressing changes twice daily.  Patient was on vancomycin and Zosyn until the time of discharge. Patient will continue Augmentin and doxycycline for 4 more days on discharge. Please note that CT pelvis did not show evidence of the abscess into the thigh region  Active Problems:  HYPERTENSION, BENIGN ESSENTIAL  Patient is not on antihypertensive medications. Blood pressure control COPD (chronic obstructive pulmonary disease)  Stable; continue home inhaler regimen Hepatocellular carcinoma  Consulted Dr. Alvy Bimler; patient will followup with cancer Center per scheduled appointment in regards to further chemotherapy treatment plan Pancytopenia  Secondary to bone marrow suppression for hep C, liver cirrhosis, hepatocellular carcinoma. There is no evidence of bleeding. Patient did not require blood transfusion throughout the hospital stay. Hypokalemia  Due to lasix. Patient had potassium supplementation to IV fluids. Her potassium is within normal limits Liver cirrhosis  Continue lasix, multivitamin, protonix  Stopped laxatives and stool softeners secondary to multiple episodes of diarrhea. Diarrhea has significantly improved. Mental status remains good  Continue folic acid, multivitamin, thiamine daily Gout  Continue colchicine  Neuropathy  Continue gabapentin and lyrica  H/O substance abuse  Current pain meds per previous home regimen. Please note that patient has a daughter  who she mentioned to me and to the nurse is using drugs and is taking her medications. At the time of discharge only 10  pills were prescribed to get her enough until she sees PCP.  Severe protein calorie malnutrition  Pt meets criteria for severe MALNUTRITION in the context of chronic illness as evidenced by reported wt loss of 10% in 19 days and severe fat and muscle wasting.  Continue ensure supplementation    DVT prophylaxis: SCD's bilaterally due to thrombocytopenia while in hospital    Code Status: full code  Family Communication: plan of care discussed with the patient and her daughters at the bedside   Consultants:  [facility] (Dr. Alvy Bimler, [place]) Procedures:  None  Antibiotics:  Vanco 12/29/2013 --> 01/03/2014 Zosyn 12/29/2013 -->01/03/2014   Signed:  Leisa Lenz, MD  [facility] 01/03/2014, 6:23 AM  Pager #: [phone]   Discharge Exam: Filed Vitals:   01/03/14 0614  BP: 94/60  Pulse: 78  Temp: 97.5 F (36.4 C)  Resp: 18   Filed Vitals:   01/02/14 1300 01/02/14 2111 01/02/14 2226 01/03/14 0614  BP: 96/62 90/50  94/60  Pulse: 84 72  78  Temp: 98.6 F (37 C) 99.1 F (37.3 C)  97.5 F (36.4 C)  TempSrc: Oral Oral  Oral  Resp: 16 16  18   Height:      Weight:      SpO2: 100% 99% 100% 98%    Exam:  General: Pt is not in distress  Cardiovascular: Regular rate and rhythm, S1/S2 (+  Respiratory: bilateral air entry, no crackles  Abdomen: Soft, non tender, distendednon , bowel sounds present  Extremities: left thigh ulcers one on each side, improving, dressing in place Neuro: No focal neurologic deficits   Discharge Instructions  Discharge Instructions   Diet - low sodium heart healthy    Complete by:  As directed      Discharge instructions    Complete by:  As directed   1. Continue doxycycline and Augmentin  for 4 more days on discharge for treatment of 2 ulcers. 2. FOllow up with PCP in inext 4-5 days to make sure your symptoms are resolving.     Discharge wound care:    Complete by:  As directed   Right posterior thigh: 2.5cm round x 0.4cm  deep. 75% necrotic tissue and 25% red, moist tissue. Marginal periwound erythema measuring 0.5cm. Left lateral hip: 3cm x 1.5cm x 0 depth, but area is necrotic and presents with 50% white/50% black thin eschar in a surrounding area of erythema, induration and warmth measuring 6cm x 5cm"   Dressing recommendations: saline dressing to the right posterior thigh with a silicone topper and a silicone topper dressing to the left lateral hip. Dressing changes twice daily.     Increase activity slowly    Complete by:  As directed             Medication List    STOP taking these medications       hydrochlorothiazide 25 MG tablet  Commonly known as:  HYDRODIURIL      TAKE these medications       amoxicillin-clavulanate 875-125 MG per tablet  Commonly known as:  AUGMENTIN  Take 1 tablet by mouth 2 (two) times daily.     B-1 PO  Take 1 capsule by mouth daily.     B-12 PO  Take 1 capsule by mouth daily.     budesonide-formoterol 160-4.5 MCG/ACT inhaler  Commonly known as:  SYMBICORT  Inhale 2 puffs into the lungs 2 (two) times daily.     clonazePAM 1 MG tablet  Commonly known as:  KLONOPIN  Take 1 mg by mouth 4 (four) times daily as needed for anxiety.     colchicine 0.6 MG tablet  Commonly known as:  COLCRYS  Take 1 tablet (0.6 mg total) by mouth 2 (two) times daily.     divalproex 250 MG DR tablet  Commonly known as:  DEPAKOTE  Take 250 mg by mouth 2 (two) times daily.     doxycycline 100 MG tablet  Commonly known as:  VIBRA-TABS  Take 1 tablet (100 mg total) by mouth 2 (two) times daily.     folic acid 1 MG tablet  Commonly known as:  FOLVITE  Take 1 tablet (1 mg total) by mouth daily.     furosemide 20 MG tablet  Commonly known as:  LASIX  Take 20 mg by mouth daily.     gabapentin 300 MG capsule  Commonly known as:  NEURONTIN  Take 300 mg by mouth 3 (three) times daily.     morphine 15 MG tablet  Commonly known as:  MSIR  Take 1 tablet (15 mg total) by mouth  every 8 (eight) hours as needed for severe pain.     multivitamin with minerals Tabs tablet  Take 1 tablet by mouth daily.     omeprazole 20 MG capsule  Commonly known as:  PRILOSEC  Take 20 mg by mouth daily.     ondansetron 4 MG tablet  Commonly known as:  ZOFRAN  Take 1 tablet (4 mg total) by mouth every 6 (six) hours as needed for nausea.     oxyCODONE 5 MG immediate release tablet  Commonly known as:  Oxy IR/ROXICODONE  Take 1 tablet (5 mg total) by mouth every 4 (four) hours as needed for moderate pain.     pregabalin 75 MG capsule  Commonly known as:  LYRICA  Take 75 mg by mouth 2 (two) times daily.     silver sulfADIAZINE 1 % cream  Commonly known as:  SILVADENE  Apply topically 2 (two) times daily.     zolpidem 10 MG tablet  Commonly known as:  AMBIEN  Take 10 mg by mouth at bedtime.           Follow-up Information   Follow up with [facility], MD. Schedule an appointment as soon as possible for a visit in 1 week. (Follow up appt after recent hospitalization, For wound re-check)    Specialty:  Internal Medicine   Contact information:   [address] [place] [id number] [phone]        The results of significant diagnostics from this hospitalization (including imaging, microbiology, ancillary and laboratory) are listed below for reference.    Significant Diagnostic Studies: Dg Chest 1 View  12/30/2013   CLINICAL DATA:  Shortness of breath.  COPD.  EXAM: CHEST - 1 VIEW  COMPARISON:  12/01/2013.  FINDINGS: The heart size and mediastinal contours are within normal limits. Both lungs are clear. The visualized skeletal structures show no acute abnormality.  IMPRESSION: No active disease.  Stable chest.   Electronically Signed   By: Rolla Flatten M.D.   On: 12/30/2013 01:50   Ct Chest W Contrast  12/22/2013   CLINICAL DATA:  Hepatocellular carcinoma, for staging  EXAM: CT CHEST, ABDOMEN, AND PELVIS WITH CONTRAST  TECHNIQUE: Multidetector CT imaging of the  chest, abdomen  and pelvis was performed following the standard protocol during bolus administration of intravenous contrast.  CONTRAST:  126mL OMNIPAQUE IOHEXOL 300 MG/ML  SOLN  COMPARISON:  MRI abdomen dated 12/15/2013  FINDINGS: CT CHEST FINDINGS  Evaluation of the lung parenchyma is constrained by respiratory motion. Mild dependent atelectasis in the right lower lobe. No suspicious pulmonary nodules. No pleural effusion or pneumothorax.  Visualized thyroid is unremarkable.  The heart is normal in size. No pericardial effusion. Mild atherosclerotic calcifications of the aortic arch.  No suspicious mediastinal, hilar, or axillary lymphadenopathy.  Mild degenerative changes of the lower thoracic spine.  CT ABDOMEN AND PELVIS FINDINGS  Cirrhosis. 3.7 x 3.7 x 3.6 cm enhancing lesion in the anterior segment right hepatic dome (series 2/ image 16). 4.7 x 4.9 x 4.3 cm enhancing lesion in the lateral segment left hepatic lobe (series 2/ image 19). Both lesions are compatible with [place] on MRI.  Splenomegaly, measuring 16.0 cm in maximal craniocaudal dimension.  Small gastroesophageal varices. Recanalized paraumbilical vein. Portal vein is patient.  Periportal and portacaval lymphadenopathy, measuring up to 1.8 cm short axis (series 6/image 35), indeterminate. Small retroperitoneal nodes measuring up to 8 mm short axis (series 6/image 72), which do not meet pathologic CT size criteria.  Trace pelvic ascites.  Status post cholecystectomy. No intrahepatic or extrahepatic ductal dilatation.  Pancreas and adrenal glands are within normal limits.  Kidney is within normal limits.  No hydronephrosis.  No evidence of bowel obstruction. Normal appendix. Mild colonic wall thickening with pericolonic stranding in the right mid abdomen (Series 4/image 141), favored to reflect incidental/secondary finding, less likely mild infectious/inflammatory colitis.  No evidence of abdominal aortic aneurysm. Circumaortic left renal vein.  Uterus  and bilateral ovaries are unremarkable.  Bladder is within normal limits.  Visualized osseous structures are within normal limits.  IMPRESSION: 3.7 cm enhancing lesion in the anterior segment right hepatic dome and 4.9 cm enhancing lesion in the lateral segment left hepatic lobe, both of which are compatible with [place] on MRI.  Mild periportal and portacaval lymphadenopathy, indeterminate.  No evidence of distant metastases in the chest, abdomen, or pelvis.  Cirrhosis with stigmata of portal hypertension, including splenomegaly and small gastroesophageal varices. Portal vein is patent.   Electronically Signed   By: Julian Hy M.D.   On: 12/22/2013 09:11   Ct Pelvis W Contrast  12/31/2013   CLINICAL DATA:  Bilateral thigh ulcerations.  Assess for abscess.  EXAM: CT PELVIS WITH CONTRAST  TECHNIQUE: Multidetector CT imaging of the pelvis was performed using the standard protocol following the bolus administration of intravenous contrast.  CONTRAST:  133mL OMNIPAQUE IOHEXOL 300 MG/ML  SOLN  COMPARISON:  CT of the abdomen performed 12/22/2013  FINDINGS: A small focal ulceration is noted at the posterior upper right thigh, with associated skin thickening. No definite left-sided ulceration is identified. There is no evidence of underlying abscess. Only minimal associated soft tissue inflammation is seen.  Trace fluid within the pelvis, and mild diffuse mesenteric stranding within the lower abdomen, appear relatively stable from the prior study and may reflect the patient's hepatic cirrhosis. No significant small or large bowel abnormalities are identified. The uterus is grossly unremarkable. The ovaries are relatively symmetric. No suspicious adnexal masses are seen. No inguinal lymphadenopathy is appreciated.  The underlying gluteus musculature is unremarkable in appearance. The proximal thighs are otherwise grossly unremarkable in appearance. There is no evidence of vascular compromise. Minimal calcification is  noted along the common iliac arteries bilaterally.  IMPRESSION:  1. No evidence of abscess. 2. Small focal ulceration at the posterior upper right thigh, with associated skin thickening. Minimal associated soft tissue inflammation seen. Additional ulcerations not well characterized on CT. 3. Trace free fluid within the pelvis, and mild diffuse mesenteric stranding at the lower abdomen, appear relatively stable and may reflect the patient's hepatic cirrhosis.   Electronically Signed   By: Garald Balding M.D.   On: 12/31/2013 02:53   Ct Abdomen Pelvis W Contrast  12/22/2013   CLINICAL DATA:  Hepatocellular carcinoma, for staging  EXAM: CT CHEST, ABDOMEN, AND PELVIS WITH CONTRAST  TECHNIQUE: Multidetector CT imaging of the chest, abdomen and pelvis was performed following the standard protocol during bolus administration of intravenous contrast.  CONTRAST:  147mL OMNIPAQUE IOHEXOL 300 MG/ML  SOLN  COMPARISON:  MRI abdomen dated 12/15/2013  FINDINGS: CT CHEST FINDINGS  Evaluation of the lung parenchyma is constrained by respiratory motion. Mild dependent atelectasis in the right lower lobe. No suspicious pulmonary nodules. No pleural effusion or pneumothorax.  Visualized thyroid is unremarkable.  The heart is normal in size. No pericardial effusion. Mild atherosclerotic calcifications of the aortic arch.  No suspicious mediastinal, hilar, or axillary lymphadenopathy.  Mild degenerative changes of the lower thoracic spine.  CT ABDOMEN AND PELVIS FINDINGS  Cirrhosis. 3.7 x 3.7 x 3.6 cm enhancing lesion in the anterior segment right hepatic dome (series 2/ image 16). 4.7 x 4.9 x 4.3 cm enhancing lesion in the lateral segment left hepatic lobe (series 2/ image 19). Both lesions are compatible with [place] on MRI.  Splenomegaly, measuring 16.0 cm in maximal craniocaudal dimension.  Small gastroesophageal varices. Recanalized paraumbilical vein. Portal vein is patient.  Periportal and portacaval lymphadenopathy, measuring up to  1.8 cm short axis (series 6/image 35), indeterminate. Small retroperitoneal nodes measuring up to 8 mm short axis (series 6/image 72), which do not meet pathologic CT size criteria.  Trace pelvic ascites.  Status post cholecystectomy. No intrahepatic or extrahepatic ductal dilatation.  Pancreas and adrenal glands are within normal limits.  Kidney is within normal limits.  No hydronephrosis.  No evidence of bowel obstruction. Normal appendix. Mild colonic wall thickening with pericolonic stranding in the right mid abdomen (Series 4/image 141), favored to reflect incidental/secondary finding, less likely mild infectious/inflammatory colitis.  No evidence of abdominal aortic aneurysm. Circumaortic left renal vein.  Uterus and bilateral ovaries are unremarkable.  Bladder is within normal limits.  Visualized osseous structures are within normal limits.  IMPRESSION: 3.7 cm enhancing lesion in the anterior segment right hepatic dome and 4.9 cm enhancing lesion in the lateral segment left hepatic lobe, both of which are compatible with [place] on MRI.  Mild periportal and portacaval lymphadenopathy, indeterminate.  No evidence of distant metastases in the chest, abdomen, or pelvis.  Cirrhosis with stigmata of portal hypertension, including splenomegaly and small gastroesophageal varices. Portal vein is patent.   Electronically Signed   By: Julian Hy M.D.   On: 12/22/2013 09:11   Mr Liver W Wo Contrast  12/16/2013   CLINICAL DATA:  Abnormal LFTs, weight loss, abdominal pain, evaluate liver lesion on ultrasound  EXAM: MRI ABDOMEN WITHOUT AND WITH CONTRAST  TECHNIQUE: Multiplanar, multisequence MR imaging was performed both before and after administration of intravenous contrast.  CONTRAST:  76mL MULTIHANCE GADOBENATE DIMEGLUMINE 529 MG/ML IV SOLN  COMPARISON:  Ultrasound abdomen dated 08/01/2012  FINDINGS: Cirrhosis.  No hepatic steatosis.  Two enhancing hepatic lesions, as follows:  --3.5 x 3.4 x 4.8 cm rounded  enhancing lesion  in the anterior segment right hepatic dome (series 16/image 14)  --4.8 x 5.6 x 4.2 cm ovoid heterogeneously enhancing lesion in the lateral segment left hepatic lobe (series 16/image 22)  The right hepatic lobe lesion was not evident on prior ultrasound, while the left hepatic lobe lesion has increased from 2.6 cm. Both lesions demonstrate early arterial enhancement and portal venous washout. In the setting of underlying cirrhosis, both lesions meet AASLD criteria for imaging diagnosis of hepatocellular carcinoma.  Splenomegaly, measuring 14.5 cm in maximal craniocaudal dimension.  No abdominal ascites.  Prominent periportal/portacaval nodes measuring up to 1.8 cm short axis (series 4/image 15), indeterminate.  Small gastric varices and a recanalized periumbilical vein. Portal vein remains patent.  Pancreas and adrenal glands are within normal limits.  Status post cholecystectomy. No intrahepatic or extrahepatic ductal dilatation.  9 mm right upper pole renal cyst (series 7/image 15). Left kidney is within normal limits. No hydronephrosis.  No focal osseous lesions.  IMPRESSION: 5.6 cm enhancing lesion in the lateral segment left hepatic lobe, increased from prior ultrasound.  4.8 cm enhancing lesion in the anterior segment right hepatic dome, not definitely visualized on prior ultrasound.  Both lesions meet AASLD criteria for imaging diagnosis of hepatocellular carcinoma.  Underlying cirrhosis with splenomegaly and stigmata of portal hypertension, as above. Mildly prominent periportal/portacaval nodes, indeterminate.  These results will be called to the ordering clinician or representative by the Radiologist Assistant, and communication documented in the PACS or zVision Dashboard.   Electronically Signed   By: Julian Hy M.D.   On: 12/16/2013 08:56    Microbiology: Recent Results (from the past 240 hour(s))  CULTURE, BLOOD (ROUTINE X 2)     Status: None   Collection Time    12/29/13   9:37 PM      Result Value Ref Range Status   Specimen Description BLOOD L HAND   Final   Special Requests BOTTLES DRAWN AEROBIC ONLY 5ML   Final   Culture  Setup Time     Final   Value: 12/30/2013 00:54     Performed at [facility]   Culture     Final   Value:        BLOOD CULTURE RECEIVED NO GROWTH TO DATE CULTURE WILL BE HELD FOR 5 DAYS BEFORE ISSUING A FINAL NEGATIVE REPORT     Performed at [facility]   Report Status PENDING   Incomplete  MRSA CULTURE     Status: None   Collection Time    12/29/13  9:51 PM      Result Value Ref Range Status   Specimen Description ABSCESS   Final   Special Requests NONE   Final   Culture     Final   Value: STAPHYLOCOCCUS AUREUS     Note: No MRSA Isolated     Performed at [facility]   Report Status 12/31/2013 FINAL   Final  CULTURE, BLOOD (ROUTINE X 2)     Status: None   Collection Time    12/29/13  9:54 PM      Result Value Ref Range Status   Specimen Description BLOOD LEFT ANTECUBITAL   Final   Special Requests BOTTLES DRAWN AEROBIC ONLY 5ML   Final   Culture  Setup Time     Final   Value: 12/30/2013 00:54     Performed at [facility]   Culture     Final   Value:  BLOOD CULTURE RECEIVED NO GROWTH TO DATE CULTURE WILL BE HELD FOR 5 DAYS BEFORE ISSUING A FINAL NEGATIVE REPORT     Performed at [facility]   Report Status PENDING   Incomplete     Labs: Basic Metabolic Panel:  Recent Labs Lab 12/29/13 1724 12/30/13 0430 12/31/13 0508 01/01/14 0504 01/02/14 0405 01/03/14 0440  NA 144 147 141 143 145 144  K 2.5* 2.8* 2.9* 3.3* 4.2 4.1  CL 104 107 105 105 108 107  CO2 33* 31 29 31 30 28   GLUCOSE 86 91 167* 120* 94 101*  BUN 15 12 8 7 7 8   CREATININE 0.93 0.98 0.87 0.95 0.94 1.03  CALCIUM 8.7 8.5 8.6 8.4 8.5 9.0  MG  --  1.9  --   --   --   --    Liver Function Tests:  Recent Labs Lab 12/29/13 1724 12/30/13 0430  AST 123* 107*  ALT 48* 42*  ALKPHOS 143* 130*  BILITOT 0.8  0.7  PROT 6.7 6.2  ALBUMIN 2.4* 2.2*    Recent Labs Lab 12/29/13 1724  LIPASE 37    Recent Labs Lab 12/29/13 1724  AMMONIA 83*   CBC:  Recent Labs Lab 12/30/13 0430 12/31/13 0508 01/01/14 0504 01/02/14 0405 01/03/14 0440  WBC 2.3* 1.9* 1.7* 1.7* 2.0*  HGB 9.8* 9.7* 8.9* 9.1* 9.0*  HCT 30.3* 28.4* 27.6* 29.4* 29.0*  MCV 91.8 92.8 93.2 95.5 96.0  PLT 50* 48* 45* 44* 48*   Cardiac Enzymes: No results found for this basename: CKTOTAL, CKMB, CKMBINDEX, TROPONINI,  in the last 168 hours BNP: BNP (last 3 results) No results found for this basename: PROBNP,  in the last 8760 hours CBG:  Recent Labs Lab 12/30/13 0703 12/31/13 0734 01/01/14 0707 01/02/14 0745  GLUCAP 106* 115* 82 134*    Time coordinating discharge: Over 30 minutes

## 2014-01-03 NOTE — Discharge Instructions (Signed)
Cellulitis Cellulitis is an infection of the skin and the tissue beneath it. The infected area is usually red and tender. Cellulitis occurs most often in the arms and lower legs.  CAUSES  Cellulitis is caused by bacteria that enter the skin through cracks or cuts in the skin. The most common types of bacteria that cause cellulitis are Staphylococcus and Streptococcus. SYMPTOMS   Redness and warmth.  Swelling.  Tenderness or pain.  Fever. DIAGNOSIS  Your caregiver can usually determine what is wrong based on a physical exam. Blood tests may also be done. TREATMENT  Treatment usually involves taking an antibiotic medicine. HOME CARE INSTRUCTIONS   Take your antibiotics as directed. Finish them even if you start to feel better.  Keep the infected arm or leg elevated to reduce swelling.  Apply a warm cloth to the affected area up to 4 times per day to relieve pain.  Only take over-the-counter or prescription medicines for pain, discomfort, or fever as directed by your caregiver.  Keep all follow-up appointments as directed by your caregiver. SEEK MEDICAL CARE IF:   You notice red streaks coming from the infected area.  Your red area gets larger or turns dark in color.  Your bone or joint underneath the infected area becomes painful after the skin has healed.  Your infection returns in the same area or another area.  You notice a swollen bump in the infected area.  You develop new symptoms. SEEK IMMEDIATE MEDICAL CARE IF:   You have a fever.  You feel very sleepy.  You develop vomiting or diarrhea.  You have a general ill feeling (malaise) with muscle aches and pains. MAKE SURE YOU:   Understand these instructions.  Will watch your condition.  Will get help right away if you are not doing well or get worse. Document Released: 04/16/2005 Document Revised: 01/06/2012 Document Reviewed: 09/22/2011 Exit[employer] Patient Information 2014 [employer].  

## 2014-01-05 ENCOUNTER — Inpatient Hospital Stay: Admission: RE | Admit: 2014-01-05 | Payer: Self-pay | Source: Ambulatory Visit

## 2014-01-05 LAB — CULTURE, BLOOD (ROUTINE X 2)
CULTURE: NO GROWTH
Culture: NO GROWTH

## 2014-01-11 ENCOUNTER — Telehealth: Payer: Self-pay | Admitting: *Deleted

## 2014-01-11 ENCOUNTER — Inpatient Hospital Stay: Admission: RE | Admit: 2014-01-11 | Payer: Self-pay | Source: Ambulatory Visit

## 2014-01-11 NOTE — Telephone Encounter (Signed)
[facility] called to inform Dr Alvy Bimler that patient has been a no show twice for her consultation appointment. They contacted patient this morning and patient stated she did not feel well and that is why she was a no show. [facility] will not be rescheduling at this time due to 2 no shows.Marland KitchenMarland Kitchen

## 2014-01-12 ENCOUNTER — Telehealth: Payer: Self-pay | Admitting: *Deleted

## 2014-01-12 NOTE — Telephone Encounter (Signed)
Pt called and left message requesting refill of Oxycodone.  Called pt back and was informed that pt was given Oxycodone for pain on 01/03/14 .  Pt stated she needed refills.  Pt was not coherent and unable to answer nurse appropriately.  Pt stated her son Tillman Abide [facility] also on the phone with someone in this office asking for pain med refill. Noted that pt was given Oxycodone when pt was discharged from the hospital on 01/03/14.  Instructed pt to have son call nurse for further info. Spoke with son Tillman Abide.  Informed [name] that pt's pain will be managed by her PCP, not by this clinic as per Dr. Calton Dach instructions. [name] acknowledged that pt had missed appts with radiology - due to pt not fully comprehend any instructions and  [name] has to take pt to her appts.  [name] voiced understanding and stated he would contact Dr. Leontine Locket office. [name]'  Phone     [phone].

## 2014-01-13 ENCOUNTER — Telehealth: Payer: Self-pay | Admitting: *Deleted

## 2014-01-13 NOTE — Telephone Encounter (Signed)
Spoke with [facility] @ Dr. Leontine Locket office.  Informed Dorothy Burns that pt had called and requested refill pain med Oxycodone from our office.  Dr. Alvy Bimler did not prescribe this med for pt .  Informed Dorothy Burns that pt had missed 2 scheduled appts with radiology for liver ablation as ordered by Dr. Alvy Bimler.  Informed Dorothy Burns that per Dr. Alvy Bimler, pt's pain management will be done by her primary office, and not by our office since pt did not follow through with md's recommended plan of care.  Faxed last office notes and the failed appts notes from [place], RN to [place] for Dr. [name] to review.  [name] voiced understanding. [name]'s  Phone   [phone]   ;    Fax    [phone].

## 2014-01-16 ENCOUNTER — Encounter (HOSPITAL_COMMUNITY): Payer: Self-pay | Admitting: Emergency Medicine

## 2014-01-16 ENCOUNTER — Emergency Department (HOSPITAL_COMMUNITY)
Admission: EM | Admit: 2014-01-16 | Discharge: 2014-01-16 | Discharge status: Against Medical Advice | Payer: Medicare Other | Attending: Emergency Medicine | Admitting: Emergency Medicine

## 2014-01-16 DIAGNOSIS — F17200 Nicotine dependence, unspecified, uncomplicated: Secondary | ICD-10-CM | POA: Diagnosis not present

## 2014-01-16 DIAGNOSIS — J449 Chronic obstructive pulmonary disease, unspecified: Secondary | ICD-10-CM | POA: Insufficient documentation

## 2014-01-16 DIAGNOSIS — Z4800 Encounter for change or removal of nonsurgical wound dressing: Secondary | ICD-10-CM | POA: Insufficient documentation

## 2014-01-16 DIAGNOSIS — J4489 Other specified chronic obstructive pulmonary disease: Secondary | ICD-10-CM | POA: Insufficient documentation

## 2014-01-16 DIAGNOSIS — I10 Essential (primary) hypertension: Secondary | ICD-10-CM | POA: Insufficient documentation
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
## 2014-01-16 NOTE — ED Notes (Signed)
Pt noted to be walking out of ED, stopped by registration and she told them that she was leaving and gave them her pager and patient labels. Pt seen exiting ED, this RN notified after.

## 2014-01-25 ENCOUNTER — Ambulatory Visit
Admission: RE | Admit: 2014-01-25 | Discharge: 2014-01-25 | Disposition: A | Discharge status: Home / Self Care | Payer: Medicare Other | Source: Ambulatory Visit | Attending: Hematology and Oncology | Admitting: Hematology and Oncology

## 2014-01-25 DIAGNOSIS — C220 Liver cell carcinoma: Secondary | ICD-10-CM

## 2014-01-26 ENCOUNTER — Other Ambulatory Visit (HOSPITAL_COMMUNITY): Payer: Self-pay | Admitting: Interventional Radiology

## 2014-01-26 DIAGNOSIS — C220 Liver cell carcinoma: Secondary | ICD-10-CM

## 2014-01-26 DIAGNOSIS — K7030 Alcoholic cirrhosis of liver without ascites: Secondary | ICD-10-CM

## 2014-02-24 ENCOUNTER — Other Ambulatory Visit: Payer: Self-pay

## 2014-02-24 ENCOUNTER — Ambulatory Visit: Payer: Self-pay | Admitting: Hematology and Oncology

## 2014-03-14 ENCOUNTER — Ambulatory Visit
Admission: RE | Admit: 2014-03-14 | Discharge: 2014-03-14 | Disposition: A | Discharge status: Home / Self Care | Payer: Medicare Other | Source: Ambulatory Visit | Attending: Interventional Radiology | Admitting: Interventional Radiology

## 2014-03-14 VITALS — BP 114/73 | HR 14 | Temp 97.6°F | Resp 14

## 2014-03-14 DIAGNOSIS — K7030 Alcoholic cirrhosis of liver without ascites: Secondary | ICD-10-CM

## 2014-03-14 DIAGNOSIS — C220 Liver cell carcinoma: Secondary | ICD-10-CM

## 2014-03-15 ENCOUNTER — Other Ambulatory Visit (HOSPITAL_COMMUNITY): Payer: Self-pay | Admitting: Interventional Radiology

## 2014-03-15 DIAGNOSIS — K7030 Alcoholic cirrhosis of liver without ascites: Secondary | ICD-10-CM

## 2014-03-15 DIAGNOSIS — C228 Malignant neoplasm of liver, primary, unspecified as to type: Secondary | ICD-10-CM

## 2014-03-21 ENCOUNTER — Other Ambulatory Visit: Payer: Self-pay | Admitting: Radiology

## 2014-03-21 ENCOUNTER — Encounter (HOSPITAL_COMMUNITY): Payer: Self-pay | Admitting: Pharmacy Technician

## 2014-03-22 ENCOUNTER — Other Ambulatory Visit: Payer: Self-pay | Admitting: Radiology

## 2014-03-23 ENCOUNTER — Observation Stay (HOSPITAL_COMMUNITY)
Admission: RE | Admit: 2014-03-23 | Discharge: 2014-03-24 | Disposition: A | Discharge status: Home / Self Care | Payer: Medicare Other | Source: Ambulatory Visit | Attending: Interventional Radiology | Admitting: Interventional Radiology

## 2014-03-23 ENCOUNTER — Other Ambulatory Visit (HOSPITAL_COMMUNITY): Payer: Self-pay | Admitting: Interventional Radiology

## 2014-03-23 ENCOUNTER — Encounter (HOSPITAL_COMMUNITY): Payer: Self-pay

## 2014-03-23 ENCOUNTER — Ambulatory Visit (HOSPITAL_COMMUNITY)
Admission: RE | Admit: 2014-03-23 | Payer: Medicare Other | Source: Ambulatory Visit | Admitting: Interventional Radiology

## 2014-03-23 VITALS — BP 120/71 | HR 97 | Temp 97.8°F | Resp 15 | Ht 63.0 in | Wt 140.0 lb

## 2014-03-23 DIAGNOSIS — F319 Bipolar disorder, unspecified: Secondary | ICD-10-CM | POA: Diagnosis not present

## 2014-03-23 DIAGNOSIS — J449 Chronic obstructive pulmonary disease, unspecified: Secondary | ICD-10-CM | POA: Diagnosis not present

## 2014-03-23 DIAGNOSIS — C228 Malignant neoplasm of liver, primary, unspecified as to type: Principal | ICD-10-CM | POA: Insufficient documentation

## 2014-03-23 DIAGNOSIS — F1020 Alcohol dependence, uncomplicated: Secondary | ICD-10-CM | POA: Insufficient documentation

## 2014-03-23 DIAGNOSIS — C220 Liver cell carcinoma: Secondary | ICD-10-CM

## 2014-03-23 DIAGNOSIS — K219 Gastro-esophageal reflux disease without esophagitis: Secondary | ICD-10-CM | POA: Insufficient documentation

## 2014-03-23 DIAGNOSIS — I10 Essential (primary) hypertension: Secondary | ICD-10-CM | POA: Insufficient documentation

## 2014-03-23 DIAGNOSIS — B1920 Unspecified viral hepatitis C without hepatic coma: Secondary | ICD-10-CM | POA: Insufficient documentation

## 2014-03-23 DIAGNOSIS — K7030 Alcoholic cirrhosis of liver without ascites: Secondary | ICD-10-CM | POA: Diagnosis not present

## 2014-03-23 DIAGNOSIS — J4489 Other specified chronic obstructive pulmonary disease: Secondary | ICD-10-CM | POA: Insufficient documentation

## 2014-03-23 HISTORY — PX: OTHER SURGICAL HISTORY: SHX169

## 2014-03-23 LAB — RAPID URINE DRUG SCREEN, HOSP PERFORMED
AMPHETAMINES: NOT DETECTED
Barbiturates: NOT DETECTED
Benzodiazepines: NOT DETECTED
COCAINE: NOT DETECTED
OPIATES: NOT DETECTED
TETRAHYDROCANNABINOL: NOT DETECTED

## 2014-03-23 LAB — APTT: aPTT: 33 seconds (ref 24–37)

## 2014-03-23 LAB — COMPREHENSIVE METABOLIC PANEL
ALT: 30 U/L (ref 0–35)
AST: 124 U/L — AB (ref 0–37)
Albumin: 3 g/dL — ABNORMAL LOW (ref 3.5–5.2)
Alkaline Phosphatase: 152 U/L — ABNORMAL HIGH (ref 39–117)
Anion gap: 13 (ref 5–15)
BUN: 13 mg/dL (ref 6–23)
CALCIUM: 9.1 mg/dL (ref 8.4–10.5)
CO2: 30 mEq/L (ref 19–32)
Chloride: 96 mEq/L (ref 96–112)
Creatinine, Ser: 1.15 mg/dL — ABNORMAL HIGH (ref 0.50–1.10)
GFR calc non Af Amer: 51 mL/min — ABNORMAL LOW (ref >90)
GFR, EST AFRICAN AMERICAN: 60 mL/min — AB (ref >90)
Glucose, Bld: 83 mg/dL (ref 70–99)
Potassium: 3.5 mEq/L — ABNORMAL LOW (ref 3.7–5.3)
Sodium: 139 mEq/L (ref 137–147)
TOTAL PROTEIN: 7.5 g/dL (ref 6.0–8.3)
Total Bilirubin: 0.8 mg/dL (ref 0.3–1.2)

## 2014-03-23 LAB — CBC WITH DIFFERENTIAL/PLATELET
BASOS ABS: 0 10*3/uL (ref 0.0–0.1)
Basophils Relative: 0 % (ref 0–1)
EOS ABS: 0.1 10*3/uL (ref 0.0–0.7)
Eosinophils Relative: 3 % (ref 0–5)
HCT: 37.7 % (ref 36.0–46.0)
Hemoglobin: 12.9 g/dL (ref 12.0–15.0)
Lymphocytes Relative: 38 % (ref 12–46)
Lymphs Abs: 1 10*3/uL (ref 0.7–4.0)
MCH: 31.3 pg (ref 26.0–34.0)
MCHC: 34.2 g/dL (ref 30.0–36.0)
MCV: 91.5 fL (ref 78.0–100.0)
Monocytes Absolute: 0.5 10*3/uL (ref 0.1–1.0)
Monocytes Relative: 18 % — ABNORMAL HIGH (ref 3–12)
Neutro Abs: 1.1 10*3/uL — ABNORMAL LOW (ref 1.7–7.7)
Neutrophils Relative %: 41 % — ABNORMAL LOW (ref 43–77)
PLATELETS: 72 10*3/uL — AB (ref 150–400)
RBC: 4.12 MIL/uL (ref 3.87–5.11)
RDW: 15.1 % (ref 11.5–15.5)
WBC: 2.7 10*3/uL — ABNORMAL LOW (ref 4.0–10.5)

## 2014-03-23 LAB — PROTIME-INR
INR: 1.24 (ref 0.00–1.49)
Prothrombin Time: 15.6 seconds — ABNORMAL HIGH (ref 11.6–15.2)

## 2014-03-23 MED ORDER — SODIUM CHLORIDE 0.9 % IV SOLN
INTRAVENOUS | Status: DC
  Administered 2014-03-23: 500 mL via INTRAVENOUS

## 2014-03-23 MED ORDER — ONDANSETRON HCL 4 MG/2ML IJ SOLN: 4.0000 mg | Freq: Four times a day (QID) | INTRAMUSCULAR | Status: DC | PRN

## 2014-03-23 MED ORDER — FENTANYL CITRATE 0.05 MG/ML IJ SOLN
INTRAMUSCULAR | Status: AC
  Filled 2014-03-23: qty 6

## 2014-03-23 MED ORDER — PREGABALIN 75 MG PO CAPS
75.0000 mg | ORAL_CAPSULE | Freq: Two times a day (BID) | ORAL | Status: DC
  Administered 2014-03-23 – 2014-03-24 (×2): 75 mg via ORAL
  Filled 2014-03-23 (×2): qty 1

## 2014-03-23 MED ORDER — IOHEXOL 300 MG/ML  SOLN: 80.0000 mL | Freq: Once | INTRAMUSCULAR | Status: AC | PRN

## 2014-03-23 MED ORDER — PANTOPRAZOLE SODIUM 40 MG IV SOLR
40.0000 mg | Freq: Once | INTRAVENOUS | Status: AC
  Administered 2014-03-23: 40 mg via INTRAVENOUS
  Filled 2014-03-23: qty 40

## 2014-03-23 MED ORDER — LIDOCAINE HCL 1 % IJ SOLN
INTRAMUSCULAR | Status: AC
  Filled 2014-03-23: qty 20

## 2014-03-23 MED ORDER — DIPHENHYDRAMINE HCL 12.5 MG/5ML PO ELIX: 12.5000 mg | ORAL_SOLUTION | Freq: Four times a day (QID) | ORAL | Status: DC | PRN

## 2014-03-23 MED ORDER — PANTOPRAZOLE SODIUM 40 MG PO TBEC
40.0000 mg | DELAYED_RELEASE_TABLET | Freq: Every day | ORAL | Status: DC
  Administered 2014-03-24: 40 mg via ORAL
  Filled 2014-03-23: qty 1

## 2014-03-23 MED ORDER — BUDESONIDE-FORMOTEROL FUMARATE 160-4.5 MCG/ACT IN AERO
2.0000 | INHALATION_SPRAY | Freq: Two times a day (BID) | RESPIRATORY_TRACT | Status: DC
  Administered 2014-03-23: 2 via RESPIRATORY_TRACT
  Filled 2014-03-23: qty 6

## 2014-03-23 MED ORDER — COLCHICINE 0.6 MG PO TABS
0.6000 mg | ORAL_TABLET | Freq: Every day | ORAL | Status: DC
  Administered 2014-03-24: 0.6 mg via ORAL
  Filled 2014-03-23: qty 1

## 2014-03-23 MED ORDER — HYDROCHLOROTHIAZIDE 25 MG PO TABS
25.0000 mg | ORAL_TABLET | Freq: Every evening | ORAL | Status: DC
  Filled 2014-03-23: qty 1

## 2014-03-23 MED ORDER — CEFAZOLIN SODIUM-DEXTROSE 2-3 GM-% IV SOLR: 2.0000 g | INTRAVENOUS | Status: DC

## 2014-03-23 MED ORDER — GABAPENTIN 300 MG PO CAPS
300.0000 mg | ORAL_CAPSULE | Freq: Three times a day (TID) | ORAL | Status: DC
  Administered 2014-03-23 – 2014-03-24 (×2): 300 mg via ORAL
  Filled 2014-03-23 (×4): qty 1

## 2014-03-23 MED ORDER — MIDAZOLAM HCL 2 MG/2ML IJ SOLN
INTRAMUSCULAR | Status: AC | PRN
  Administered 2014-03-23: 1 mg via INTRAVENOUS

## 2014-03-23 MED ORDER — ZOLPIDEM TARTRATE 5 MG PO TABS
5.0000 mg | ORAL_TABLET | Freq: Every day | ORAL | Status: DC
  Filled 2014-03-23: qty 1

## 2014-03-23 MED ORDER — MIDAZOLAM HCL 2 MG/2ML IJ SOLN
INTRAMUSCULAR | Status: AC
  Filled 2014-03-23: qty 6

## 2014-03-23 MED ORDER — ONDANSETRON HCL 4 MG/2ML IJ SOLN
4.0000 mg | Freq: Four times a day (QID) | INTRAMUSCULAR | Status: DC
  Administered 2014-03-23: 4 mg via INTRAVENOUS
  Filled 2014-03-23: qty 2

## 2014-03-23 MED ORDER — VANCOMYCIN HCL IN DEXTROSE 1-5 GM/200ML-% IV SOLN
1000.0000 mg | Freq: Once | INTRAVENOUS | Status: DC
  Filled 2014-03-23: qty 200

## 2014-03-23 MED ORDER — FUROSEMIDE 20 MG PO TABS
20.0000 mg | ORAL_TABLET | Freq: Every morning | ORAL | Status: DC
  Administered 2014-03-24: 20 mg via ORAL
  Filled 2014-03-23: qty 1

## 2014-03-23 MED ORDER — NALOXONE HCL 0.4 MG/ML IJ SOLN: 0.4000 mg | INTRAMUSCULAR | Status: DC | PRN

## 2014-03-23 MED ORDER — FENTANYL 10 MCG/ML IV SOLN
INTRAVENOUS | Status: DC
  Administered 2014-03-23: 20:00:00 via INTRAVENOUS
  Administered 2014-03-23: 30 ug via INTRAVENOUS
  Administered 2014-03-24: 60 ug via INTRAVENOUS
  Administered 2014-03-24 (×2): 45 ug via INTRAVENOUS
  Filled 2014-03-23 (×2): qty 50

## 2014-03-23 MED ORDER — SODIUM CHLORIDE 0.9 % IJ SOLN: 9.0000 mL | INTRAMUSCULAR | Status: DC | PRN

## 2014-03-23 MED ORDER — DIPHENHYDRAMINE HCL 50 MG/ML IJ SOLN: 12.5000 mg | Freq: Four times a day (QID) | INTRAMUSCULAR | Status: DC | PRN

## 2014-03-23 MED ORDER — KETOROLAC TROMETHAMINE 30 MG/ML IJ SOLN
30.0000 mg | Freq: Four times a day (QID) | INTRAMUSCULAR | Status: AC
Start: 2014-03-23 — End: 2014-03-24
  Administered 2014-03-23: 30 mg via INTRAVENOUS
  Filled 2014-03-23: qty 1

## 2014-03-23 MED ORDER — SODIUM CHLORIDE 0.9 % IV SOLN
INTRAVENOUS | Status: DC
  Administered 2014-03-23 – 2014-03-24 (×3): via INTRAVENOUS

## 2014-03-23 MED ORDER — DIVALPROEX SODIUM 250 MG PO DR TAB
250.0000 mg | DELAYED_RELEASE_TABLET | Freq: Two times a day (BID) | ORAL | Status: DC
  Administered 2014-03-23 – 2014-03-24 (×2): 250 mg via ORAL
  Filled 2014-03-23 (×3): qty 1

## 2014-03-23 MED ORDER — FENTANYL CITRATE 0.05 MG/ML IJ SOLN
INTRAMUSCULAR | Status: AC | PRN
  Administered 2014-03-23: 50 ug via INTRAVENOUS
  Administered 2014-03-23: 25 ug via INTRAVENOUS

## 2014-03-23 MED ORDER — DEXAMETHASONE SODIUM PHOSPHATE 10 MG/ML IJ SOLN
10.0000 mg | Freq: Once | INTRAMUSCULAR | Status: AC
  Administered 2014-03-23: 10 mg via INTRAVENOUS
  Filled 2014-03-23: qty 1

## 2014-03-23 NOTE — Progress Notes (Signed)
Foley Cath inserted as ordered and ua colleted and sent to lab. Family at bedside. Pt will awaken ask for something to eat and drink be reminded she is NPO for the procedure then fall asleep. Pt has done this twice in the last 20 minutes.

## 2014-03-23 NOTE — Progress Notes (Signed)
Dr Pascal Lux here to see patient. Pt is more alert than on arrival but still cannot state accurately what and when she took her medication. This nurse is proceed with IV and pre procedure labs. Pt was excorted to BR for ua but pt feel asleep on toilet and unable to give a urine sample

## 2014-03-23 NOTE — H&P (Signed)
Dorothy Burns is an 58 y.o. female.   Chief Complaint: liver cancer HPI: Patient with history of polysubstance abuse, hepatitis C, cirrhosis, elevated AFP and recent imaging compatible with [place] (nonsurgical candidate)presents today following IR consultation for elective bland hepatic arterial embolization.  Past Medical History  Diagnosis Date  . Hypertension   . Bipolar 1 disorder   . Menorrhagia   . Post-menopausal bleeding     MOST RECENT BLEEDING  WAS NOV 2013  . Paresthesia   . COPD (chronic obstructive pulmonary disease)   . 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 Gout   . Mental disorder     bipolar  . Asthma   . Shortness of breath   . Anemia   . GERD (gastroesophageal reflux disease)   . Gallstones     ABD PAIN, N&V  . Arthritis   . Headache(784.0)   . Hepatitis     Hep C x 10 yrs  - NO TREATMENT  . PONV (postoperative nausea and vomiting)   . Cirrhosis     Past Surgical History  Procedure Laterality Date  . Tubal ligation    . Facial reconstruction surgery    . Multiple tooth extractions    . Hysteroscopy w/d&c  09/29/2011    Procedure: DILATATION AND CURETTAGE /HYSTEROSCOPY;  Surgeon: Mora Bellman, MD;  Location: [facility];  Service: Gynecology;  Laterality: N/A;  . Cholecystectomy  08/20/2012    Procedure: LAPAROSCOPIC CHOLECYSTECTOMY;  Surgeon: Ralene Ok, MD;  Location: [facility];  Service: General;;    Family History  Problem Relation Age of Onset  . COPD Mother   . Diabetes Mother   . Heart disease Mother   . Diabetes Father   . Heart disease Father   . Hypertension Father   . Cancer Father     basal cell ca and prostate ca   Social History:  reports that she has been smoking Cigarettes.  She has a 7.5 pack-year smoking history. She has never used smokeless tobacco. She reports that she does not drink alcohol or use illicit drugs.  Allergies:  Allergies  Allergen Reactions  . Tylenol [Acetaminophen]     Liver issues    Current  outpatient prescriptions:oxycodone (OXY-IR) 5 MG capsule, Take 5 mg by mouth every 4 (four) hours as needed., Disp: , Rfl: ;  zolpidem (AMBIEN) 10 MG tablet, Take 10 mg by mouth at bedtime., Disp: , Rfl: ;  budesonide-formoterol (SYMBICORT) 160-4.5 MCG/ACT inhaler, Inhale 2 puffs into the lungs 2 (two) times daily., Disp: , Rfl: ;  clonazePAM (KLONOPIN) 2 MG tablet, Take 2 mg by mouth 2 (two) times daily., Disp: , Rfl:  colchicine 0.6 MG tablet, Take 0.6 mg by mouth daily., Disp: , Rfl: ;  Cyanocobalamin (B-12 PO), Take 1 capsule by mouth daily., Disp: , Rfl: ;  divalproex (DEPAKOTE) 250 MG DR tablet, Take 250 mg by mouth 2 (two) times daily., Disp: , Rfl: ;  furosemide (LASIX) 20 MG tablet, Take 20 mg by mouth every morning. , Disp: , Rfl: ;  gabapentin (NEURONTIN) 300 MG capsule, Take 300 mg by mouth 3 (three) times daily., Disp: , Rfl:  hydrochlorothiazide (HYDRODIURIL) 25 MG tablet, Take 25 mg by mouth every evening., Disp: , Rfl: ;  Multiple Vitamin (MULTIVITAMIN WITH MINERALS) TABS tablet, Take 1 tablet by mouth daily., Disp: , Rfl: ;  omeprazole (PRILOSEC) 20 MG capsule, Take 20 mg by mouth daily., Disp: , Rfl: ;  pregabalin (LYRICA) 75  MG capsule, Take 75 mg by mouth 2 (two) times daily., Disp: , Rfl: ;  Thiamine HCl (B-1 PO), Take 1 capsule by mouth daily., Disp: , Rfl:  traMADol (ULTRAM) 50 MG tablet, Take 50 mg by mouth every 6 (six) hours as needed for moderate pain., Disp: , Rfl:  Current facility-administered medications:0.9 %  sodium chloride infusion, , Intravenous, Continuous, D [name], PA-C, Last Rate: 75 mL/hr at 03/23/14 1035, 500 mL at 03/23/14 1035;  ceFAZolin (ANCEF) IVPB 2 g/50 mL premix, 2 g, Intravenous, On Call, D [name], PA-C   Results for orders placed during the hospital encounter of 03/23/14  URINE RAPID DRUG SCREEN (HOSP PERFORMED)      Result Value Ref Range   Opiates NONE DETECTED  NONE DETECTED   Cocaine NONE DETECTED  NONE DETECTED   Benzodiazepines NONE  DETECTED  NONE DETECTED   Amphetamines NONE DETECTED  NONE DETECTED   Tetrahydrocannabinol NONE DETECTED  NONE DETECTED   Barbiturates NONE DETECTED  NONE DETECTED  APTT      Result Value Ref Range   aPTT 33  24 - 37 seconds  CBC WITH DIFFERENTIAL      Result Value Ref Range   WBC 2.7 (*) 4.0 - 10.5 K/uL   RBC 4.12  3.87 - 5.11 MIL/uL   Hemoglobin 12.9  12.0 - 15.0 g/dL   HCT 37.7  36.0 - 46.0 %   MCV 91.5  78.0 - 100.0 fL   MCH 31.3  26.0 - 34.0 pg   MCHC 34.2  30.0 - 36.0 g/dL   RDW 15.1  11.5 - 15.5 %   Platelets 72 (*) 150 - 400 K/uL   Neutrophils Relative % 41 (*) 43 - 77 %   Neutro Abs 1.1 (*) 1.7 - 7.7 K/uL   Lymphocytes Relative 38  12 - 46 %   Lymphs Abs 1.0  0.7 - 4.0 K/uL   Monocytes Relative 18 (*) 3 - 12 %   Monocytes Absolute 0.5  0.1 - 1.0 K/uL   Eosinophils Relative 3  0 - 5 %   Eosinophils Absolute 0.1  0.0 - 0.7 K/uL   Basophils Relative 0  0 - 1 %   Basophils Absolute 0.0  0.0 - 0.1 K/uL  COMPREHENSIVE METABOLIC PANEL      Result Value Ref Range   Sodium 139  137 - 147 mEq/L   Potassium 3.5 (*) 3.7 - 5.3 mEq/L   Chloride 96  96 - 112 mEq/L   CO2 30  19 - 32 mEq/L   Glucose, Bld 83  70 - 99 mg/dL   BUN 13  6 - 23 mg/dL   Creatinine, Ser 1.15 (*) 0.50 - 1.10 mg/dL   Calcium 9.1  8.4 - 10.5 mg/dL   Total Protein 7.5  6.0 - 8.3 g/dL   Albumin 3.0 (*) 3.5 - 5.2 g/dL   AST 124 (*) 0 - 37 U/L   ALT 30  0 - 35 U/L   Alkaline Phosphatase 152 (*) 39 - 117 U/L   Total Bilirubin 0.8  0.3 - 1.2 mg/dL   GFR calc non Af Amer 51 (*) >90 mL/min   GFR calc Af Amer 60 (*) >90 mL/min   Anion gap 13  5 - 15  PROTIME-INR      Result Value Ref Range   Prothrombin Time 15.6 (*) 11.6 - 15.2 seconds   INR 1.24  0.00 - 1.49    Review of Systems  Constitutional:  Positive for weight loss. Negative for fever and chills.       Diminished appetite  Respiratory: Negative for cough, hemoptysis and shortness of breath.   Cardiovascular: Negative for chest pain.   Gastrointestinal: Negative for vomiting, blood in stool and melena.       Occ nausea and intermittent abd pain, occ diarrhea  Genitourinary: Negative for dysuria and hematuria.  Musculoskeletal: Negative for back pain.  Neurological: Negative for headaches.  Psychiatric/Behavioral: Positive for substance abuse.    Blood pressure 113/82, pulse 71, temperature 97.3 F (36.3 C), temperature source Oral, resp. rate 16, height 5\' 3"  (1.6 m), weight 140 lb (63.504 kg), last menstrual period 05/21/2012, SpO2 100.00%. Physical Exam  Constitutional: She is oriented to person, place, and time.  Thin WF appearing older than stated age; lethargic  Cardiovascular: Normal rate and regular rhythm.   Respiratory: Effort normal.  Distant BS bilat  GI: Soft. Bowel sounds are normal. She exhibits no distension. There is no tenderness.  Musculoskeletal: Normal range of motion. She exhibits no edema.  Neurological: She is alert and oriented to person, place, and time.     Assessment/Plan Patient with history of polysubstance abuse, hepatitis C, cirrhosis, elevated AFP and recent imaging compatible with [place] (nonsurgical candidate)presents today following IR consultation for elective bland hepatic arterial embolization. Details/risks of procedure d/w pt/daughter/father with their understanding and consent. Following procedure the pt will be admitted for overnight observation for hemodynamic monitoring/pain control.    Doll Frazee,D KEVIN 03/23/2014, 10:48 AM

## 2014-03-23 NOTE — Progress Notes (Addendum)
Pt arrived ambulatory to short stay accompanied by daughter and patients father. Pt was assisted to stretcher but she is very lethargic. Pt is unable to stay focused on questions and unable to state exactly what medication she has taken prior to to arrival today. Patient's father  stated that when he arrived to her apartment he had to get the maintenance man to unlock door because she would not answer. Pt was able to dress herself and they come to hospital where they met daughter before coming to radiology . [name] PA for radiology was her and saw patient and will be talking with Dr Pascal Lux about patient.

## 2014-03-23 NOTE — Progress Notes (Signed)
Pt much more awake at this time. Seem by [name] PA radiology. Pt is eager to go to radiology for procedure and then get something to eat. Family has been shown to Radiology Waiting room

## 2014-03-23 NOTE — Procedures (Signed)
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  No immediate complications.  Keep right leg straight for 4 hrs.

## 2014-03-23 NOTE — Progress Notes (Addendum)
Day of Surgery  Subjective: Pt drowsy but arousable; without new c/o; BP a little soft; pt hungry  Objective: Vital signs in last 24 hours: Temp:  [97.2 F (36.2 C)-97.8 F (36.6 C)] 97.5 F (36.4 C) (09/03 1750) Pulse Rate:  [48-94] 53 (09/03 1750) Resp:  [11-23] 14 (09/03 1750) BP: (71-113)/(50-82) 85/57 mmHg (09/03 1750) SpO2:  [92 %-100 %] 100 % (09/03 1750) Weight:  [140 lb (63.504 kg)] 140 lb (63.504 kg) (09/03 0957) Last BM Date:  (unknown)  Intake/Output from previous day:   Intake/Output this shift: Total I/O In: -  Out: 75 [Urine:75]  abd soft,NT; rt CFA puncture site clean and dry,NT, no hematoma  Lab Results:   Recent Labs  03/23/14 1030  WBC 2.7*  HGB 12.9  HCT 37.7  PLT 72*   BMET  Recent Labs  03/23/14 1030  NA 139  K 3.5*  CL 96  CO2 30  GLUCOSE 83  BUN 13  CREATININE 1.15*  CALCIUM 9.1   PT/INR  Recent Labs  03/23/14 1030  LABPROT 15.6*  INR 1.24   ABG No results found for this basename: PHART, PCO2, PO2, HCO3,  in the last 72 hours  Studies/Results: Ir Angiogram Visceral Selective  03/23/2014   INDICATION: History of hepatitis, polysubstance abuse, alcoholism and cirrhosis and known multifocal hepatic cellular carcinoma. Patient presents today for bland embolization of the dominant lesion within the left lobe of the liver.  EXAM: 1. ULTRASOUND GUIDANCE FOR ARTERIAL ACCESS. 2. CELIAC AND SUPERIOR MESENTERIC ARTERIOGRAMS (1st ORDER) 3. LEFT LATERAL SEGMENTAL HEPATIC ARTERIOGRAM (3rd ORDER) 4. FLUOROSCOPIC GUIDED BLAND EMBOLIZATION OF THE LEFT LATERAL SEGMENTAL HEPATIC ARTERY.  COMPARISON:  CTA of the chest, abdomen and pelvis - 12/22/2013  MEDICATIONS: Versed 1 mg IV; Fentanyl 75 mcg IV; Vancomycin 1 g IV  Sedation time: 45 minutes  CONTRAST:  75 cc Omnipaque 300  FLUOROSCOPY TIME:  10 minutes.  30 seconds.  COMPLICATIONS: None immediate  TECHNIQUE: Informed written consent was obtained from the patient after a discussion of the risks,  benefits and alternatives to treatment. Questions regarding the procedure were encouraged and answered. A timeout was performed prior to the initiation of the procedure.  The right groin was prepped and draped in the usual sterile fashion, and a sterile drape was applied covering the operative field. Maximum barrier sterile technique with sterile gowns and gloves were used for the procedure. A timeout was performed prior to the initiation of the procedure. Local anesthesia was provided with 1% lidocaine.  The right femoral head was marked fluoroscopically. Under ultrasound guidance, the right common femoral artery was accessed with a micropuncture kit after the overlying soft tissues were anesthetized with 1% lidocaine. An ultrasound image was saved for documentation purposes. The micropuncture sheath was exchanged for a 5 [place] vascular sheath over a Bentson wire. A closure arteriogram was performed through the side of the sheath confirming access within the right common femoral artery.  Over a Bentson wire, a Mickelson catheter was advanced to the level of the thoracic aorta where it was back bled and flushed. The catheter was then utilized to select the superior mesenteric arch artery and a superior mesenteric arteriogram with delayed portal venous phase imaging was acquired.  The Mickelson within advance cranially and utilized to select the celiac artery and a celiac arteriogram was performed.  With the use of a Fathom micro wire, a high-flow Renegade micro catheter was utilized to select the left lateral segmental hepatic artery and a left lateral  segmental hepatic arteriogram was performed. Appropriate embolization positioning was confirmed within the peripheral aspect of the vessel and the dominant hypervascular mass within the peripheral aspect of the lateral segment of the left lobe of the liver was embolized with approximately 1 vial of 100-300 micron Embospheres.  The micro catheter was slightly  withdrawn into the more proximal aspect of the left lateral hepatic artery and post embolization arteriogram was performed.  Images were reviewed and the procedure was terminated. All wires catheters and sheaths were removed from the patient. Hemostasis was achieved at the right groin access site with manual compression. A dressing was placed. The patient tolerated the procedure well without immediate postprocedural complication.  FINDINGS: Initial superior mesenteric arteriogram failed to delineate definitive replaced or accessory supply to the hepatic parenchyma. Delayed portal venous phase imaging confirmed patency of the main, right and left portal veins.  Celiac arteriogram demonstrates separate origins of the left medial and lateral segmental hepatic arteries. Otherwise, conventional branch branching pattern of the celiac trunk is demonstrated  The left lateral segmental hepatic artery was selected with sub selective angiogram demonstrating perfusion into the dominant mass within the peripheral aspect of the lateral segment of the left lobe of the liver as was demonstrated on preceding CTA. Appropriate embolization positioning was confirmed within the peripheral aspect of this vessel and bland embolization was performed with 100 to 300 Embospheres.  Post embolization left lateral segmental hepatic arteriogram demonstrates near complete stasis of the vessel with no residual perfusion of the known dominant hypervascular mass.  IMPRESSION: Technically successful bland embolization of the dominant hypervascular mass within the peripheral aspect of the left lobe of the liver.  PLAN: The patient will return to interventional radiology clinic in approximately 1 month. A CMP will be obtained at that time and the patient will be evaluated to ensure complete recovery from this initial procedure prior to proceeding with bland embolization of the additional known lesion within the right lobe of the liver.   Electronically  Signed   By: Sandi Mariscal M.D.   On: 03/23/2014 17:23   Ir Angiogram Visceral Selective  03/23/2014   INDICATION: History of hepatitis, polysubstance abuse, alcoholism and cirrhosis and known multifocal hepatic cellular carcinoma. Patient presents today for bland embolization of the dominant lesion within the left lobe of the liver.  EXAM: 1. ULTRASOUND GUIDANCE FOR ARTERIAL ACCESS. 2. CELIAC AND SUPERIOR MESENTERIC ARTERIOGRAMS (1st ORDER) 3. LEFT LATERAL SEGMENTAL HEPATIC ARTERIOGRAM (3rd ORDER) 4. FLUOROSCOPIC GUIDED BLAND EMBOLIZATION OF THE LEFT LATERAL SEGMENTAL HEPATIC ARTERY.  COMPARISON:  CTA of the chest, abdomen and pelvis - 12/22/2013  MEDICATIONS: Versed 1 mg IV; Fentanyl 75 mcg IV; Vancomycin 1 g IV  Sedation time: 45 minutes  CONTRAST:  75 cc Omnipaque 300  FLUOROSCOPY TIME:  10 minutes.  30 seconds.  COMPLICATIONS: None immediate  TECHNIQUE: Informed written consent was obtained from the patient after a discussion of the risks, benefits and alternatives to treatment. Questions regarding the procedure were encouraged and answered. A timeout was performed prior to the initiation of the procedure.  The right groin was prepped and draped in the usual sterile fashion, and a sterile drape was applied covering the operative field. Maximum barrier sterile technique with sterile gowns and gloves were used for the procedure. A timeout was performed prior to the initiation of the procedure. Local anesthesia was provided with 1% lidocaine.  The right femoral head was marked fluoroscopically. Under ultrasound guidance, the right common femoral artery was accessed with a  micropuncture kit after the overlying soft tissues were anesthetized with 1% lidocaine. An ultrasound image was saved for documentation purposes. The micropuncture sheath was exchanged for a 5 [place] vascular sheath over a Bentson wire. A closure arteriogram was performed through the side of the sheath confirming access within the right common  femoral artery.  Over a Bentson wire, a Mickelson catheter was advanced to the level of the thoracic aorta where it was back bled and flushed. The catheter was then utilized to select the superior mesenteric arch artery and a superior mesenteric arteriogram with delayed portal venous phase imaging was acquired.  The Mickelson within advance cranially and utilized to select the celiac artery and a celiac arteriogram was performed.  With the use of a Fathom micro wire, a high-flow Renegade micro catheter was utilized to select the left lateral segmental hepatic artery and a left lateral segmental hepatic arteriogram was performed. Appropriate embolization positioning was confirmed within the peripheral aspect of the vessel and the dominant hypervascular mass within the peripheral aspect of the lateral segment of the left lobe of the liver was embolized with approximately 1 vial of 100-300 micron Embospheres.  The micro catheter was slightly withdrawn into the more proximal aspect of the left lateral hepatic artery and post embolization arteriogram was performed.  Images were reviewed and the procedure was terminated. All wires catheters and sheaths were removed from the patient. Hemostasis was achieved at the right groin access site with manual compression. A dressing was placed. The patient tolerated the procedure well without immediate postprocedural complication.  FINDINGS: Initial superior mesenteric arteriogram failed to delineate definitive replaced or accessory supply to the hepatic parenchyma. Delayed portal venous phase imaging confirmed patency of the main, right and left portal veins.  Celiac arteriogram demonstrates separate origins of the left medial and lateral segmental hepatic arteries. Otherwise, conventional branch branching pattern of the celiac trunk is demonstrated  The left lateral segmental hepatic artery was selected with sub selective angiogram demonstrating perfusion into the dominant mass  within the peripheral aspect of the lateral segment of the left lobe of the liver as was demonstrated on preceding CTA. Appropriate embolization positioning was confirmed within the peripheral aspect of this vessel and bland embolization was performed with 100 to 300 Embospheres.  Post embolization left lateral segmental hepatic arteriogram demonstrates near complete stasis of the vessel with no residual perfusion of the known dominant hypervascular mass.  IMPRESSION: Technically successful bland embolization of the dominant hypervascular mass within the peripheral aspect of the left lobe of the liver.  PLAN: The patient will return to interventional radiology clinic in approximately 1 month. A CMP will be obtained at that time and the patient will be evaluated to ensure complete recovery from this initial procedure prior to proceeding with bland embolization of the additional known lesion within the right lobe of the liver.   Electronically Signed   By: Sandi Mariscal M.D.   On: 03/23/2014 17:23   Ir Angiogram Selective Each Additional Vessel  03/23/2014   INDICATION: History of hepatitis, polysubstance abuse, alcoholism and cirrhosis and known multifocal hepatic cellular carcinoma. Patient presents today for bland embolization of the dominant lesion within the left lobe of the liver.  EXAM: 1. ULTRASOUND GUIDANCE FOR ARTERIAL ACCESS. 2. CELIAC AND SUPERIOR MESENTERIC ARTERIOGRAMS (1st ORDER) 3. LEFT LATERAL SEGMENTAL HEPATIC ARTERIOGRAM (3rd ORDER) 4. FLUOROSCOPIC GUIDED BLAND EMBOLIZATION OF THE LEFT LATERAL SEGMENTAL HEPATIC ARTERY.  COMPARISON:  CTA of the chest, abdomen and pelvis - 12/22/2013  MEDICATIONS:  Versed 1 mg IV; Fentanyl 75 mcg IV; Vancomycin 1 g IV  Sedation time: 45 minutes  CONTRAST:  75 cc Omnipaque 300  FLUOROSCOPY TIME:  10 minutes.  30 seconds.  COMPLICATIONS: None immediate  TECHNIQUE: Informed written consent was obtained from the patient after a discussion of the risks, benefits and  alternatives to treatment. Questions regarding the procedure were encouraged and answered. A timeout was performed prior to the initiation of the procedure.  The right groin was prepped and draped in the usual sterile fashion, and a sterile drape was applied covering the operative field. Maximum barrier sterile technique with sterile gowns and gloves were used for the procedure. A timeout was performed prior to the initiation of the procedure. Local anesthesia was provided with 1% lidocaine.  The right femoral head was marked fluoroscopically. Under ultrasound guidance, the right common femoral artery was accessed with a micropuncture kit after the overlying soft tissues were anesthetized with 1% lidocaine. An ultrasound image was saved for documentation purposes. The micropuncture sheath was exchanged for a 5 [place] vascular sheath over a Bentson wire. A closure arteriogram was performed through the side of the sheath confirming access within the right common femoral artery.  Over a Bentson wire, a Mickelson catheter was advanced to the level of the thoracic aorta where it was back bled and flushed. The catheter was then utilized to select the superior mesenteric arch artery and a superior mesenteric arteriogram with delayed portal venous phase imaging was acquired.  The Mickelson within advance cranially and utilized to select the celiac artery and a celiac arteriogram was performed.  With the use of a Fathom micro wire, a high-flow Renegade micro catheter was utilized to select the left lateral segmental hepatic artery and a left lateral segmental hepatic arteriogram was performed. Appropriate embolization positioning was confirmed within the peripheral aspect of the vessel and the dominant hypervascular mass within the peripheral aspect of the lateral segment of the left lobe of the liver was embolized with approximately 1 vial of 100-300 micron Embospheres.  The micro catheter was slightly withdrawn into the  more proximal aspect of the left lateral hepatic artery and post embolization arteriogram was performed.  Images were reviewed and the procedure was terminated. All wires catheters and sheaths were removed from the patient. Hemostasis was achieved at the right groin access site with manual compression. A dressing was placed. The patient tolerated the procedure well without immediate postprocedural complication.  FINDINGS: Initial superior mesenteric arteriogram failed to delineate definitive replaced or accessory supply to the hepatic parenchyma. Delayed portal venous phase imaging confirmed patency of the main, right and left portal veins.  Celiac arteriogram demonstrates separate origins of the left medial and lateral segmental hepatic arteries. Otherwise, conventional branch branching pattern of the celiac trunk is demonstrated  The left lateral segmental hepatic artery was selected with sub selective angiogram demonstrating perfusion into the dominant mass within the peripheral aspect of the lateral segment of the left lobe of the liver as was demonstrated on preceding CTA. Appropriate embolization positioning was confirmed within the peripheral aspect of this vessel and bland embolization was performed with 100 to 300 Embospheres.  Post embolization left lateral segmental hepatic arteriogram demonstrates near complete stasis of the vessel with no residual perfusion of the known dominant hypervascular mass.  IMPRESSION: Technically successful bland embolization of the dominant hypervascular mass within the peripheral aspect of the left lobe of the liver.  PLAN: The patient will return to interventional radiology clinic in approximately  1 month. A CMP will be obtained at that time and the patient will be evaluated to ensure complete recovery from this initial procedure prior to proceeding with bland embolization of the additional known lesion within the right lobe of the liver.   Electronically Signed   By: Sandi Mariscal M.D.   On: 03/23/2014 17:23   Ir US Guide Vasc Access Right  03/23/2014   INDICATION: History of hepatitis, polysubstance abuse, alcoholism and cirrhosis and known multifocal hepatic cellular carcinoma. Patient presents today for bland embolization of the dominant lesion within the left lobe of the liver.  EXAM: 1. ULTRASOUND GUIDANCE FOR ARTERIAL ACCESS. 2. CELIAC AND SUPERIOR MESENTERIC ARTERIOGRAMS (1st ORDER) 3. LEFT LATERAL SEGMENTAL HEPATIC ARTERIOGRAM (3rd ORDER) 4. FLUOROSCOPIC GUIDED BLAND EMBOLIZATION OF THE LEFT LATERAL SEGMENTAL HEPATIC ARTERY.  COMPARISON:  CTA of the chest, abdomen and pelvis - 12/22/2013  MEDICATIONS: Versed 1 mg IV; Fentanyl 75 mcg IV; Vancomycin 1 g IV  Sedation time: 45 minutes  CONTRAST:  75 cc Omnipaque 300  FLUOROSCOPY TIME:  10 minutes.  30 seconds.  COMPLICATIONS: None immediate  TECHNIQUE: Informed written consent was obtained from the patient after a discussion of the risks, benefits and alternatives to treatment. Questions regarding the procedure were encouraged and answered. A timeout was performed prior to the initiation of the procedure.  The right groin was prepped and draped in the usual sterile fashion, and a sterile drape was applied covering the operative field. Maximum barrier sterile technique with sterile gowns and gloves were used for the procedure. A timeout was performed prior to the initiation of the procedure. Local anesthesia was provided with 1% lidocaine.  The right femoral head was marked fluoroscopically. Under ultrasound guidance, the right common femoral artery was accessed with a micropuncture kit after the overlying soft tissues were anesthetized with 1% lidocaine. An ultrasound image was saved for documentation purposes. The micropuncture sheath was exchanged for a 5 [place] vascular sheath over a Bentson wire. A closure arteriogram was performed through the side of the sheath confirming access within the right common femoral artery.  Over a  Bentson wire, a Mickelson catheter was advanced to the level of the thoracic aorta where it was back bled and flushed. The catheter was then utilized to select the superior mesenteric arch artery and a superior mesenteric arteriogram with delayed portal venous phase imaging was acquired.  The Mickelson within advance cranially and utilized to select the celiac artery and a celiac arteriogram was performed.  With the use of a Fathom micro wire, a high-flow Renegade micro catheter was utilized to select the left lateral segmental hepatic artery and a left lateral segmental hepatic arteriogram was performed. Appropriate embolization positioning was confirmed within the peripheral aspect of the vessel and the dominant hypervascular mass within the peripheral aspect of the lateral segment of the left lobe of the liver was embolized with approximately 1 vial of 100-300 micron Embospheres.  The micro catheter was slightly withdrawn into the more proximal aspect of the left lateral hepatic artery and post embolization arteriogram was performed.  Images were reviewed and the procedure was terminated. All wires catheters and sheaths were removed from the patient. Hemostasis was achieved at the right groin access site with manual compression. A dressing was placed. The patient tolerated the procedure well without immediate postprocedural complication.  FINDINGS: Initial superior mesenteric arteriogram failed to delineate definitive replaced or accessory supply to the hepatic parenchyma. Delayed portal venous phase imaging confirmed patency of the main,  right and left portal veins.  Celiac arteriogram demonstrates separate origins of the left medial and lateral segmental hepatic arteries. Otherwise, conventional branch branching pattern of the celiac trunk is demonstrated  The left lateral segmental hepatic artery was selected with sub selective angiogram demonstrating perfusion into the dominant mass within the peripheral  aspect of the lateral segment of the left lobe of the liver as was demonstrated on preceding CTA. Appropriate embolization positioning was confirmed within the peripheral aspect of this vessel and bland embolization was performed with 100 to 300 Embospheres.  Post embolization left lateral segmental hepatic arteriogram demonstrates near complete stasis of the vessel with no residual perfusion of the known dominant hypervascular mass.  IMPRESSION: Technically successful bland embolization of the dominant hypervascular mass within the peripheral aspect of the left lobe of the liver.  PLAN: The patient will return to interventional radiology clinic in approximately 1 month. A CMP will be obtained at that time and the patient will be evaluated to ensure complete recovery from this initial procedure prior to proceeding with bland embolization of the additional known lesion within the right lobe of the liver.   Electronically Signed   By: Sandi Mariscal M.D.   On: 03/23/2014 17:23   Ir Embo Tumor Organ Ischemia Infarct Inc Guide Roadmapping  03/23/2014   INDICATION: History of hepatitis, polysubstance abuse, alcoholism and cirrhosis and known multifocal hepatic cellular carcinoma. Patient presents today for bland embolization of the dominant lesion within the left lobe of the liver.  EXAM: 1. ULTRASOUND GUIDANCE FOR ARTERIAL ACCESS. 2. CELIAC AND SUPERIOR MESENTERIC ARTERIOGRAMS (1st ORDER) 3. LEFT LATERAL SEGMENTAL HEPATIC ARTERIOGRAM (3rd ORDER) 4. FLUOROSCOPIC GUIDED BLAND EMBOLIZATION OF THE LEFT LATERAL SEGMENTAL HEPATIC ARTERY.  COMPARISON:  CTA of the chest, abdomen and pelvis - 12/22/2013  MEDICATIONS: Versed 1 mg IV; Fentanyl 75 mcg IV; Vancomycin 1 g IV  Sedation time: 45 minutes  CONTRAST:  75 cc Omnipaque 300  FLUOROSCOPY TIME:  10 minutes.  30 seconds.  COMPLICATIONS: None immediate  TECHNIQUE: Informed written consent was obtained from the patient after a discussion of the risks, benefits and alternatives to  treatment. Questions regarding the procedure were encouraged and answered. A timeout was performed prior to the initiation of the procedure.  The right groin was prepped and draped in the usual sterile fashion, and a sterile drape was applied covering the operative field. Maximum barrier sterile technique with sterile gowns and gloves were used for the procedure. A timeout was performed prior to the initiation of the procedure. Local anesthesia was provided with 1% lidocaine.  The right femoral head was marked fluoroscopically. Under ultrasound guidance, the right common femoral artery was accessed with a micropuncture kit after the overlying soft tissues were anesthetized with 1% lidocaine. An ultrasound image was saved for documentation purposes. The micropuncture sheath was exchanged for a 5 [place] vascular sheath over a Bentson wire. A closure arteriogram was performed through the side of the sheath confirming access within the right common femoral artery.  Over a Bentson wire, a Mickelson catheter was advanced to the level of the thoracic aorta where it was back bled and flushed. The catheter was then utilized to select the superior mesenteric arch artery and a superior mesenteric arteriogram with delayed portal venous phase imaging was acquired.  The Mickelson within advance cranially and utilized to select the celiac artery and a celiac arteriogram was performed.  With the use of a Fathom micro wire, a high-flow Renegade micro catheter was utilized to  select the left lateral segmental hepatic artery and a left lateral segmental hepatic arteriogram was performed. Appropriate embolization positioning was confirmed within the peripheral aspect of the vessel and the dominant hypervascular mass within the peripheral aspect of the lateral segment of the left lobe of the liver was embolized with approximately 1 vial of 100-300 micron Embospheres.  The micro catheter was slightly withdrawn into the more proximal  aspect of the left lateral hepatic artery and post embolization arteriogram was performed.  Images were reviewed and the procedure was terminated. All wires catheters and sheaths were removed from the patient. Hemostasis was achieved at the right groin access site with manual compression. A dressing was placed. The patient tolerated the procedure well without immediate postprocedural complication.  FINDINGS: Initial superior mesenteric arteriogram failed to delineate definitive replaced or accessory supply to the hepatic parenchyma. Delayed portal venous phase imaging confirmed patency of the main, right and left portal veins.  Celiac arteriogram demonstrates separate origins of the left medial and lateral segmental hepatic arteries. Otherwise, conventional branch branching pattern of the celiac trunk is demonstrated  The left lateral segmental hepatic artery was selected with sub selective angiogram demonstrating perfusion into the dominant mass within the peripheral aspect of the lateral segment of the left lobe of the liver as was demonstrated on preceding CTA. Appropriate embolization positioning was confirmed within the peripheral aspect of this vessel and bland embolization was performed with 100 to 300 Embospheres.  Post embolization left lateral segmental hepatic arteriogram demonstrates near complete stasis of the vessel with no residual perfusion of the known dominant hypervascular mass.  IMPRESSION: Technically successful bland embolization of the dominant hypervascular mass within the peripheral aspect of the left lobe of the liver.  PLAN: The patient will return to interventional radiology clinic in approximately 1 month. A CMP will be obtained at that time and the patient will be evaluated to ensure complete recovery from this initial procedure prior to proceeding with bland embolization of the additional known lesion within the right lobe of the liver.   Electronically Signed   By: Sandi Mariscal M.D.    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 Pascal Lux  LOS: 0 days    ALLRED,D [facility] 03/23/2014

## 2014-03-24 ENCOUNTER — Ambulatory Visit: Payer: Self-pay | Admitting: Hematology and Oncology

## 2014-03-24 ENCOUNTER — Other Ambulatory Visit: Payer: Self-pay | Admitting: Radiology

## 2014-03-24 ENCOUNTER — Other Ambulatory Visit: Payer: Self-pay

## 2014-03-24 ENCOUNTER — Encounter (HOSPITAL_COMMUNITY): Payer: Self-pay

## 2014-03-24 DIAGNOSIS — C220 Liver cell carcinoma: Secondary | ICD-10-CM

## 2014-03-24 DIAGNOSIS — C228 Malignant neoplasm of liver, primary, unspecified as to type: Secondary | ICD-10-CM | POA: Diagnosis not present

## 2014-03-24 LAB — AFP TUMOR MARKER: AFP TUMOR MARKER: 4709.6 ng/mL — AB (ref <6.1)

## 2014-03-24 NOTE — Progress Notes (Signed)
Patient d/c home with daughter. Patient d/c instructions given to daughter, verbalized understanding. Patient stable.Sandie Ano RN

## 2014-03-24 NOTE — Progress Notes (Signed)
Fentanyl PCA 30cc wasted in the sink,witnessed by Reyne Dumas RN.- Sandie Ano RN

## 2014-03-24 NOTE — Progress Notes (Signed)
Wasted 30cc of Fentanyl PCA, wasted in the sink witnessed by Reyne Dumas RN- Seibert Keeter A lviar

## 2014-03-24 NOTE — Discharge Summary (Signed)
Pt will f/u at interventional radiology clinic in 1 month.  A CMP will be obtained at that visit.

## 2014-03-24 NOTE — Discharge Instructions (Signed)

## 2014-03-24 NOTE — Discharge Summary (Signed)
Physician Discharge Summary  Burns ID: Dorothy Burns MRN: [id number] DOB/AGE: 22-Jun-1956 58 y.o.  Admit date: 03/23/2014 Discharge date: 03/24/2014  Admission Diagnoses: Multifocal hepatocellular carcinoma  Discharge Diagnoses: Multifocal hepatocellular carcinoma, status post technically successful bland arterial embolization of Dorothy dominant hypervascular mass within Dorothy peripheral aspect of Dorothy left lobe of Dorothy liver. Active Problems:   Hepatocellular carcinoma  Past Medical History  Diagnosis Date  . Hypertension   . Bipolar 1 disorder   . Menorrhagia   . Post-menopausal bleeding     MOST RECENT BLEEDING  WAS NOV 2013  . Paresthesia   . COPD (chronic obstructive pulmonary disease)   . 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 Gout   . Mental disorder     bipolar  . Asthma   . Shortness of breath   . Anemia   . GERD (gastroesophageal reflux disease)   . Gallstones     ABD PAIN, N&V  . Arthritis   . Headache(784.0)   . Hepatitis     Hep C x 10 yrs  - NO TREATMENT  . PONV (postoperative nausea and vomiting)   . Cirrhosis    Past Surgical History  Procedure Laterality Date  . Tubal ligation    . Facial reconstruction surgery    . Multiple tooth extractions    . Hysteroscopy w/d&c  09/29/2011    Procedure: DILATATION AND CURETTAGE /HYSTEROSCOPY;  Surgeon: Mora Bellman, MD;  Location: [facility];  Service: Gynecology;  Laterality: N/A;  . Cholecystectomy  08/20/2012    Procedure: LAPAROSCOPIC CHOLECYSTECTOMY;  Surgeon: Ralene Ok, MD;  Location: [facility];  Service: General;;  . Embolization of dominant hcc within left lobe of liver  03/23/14      Discharged Condition: good  Hospital Course: Dorothy Burns is a 58 year old white female who was referred to Dorothy interventional radiology service by Dr. Alvy Bimler  for percutaneous treatment options for multifocal hepatocellular carcinoma . Dorothy Burns's past medical history is also significant for hepatitis C,  polysubstance abuse, alcoholism and cirrhosis . She is not a surgical candidate. Prior imaging revealed enhancing lesions in Dorothy anterior segment of Dorothy right hepatic lobe and a larger lesion in Dorothy lateral segment of Dorothy left hepatic lobe both compatible with HCC. Current AFP is 4709.6 ng/ml. Following consultation with Dr. Pascal Lux decision was made to proceed with bland hepatic arterial embolization initially of Dorothy left lobe lesion and ultimately on Dorothy right. On 03/23/2014 Dorothy Burns underwent technically successful bland arterial embolization of Dorothy dominant hypervascular mass/HCC within Dorothy peripheral aspect of Dorothy left lobe of Dorothy liver. Dorothy procedure was performed via IV conscious sedation and without immediate complications. She was subsequently admitted to [facility] hospital for overnight observation for hemodynamic monitoring and pain control. She was placed on fentanyl PCA pump. Overnight Dorothy Burns did fairly well with exception of some mild pelvic discomfort. On Dorothy morning of discharge Dorothy Burns was stable. She was able to void , ambulate and tolerate her diet without difficulty. She did have several bowel movements. She denied any new acute complaints . She was seen by Dr. Anselm Pancoast (IR) and deemed stable for discharge at this time . She will followup with Dr. Pascal Lux in [facility] IR clinic in approximately one month. She'll continue current oncologic care with Dr. Alvy Bimler.  She will remain on her current home medications and contact our service with any additional questions or concerns.  Consults: none  Significant Diagnostic Studies:  Results for orders  placed during Dorothy hospital encounter of 03/23/14  URINE RAPID DRUG SCREEN (HOSP PERFORMED)      Result Value Ref Range   Opiates NONE DETECTED  NONE DETECTED   Cocaine NONE DETECTED  NONE DETECTED   Benzodiazepines NONE DETECTED  NONE DETECTED   Amphetamines NONE DETECTED  NONE DETECTED   Tetrahydrocannabinol NONE DETECTED  NONE DETECTED   Barbiturates  NONE DETECTED  NONE DETECTED  APTT      Result Value Ref Range   aPTT 33  24 - 37 seconds  CBC WITH DIFFERENTIAL      Result Value Ref Range   WBC 2.7 (*) 4.0 - 10.5 K/uL   RBC 4.12  3.87 - 5.11 MIL/uL   Hemoglobin 12.9  12.0 - 15.0 g/dL   HCT 37.7  36.0 - 46.0 %   MCV 91.5  78.0 - 100.0 fL   MCH 31.3  26.0 - 34.0 pg   MCHC 34.2  30.0 - 36.0 g/dL   RDW 15.1  11.5 - 15.5 %   Platelets 72 (*) 150 - 400 K/uL   Neutrophils Relative % 41 (*) 43 - 77 %   Neutro Abs 1.1 (*) 1.7 - 7.7 K/uL   Lymphocytes Relative 38  12 - 46 %   Lymphs Abs 1.0  0.7 - 4.0 K/uL   Monocytes Relative 18 (*) 3 - 12 %   Monocytes Absolute 0.5  0.1 - 1.0 K/uL   Eosinophils Relative 3  0 - 5 %   Eosinophils Absolute 0.1  0.0 - 0.7 K/uL   Basophils Relative 0  0 - 1 %   Basophils Absolute 0.0  0.0 - 0.1 K/uL  COMPREHENSIVE METABOLIC PANEL      Result Value Ref Range   Sodium 139  137 - 147 mEq/L   Potassium 3.5 (*) 3.7 - 5.3 mEq/L   Chloride 96  96 - 112 mEq/L   CO2 30  19 - 32 mEq/L   Glucose, Bld 83  70 - 99 mg/dL   BUN 13  6 - 23 mg/dL   Creatinine, Ser 1.15 (*) 0.50 - 1.10 mg/dL   Calcium 9.1  8.4 - 10.5 mg/dL   Total Protein 7.5  6.0 - 8.3 g/dL   Albumin 3.0 (*) 3.5 - 5.2 g/dL   AST 124 (*) 0 - 37 U/L   ALT 30  0 - 35 U/L   Alkaline Phosphatase 152 (*) 39 - 117 U/L   Total Bilirubin 0.8  0.3 - 1.2 mg/dL   GFR calc non Af Amer 51 (*) >90 mL/min   GFR calc Af Amer 60 (*) >90 mL/min   Anion gap 13  5 - 15  PROTIME-INR      Result Value Ref Range   Prothrombin Time 15.6 (*) 11.6 - 15.2 seconds   INR 1.24  0.00 - 1.49  AFP TUMOR MARKER      Result Value Ref Range   AFP-Tumor Marker 4709.6 (*) <6.1 ng/mL     Treatments: Ir Angiogram Visceral Selective  03/23/2014   INDICATION: History of hepatitis, polysubstance abuse, alcoholism and cirrhosis and known multifocal hepatic cellular carcinoma. Burns presents today for bland embolization of Dorothy dominant lesion within Dorothy left lobe of Dorothy liver.   EXAM: 1. ULTRASOUND GUIDANCE FOR ARTERIAL ACCESS. 2. CELIAC AND SUPERIOR MESENTERIC ARTERIOGRAMS (1st ORDER) 3. LEFT LATERAL SEGMENTAL HEPATIC ARTERIOGRAM (3rd ORDER) 4. FLUOROSCOPIC GUIDED BLAND EMBOLIZATION OF Dorothy LEFT LATERAL SEGMENTAL HEPATIC ARTERY.  COMPARISON:  CTA of Dorothy chest,  abdomen and pelvis - 12/22/2013  MEDICATIONS: Versed 1 mg IV; Fentanyl 75 mcg IV; Vancomycin 1 g IV  Sedation time: 45 minutes  CONTRAST:  75 cc Omnipaque 300  FLUOROSCOPY TIME:  10 minutes.  30 seconds.  COMPLICATIONS: None immediate  TECHNIQUE: Informed written consent was obtained from Dorothy Burns after a discussion of Dorothy risks, benefits and alternatives to treatment. Questions regarding Dorothy procedure were encouraged and answered. A timeout was performed prior to Dorothy initiation of Dorothy procedure.  Dorothy right groin was prepped and draped in Dorothy usual sterile fashion, and a sterile drape was applied covering Dorothy operative field. Maximum barrier sterile technique with sterile gowns and gloves were used for Dorothy procedure. A timeout was performed prior to Dorothy initiation of Dorothy procedure. Local anesthesia was provided with 1% lidocaine.  Dorothy right femoral head was marked fluoroscopically. Under ultrasound guidance, Dorothy right common femoral artery was accessed with a micropuncture kit after Dorothy overlying soft tissues were anesthetized with 1% lidocaine. An ultrasound image was saved for documentation purposes. Dorothy micropuncture sheath was exchanged for a 5 [place] vascular sheath over a Bentson wire. A closure arteriogram was performed through Dorothy side of Dorothy sheath confirming access within Dorothy right common femoral artery.  Over a Bentson wire, a Mickelson catheter was advanced to Dorothy level of Dorothy thoracic aorta where it was back bled and flushed. Dorothy catheter was then utilized to select Dorothy superior mesenteric arch artery and a superior mesenteric arteriogram with delayed portal venous phase imaging was acquired.  Dorothy Mickelson within  advance cranially and utilized to select Dorothy celiac artery and a celiac arteriogram was performed.  With Dorothy use of a Fathom micro wire, a high-flow Renegade micro catheter was utilized to select Dorothy left lateral segmental hepatic artery and a left lateral segmental hepatic arteriogram was performed. Appropriate embolization positioning was confirmed within Dorothy peripheral aspect of Dorothy vessel and Dorothy dominant hypervascular mass within Dorothy peripheral aspect of Dorothy lateral segment of Dorothy left lobe of Dorothy liver was embolized with approximately 1 vial of 100-300 micron Embospheres.  Dorothy micro catheter was slightly withdrawn into Dorothy more proximal aspect of Dorothy left lateral hepatic artery and post embolization arteriogram was performed.  Images were reviewed and Dorothy procedure was terminated. All wires catheters and sheaths were removed from Dorothy Burns. Hemostasis was achieved at Dorothy right groin access site with manual compression. A dressing was placed. Dorothy Burns tolerated Dorothy procedure well without immediate postprocedural complication.  FINDINGS: Initial superior mesenteric arteriogram failed to delineate definitive replaced or accessory supply to Dorothy hepatic parenchyma. Delayed portal venous phase imaging confirmed patency of Dorothy main, right and left portal veins.  Celiac arteriogram demonstrates separate origins of Dorothy left medial and lateral segmental hepatic arteries. Otherwise, conventional branch branching pattern of Dorothy celiac trunk is demonstrated  Dorothy left lateral segmental hepatic artery was selected with sub selective angiogram demonstrating perfusion into Dorothy dominant mass within Dorothy peripheral aspect of Dorothy lateral segment of Dorothy left lobe of Dorothy liver as was demonstrated on preceding CTA. Appropriate embolization positioning was confirmed within Dorothy peripheral aspect of this vessel and bland embolization was performed with 100 to 300 Embospheres.  Post embolization left lateral segmental hepatic  arteriogram demonstrates near complete stasis of Dorothy vessel with no residual perfusion of Dorothy known dominant hypervascular mass.  IMPRESSION: Technically successful bland embolization of Dorothy dominant hypervascular mass within Dorothy peripheral aspect of Dorothy left lobe of Dorothy liver.  PLAN: Dorothy Burns will  return to interventional radiology clinic in approximately 1 month. A CMP will be obtained at that time and Dorothy Burns will be evaluated to ensure complete recovery from this initial procedure prior to proceeding with bland embolization of Dorothy additional known lesion within Dorothy right lobe of Dorothy liver.   Electronically Signed   By: Sandi Mariscal M.D.   On: 03/23/2014 17:23   Ir Angiogram Visceral Selective  03/23/2014   INDICATION: History of hepatitis, polysubstance abuse, alcoholism and cirrhosis and known multifocal hepatic cellular carcinoma. Burns presents today for bland embolization of Dorothy dominant lesion within Dorothy left lobe of Dorothy liver.  EXAM: 1. ULTRASOUND GUIDANCE FOR ARTERIAL ACCESS. 2. CELIAC AND SUPERIOR MESENTERIC ARTERIOGRAMS (1st ORDER) 3. LEFT LATERAL SEGMENTAL HEPATIC ARTERIOGRAM (3rd ORDER) 4. FLUOROSCOPIC GUIDED BLAND EMBOLIZATION OF Dorothy LEFT LATERAL SEGMENTAL HEPATIC ARTERY.  COMPARISON:  CTA of Dorothy chest, abdomen and pelvis - 12/22/2013  MEDICATIONS: Versed 1 mg IV; Fentanyl 75 mcg IV; Vancomycin 1 g IV  Sedation time: 45 minutes  CONTRAST:  75 cc Omnipaque 300  FLUOROSCOPY TIME:  10 minutes.  30 seconds.  COMPLICATIONS: None immediate  TECHNIQUE: Informed written consent was obtained from Dorothy Burns after a discussion of Dorothy risks, benefits and alternatives to treatment. Questions regarding Dorothy procedure were encouraged and answered. A timeout was performed prior to Dorothy initiation of Dorothy procedure.  Dorothy right groin was prepped and draped in Dorothy usual sterile fashion, and a sterile drape was applied covering Dorothy operative field. Maximum barrier sterile technique with sterile gowns and gloves  were used for Dorothy procedure. A timeout was performed prior to Dorothy initiation of Dorothy procedure. Local anesthesia was provided with 1% lidocaine.  Dorothy right femoral head was marked fluoroscopically. Under ultrasound guidance, Dorothy right common femoral artery was accessed with a micropuncture kit after Dorothy overlying soft tissues were anesthetized with 1% lidocaine. An ultrasound image was saved for documentation purposes. Dorothy micropuncture sheath was exchanged for a 5 [place] vascular sheath over a Bentson wire. A closure arteriogram was performed through Dorothy side of Dorothy sheath confirming access within Dorothy right common femoral artery.  Over a Bentson wire, a Mickelson catheter was advanced to Dorothy level of Dorothy thoracic aorta where it was back bled and flushed. Dorothy catheter was then utilized to select Dorothy superior mesenteric arch artery and a superior mesenteric arteriogram with delayed portal venous phase imaging was acquired.  Dorothy Mickelson within advance cranially and utilized to select Dorothy celiac artery and a celiac arteriogram was performed.  With Dorothy use of a Fathom micro wire, a high-flow Renegade micro catheter was utilized to select Dorothy left lateral segmental hepatic artery and a left lateral segmental hepatic arteriogram was performed. Appropriate embolization positioning was confirmed within Dorothy peripheral aspect of Dorothy vessel and Dorothy dominant hypervascular mass within Dorothy peripheral aspect of Dorothy lateral segment of Dorothy left lobe of Dorothy liver was embolized with approximately 1 vial of 100-300 micron Embospheres.  Dorothy micro catheter was slightly withdrawn into Dorothy more proximal aspect of Dorothy left lateral hepatic artery and post embolization arteriogram was performed.  Images were reviewed and Dorothy procedure was terminated. All wires catheters and sheaths were removed from Dorothy Burns. Hemostasis was achieved at Dorothy right groin access site with manual compression. A dressing was placed. Dorothy Burns tolerated Dorothy  procedure well without immediate postprocedural complication.  FINDINGS: Initial superior mesenteric arteriogram failed to delineate definitive replaced or accessory supply to Dorothy hepatic parenchyma. Delayed portal venous phase imaging  confirmed patency of Dorothy main, right and left portal veins.  Celiac arteriogram demonstrates separate origins of Dorothy left medial and lateral segmental hepatic arteries. Otherwise, conventional branch branching pattern of Dorothy celiac trunk is demonstrated  Dorothy left lateral segmental hepatic artery was selected with sub selective angiogram demonstrating perfusion into Dorothy dominant mass within Dorothy peripheral aspect of Dorothy lateral segment of Dorothy left lobe of Dorothy liver as was demonstrated on preceding CTA. Appropriate embolization positioning was confirmed within Dorothy peripheral aspect of this vessel and bland embolization was performed with 100 to 300 Embospheres.  Post embolization left lateral segmental hepatic arteriogram demonstrates near complete stasis of Dorothy vessel with no residual perfusion of Dorothy known dominant hypervascular mass.  IMPRESSION: Technically successful bland embolization of Dorothy dominant hypervascular mass within Dorothy peripheral aspect of Dorothy left lobe of Dorothy liver.  PLAN: Dorothy Burns will return to interventional radiology clinic in approximately 1 month. A CMP will be obtained at that time and Dorothy Burns will be evaluated to ensure complete recovery from this initial procedure prior to proceeding with bland embolization of Dorothy additional known lesion within Dorothy right lobe of Dorothy liver.   Electronically Signed   By: Sandi Mariscal M.D.   On: 03/23/2014 17:23   Ir Angiogram Selective Each Additional Vessel  03/23/2014   INDICATION: History of hepatitis, polysubstance abuse, alcoholism and cirrhosis and known multifocal hepatic cellular carcinoma. Burns presents today for bland embolization of Dorothy dominant lesion within Dorothy left lobe of Dorothy liver.  EXAM: 1. ULTRASOUND  GUIDANCE FOR ARTERIAL ACCESS. 2. CELIAC AND SUPERIOR MESENTERIC ARTERIOGRAMS (1st ORDER) 3. LEFT LATERAL SEGMENTAL HEPATIC ARTERIOGRAM (3rd ORDER) 4. FLUOROSCOPIC GUIDED BLAND EMBOLIZATION OF Dorothy LEFT LATERAL SEGMENTAL HEPATIC ARTERY.  COMPARISON:  CTA of Dorothy chest, abdomen and pelvis - 12/22/2013  MEDICATIONS: Versed 1 mg IV; Fentanyl 75 mcg IV; Vancomycin 1 g IV  Sedation time: 45 minutes  CONTRAST:  75 cc Omnipaque 300  FLUOROSCOPY TIME:  10 minutes.  30 seconds.  COMPLICATIONS: None immediate  TECHNIQUE: Informed written consent was obtained from Dorothy Burns after a discussion of Dorothy risks, benefits and alternatives to treatment. Questions regarding Dorothy procedure were encouraged and answered. A timeout was performed prior to Dorothy initiation of Dorothy procedure.  Dorothy right groin was prepped and draped in Dorothy usual sterile fashion, and a sterile drape was applied covering Dorothy operative field. Maximum barrier sterile technique with sterile gowns and gloves were used for Dorothy procedure. A timeout was performed prior to Dorothy initiation of Dorothy procedure. Local anesthesia was provided with 1% lidocaine.  Dorothy right femoral head was marked fluoroscopically. Under ultrasound guidance, Dorothy right common femoral artery was accessed with a micropuncture kit after Dorothy overlying soft tissues were anesthetized with 1% lidocaine. An ultrasound image was saved for documentation purposes. Dorothy micropuncture sheath was exchanged for a 5 [place] vascular sheath over a Bentson wire. A closure arteriogram was performed through Dorothy side of Dorothy sheath confirming access within Dorothy right common femoral artery.  Over a Bentson wire, a Mickelson catheter was advanced to Dorothy level of Dorothy thoracic aorta where it was back bled and flushed. Dorothy catheter was then utilized to select Dorothy superior mesenteric arch artery and a superior mesenteric arteriogram with delayed portal venous phase imaging was acquired.  Dorothy Mickelson within advance cranially and  utilized to select Dorothy celiac artery and a celiac arteriogram was performed.  With Dorothy use of a Fathom micro wire, a high-flow Renegade micro catheter was  utilized to select Dorothy left lateral segmental hepatic artery and a left lateral segmental hepatic arteriogram was performed. Appropriate embolization positioning was confirmed within Dorothy peripheral aspect of Dorothy vessel and Dorothy dominant hypervascular mass within Dorothy peripheral aspect of Dorothy lateral segment of Dorothy left lobe of Dorothy liver was embolized with approximately 1 vial of 100-300 micron Embospheres.  Dorothy micro catheter was slightly withdrawn into Dorothy more proximal aspect of Dorothy left lateral hepatic artery and post embolization arteriogram was performed.  Images were reviewed and Dorothy procedure was terminated. All wires catheters and sheaths were removed from Dorothy Burns. Hemostasis was achieved at Dorothy right groin access site with manual compression. A dressing was placed. Dorothy Burns tolerated Dorothy procedure well without immediate postprocedural complication.  FINDINGS: Initial superior mesenteric arteriogram failed to delineate definitive replaced or accessory supply to Dorothy hepatic parenchyma. Delayed portal venous phase imaging confirmed patency of Dorothy main, right and left portal veins.  Celiac arteriogram demonstrates separate origins of Dorothy left medial and lateral segmental hepatic arteries. Otherwise, conventional branch branching pattern of Dorothy celiac trunk is demonstrated  Dorothy left lateral segmental hepatic artery was selected with sub selective angiogram demonstrating perfusion into Dorothy dominant mass within Dorothy peripheral aspect of Dorothy lateral segment of Dorothy left lobe of Dorothy liver as was demonstrated on preceding CTA. Appropriate embolization positioning was confirmed within Dorothy peripheral aspect of this vessel and bland embolization was performed with 100 to 300 Embospheres.  Post embolization left lateral segmental hepatic arteriogram demonstrates  near complete stasis of Dorothy vessel with no residual perfusion of Dorothy known dominant hypervascular mass.  IMPRESSION: Technically successful bland embolization of Dorothy dominant hypervascular mass within Dorothy peripheral aspect of Dorothy left lobe of Dorothy liver.  PLAN: Dorothy Burns will return to interventional radiology clinic in approximately 1 month. A CMP will be obtained at that time and Dorothy Burns will be evaluated to ensure complete recovery from this initial procedure prior to proceeding with bland embolization of Dorothy additional known lesion within Dorothy right lobe of Dorothy liver.   Electronically Signed   By: Sandi Mariscal M.D.   On: 03/23/2014 17:23   Ir US Guide Vasc Access Right  03/23/2014   INDICATION: History of hepatitis, polysubstance abuse, alcoholism and cirrhosis and known multifocal hepatic cellular carcinoma. Burns presents today for bland embolization of Dorothy dominant lesion within Dorothy left lobe of Dorothy liver.  EXAM: 1. ULTRASOUND GUIDANCE FOR ARTERIAL ACCESS. 2. CELIAC AND SUPERIOR MESENTERIC ARTERIOGRAMS (1st ORDER) 3. LEFT LATERAL SEGMENTAL HEPATIC ARTERIOGRAM (3rd ORDER) 4. FLUOROSCOPIC GUIDED BLAND EMBOLIZATION OF Dorothy LEFT LATERAL SEGMENTAL HEPATIC ARTERY.  COMPARISON:  CTA of Dorothy chest, abdomen and pelvis - 12/22/2013  MEDICATIONS: Versed 1 mg IV; Fentanyl 75 mcg IV; Vancomycin 1 g IV  Sedation time: 45 minutes  CONTRAST:  75 cc Omnipaque 300  FLUOROSCOPY TIME:  10 minutes.  30 seconds.  COMPLICATIONS: None immediate  TECHNIQUE: Informed written consent was obtained from Dorothy Burns after a discussion of Dorothy risks, benefits and alternatives to treatment. Questions regarding Dorothy procedure were encouraged and answered. A timeout was performed prior to Dorothy initiation of Dorothy procedure.  Dorothy right groin was prepped and draped in Dorothy usual sterile fashion, and a sterile drape was applied covering Dorothy operative field. Maximum barrier sterile technique with sterile gowns and gloves were used for Dorothy  procedure. A timeout was performed prior to Dorothy initiation of Dorothy procedure. Local anesthesia was provided with 1% lidocaine.  Dorothy right femoral head  was marked fluoroscopically. Under ultrasound guidance, Dorothy right common femoral artery was accessed with a micropuncture kit after Dorothy overlying soft tissues were anesthetized with 1% lidocaine. An ultrasound image was saved for documentation purposes. Dorothy micropuncture sheath was exchanged for a 5 [place] vascular sheath over a Bentson wire. A closure arteriogram was performed through Dorothy side of Dorothy sheath confirming access within Dorothy right common femoral artery.  Over a Bentson wire, a Mickelson catheter was advanced to Dorothy level of Dorothy thoracic aorta where it was back bled and flushed. Dorothy catheter was then utilized to select Dorothy superior mesenteric arch artery and a superior mesenteric arteriogram with delayed portal venous phase imaging was acquired.  Dorothy Mickelson within advance cranially and utilized to select Dorothy celiac artery and a celiac arteriogram was performed.  With Dorothy use of a Fathom micro wire, a high-flow Renegade micro catheter was utilized to select Dorothy left lateral segmental hepatic artery and a left lateral segmental hepatic arteriogram was performed. Appropriate embolization positioning was confirmed within Dorothy peripheral aspect of Dorothy vessel and Dorothy dominant hypervascular mass within Dorothy peripheral aspect of Dorothy lateral segment of Dorothy left lobe of Dorothy liver was embolized with approximately 1 vial of 100-300 micron Embospheres.  Dorothy micro catheter was slightly withdrawn into Dorothy more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

## 2014-03-24 NOTE — Progress Notes (Signed)
UR Completed.  Dorothy Burns Jane [phone] 03/24/2014  

## 2014-03-28 ENCOUNTER — Other Ambulatory Visit: Payer: Self-pay | Admitting: Emergency Medicine

## 2014-03-28 DIAGNOSIS — C228 Malignant neoplasm of liver, primary, unspecified as to type: Secondary | ICD-10-CM

## 2014-04-10 ENCOUNTER — Emergency Department (HOSPITAL_COMMUNITY): Payer: Medicare Other

## 2014-04-10 ENCOUNTER — Inpatient Hospital Stay (HOSPITAL_COMMUNITY)
Admission: EM | Admit: 2014-04-10 | Discharge: 2014-04-17 | DRG: 435 | Disposition: A | Discharge status: Skilled Nursing Facility | Payer: Medicare Other | Attending: Internal Medicine | Admitting: Internal Medicine

## 2014-04-10 ENCOUNTER — Encounter (HOSPITAL_COMMUNITY): Payer: Self-pay | Admitting: Emergency Medicine

## 2014-04-10 DIAGNOSIS — F319 Bipolar disorder, unspecified: Secondary | ICD-10-CM | POA: Diagnosis present

## 2014-04-10 DIAGNOSIS — R1011 Right upper quadrant pain: Secondary | ICD-10-CM | POA: Diagnosis not present

## 2014-04-10 DIAGNOSIS — D638 Anemia in other chronic diseases classified elsewhere: Secondary | ICD-10-CM | POA: Diagnosis present

## 2014-04-10 DIAGNOSIS — Z79899 Other long term (current) drug therapy: Secondary | ICD-10-CM

## 2014-04-10 DIAGNOSIS — Z23 Encounter for immunization: Secondary | ICD-10-CM

## 2014-04-10 DIAGNOSIS — C228 Malignant neoplasm of liver, primary, unspecified as to type: Principal | ICD-10-CM | POA: Diagnosis present

## 2014-04-10 DIAGNOSIS — F17200 Nicotine dependence, unspecified, uncomplicated: Secondary | ICD-10-CM | POA: Diagnosis present

## 2014-04-10 DIAGNOSIS — K766 Portal hypertension: Secondary | ICD-10-CM | POA: Diagnosis present

## 2014-04-10 DIAGNOSIS — K7460 Unspecified cirrhosis of liver: Secondary | ICD-10-CM | POA: Diagnosis present

## 2014-04-10 DIAGNOSIS — K219 Gastro-esophageal reflux disease without esophagitis: Secondary | ICD-10-CM | POA: Diagnosis present

## 2014-04-10 DIAGNOSIS — B1920 Unspecified viral hepatitis C without hepatic coma: Secondary | ICD-10-CM | POA: Diagnosis present

## 2014-04-10 DIAGNOSIS — D6959 Other secondary thrombocytopenia: Secondary | ICD-10-CM | POA: Diagnosis present

## 2014-04-10 DIAGNOSIS — K7469 Other cirrhosis of liver: Secondary | ICD-10-CM

## 2014-04-10 DIAGNOSIS — R109 Unspecified abdominal pain: Secondary | ICD-10-CM | POA: Diagnosis present

## 2014-04-10 DIAGNOSIS — C220 Liver cell carcinoma: Secondary | ICD-10-CM | POA: Diagnosis present

## 2014-04-10 DIAGNOSIS — I10 Essential (primary) hypertension: Secondary | ICD-10-CM | POA: Diagnosis present

## 2014-04-10 DIAGNOSIS — IMO0002 Reserved for concepts with insufficient information to code with codable children: Secondary | ICD-10-CM

## 2014-04-10 DIAGNOSIS — R160 Hepatomegaly, not elsewhere classified: Secondary | ICD-10-CM

## 2014-04-10 DIAGNOSIS — J449 Chronic obstructive pulmonary disease, unspecified: Secondary | ICD-10-CM | POA: Diagnosis present

## 2014-04-10 DIAGNOSIS — J4489 Other specified chronic obstructive pulmonary disease: Secondary | ICD-10-CM | POA: Diagnosis present

## 2014-04-10 DIAGNOSIS — E43 Unspecified severe protein-calorie malnutrition: Secondary | ICD-10-CM | POA: Diagnosis present

## 2014-04-10 DIAGNOSIS — M109 Gout, unspecified: Secondary | ICD-10-CM | POA: Diagnosis present

## 2014-04-10 DIAGNOSIS — F1910 Other psychoactive substance abuse, uncomplicated: Secondary | ICD-10-CM

## 2014-04-10 DIAGNOSIS — G8929 Other chronic pain: Secondary | ICD-10-CM | POA: Diagnosis present

## 2014-04-10 LAB — CBC WITH DIFFERENTIAL/PLATELET
BASOS PCT: 0 % (ref 0–1)
Basophils Absolute: 0 10*3/uL (ref 0.0–0.1)
Eosinophils Absolute: 0 10*3/uL (ref 0.0–0.7)
Eosinophils Relative: 1 % (ref 0–5)
HEMATOCRIT: 35.1 % — AB (ref 36.0–46.0)
Hemoglobin: 12 g/dL (ref 12.0–15.0)
Lymphocytes Relative: 29 % (ref 12–46)
Lymphs Abs: 0.9 10*3/uL (ref 0.7–4.0)
MCH: 32.3 pg (ref 26.0–34.0)
MCHC: 34.2 g/dL (ref 30.0–36.0)
MCV: 94.6 fL (ref 78.0–100.0)
MONO ABS: 0.5 10*3/uL (ref 0.1–1.0)
Monocytes Relative: 16 % — ABNORMAL HIGH (ref 3–12)
NEUTROS ABS: 1.6 10*3/uL — AB (ref 1.7–7.7)
Neutrophils Relative %: 54 % (ref 43–77)
PLATELETS: 104 10*3/uL — AB (ref 150–400)
RBC: 3.71 MIL/uL — ABNORMAL LOW (ref 3.87–5.11)
RDW: 16.5 % — AB (ref 11.5–15.5)
WBC: 3 10*3/uL — ABNORMAL LOW (ref 4.0–10.5)

## 2014-04-10 LAB — COMPREHENSIVE METABOLIC PANEL
ALT: 26 U/L (ref 0–35)
AST: 81 U/L — AB (ref 0–37)
Albumin: 2.8 g/dL — ABNORMAL LOW (ref 3.5–5.2)
Alkaline Phosphatase: 185 U/L — ABNORMAL HIGH (ref 39–117)
Anion gap: 11 (ref 5–15)
BUN: 13 mg/dL (ref 6–23)
CALCIUM: 9.1 mg/dL (ref 8.4–10.5)
CO2: 29 mEq/L (ref 19–32)
CREATININE: 1.05 mg/dL (ref 0.50–1.10)
Chloride: 101 mEq/L (ref 96–112)
GFR calc Af Amer: 67 mL/min — ABNORMAL LOW (ref >90)
GFR calc non Af Amer: 57 mL/min — ABNORMAL LOW (ref >90)
Glucose, Bld: 86 mg/dL (ref 70–99)
Potassium: 3.1 mEq/L — ABNORMAL LOW (ref 3.7–5.3)
SODIUM: 141 meq/L (ref 137–147)
Total Bilirubin: 0.8 mg/dL (ref 0.3–1.2)
Total Protein: 7.5 g/dL (ref 6.0–8.3)

## 2014-04-10 LAB — I-STAT TROPONIN, ED: Troponin i, poc: 0.04 ng/mL (ref 0.00–0.08)

## 2014-04-10 LAB — LIPASE, BLOOD: LIPASE: 19 U/L (ref 11–59)

## 2014-04-10 MED ORDER — IOHEXOL 300 MG/ML  SOLN
100.0000 mL | Freq: Once | INTRAMUSCULAR | Status: AC | PRN
  Administered 2014-04-10: 100 mL via INTRAVENOUS

## 2014-04-10 MED ORDER — IOHEXOL 300 MG/ML  SOLN: 25.0000 mL | Freq: Once | INTRAMUSCULAR | Status: DC | PRN

## 2014-04-10 NOTE — ED Notes (Signed)
Attempted to call for room x 3 with no response.

## 2014-04-10 NOTE — ED Provider Notes (Signed)
CSN: [id number]     Arrival date & time 04/10/14  1634 History   First MD Initiated Contact with Patient 04/10/14 2158     Chief Complaint  Patient presents with  . Abdominal Pain    Patient is a 58 y.o. female presenting with abdominal pain. The history is provided by the patient.  Abdominal Pain Pain location:  RUQ Pain quality: aching   Pain radiates to:  Back Pain severity:  Mild Onset quality:  Gradual Duration: months but worse after IR procedure 2.5 weeks ago. Timing:  Constant Progression:  Unchanged Chronicity:  Chronic Context: alcohol use   Context: not eating, not sick contacts, not suspicious food intake and not trauma   Context comment:  Has HCC Associated symptoms: anorexia, diarrhea (12-14 times per day) and fatigue   Associated symptoms: no chest pain, no chills, no cough, no dysuria, no fever, no hematemesis, no hematochezia, no nausea, no shortness of breath and no vomiting   Risk factors: alcohol abuse and multiple surgeries (chole among others)      status post technically successful bland arterial embolization of the dominant hypervascular mass within the peripheral aspect of the left lobe of the liver. Early this week  Past Medical History  Diagnosis Date  . Hypertension   . Bipolar 1 disorder   . Menorrhagia   . Post-menopausal bleeding     MOST RECENT BLEEDING  WAS NOV 2013  . Paresthesia   . COPD (chronic obstructive pulmonary disease)   . 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 Gout   . Mental disorder     bipolar  . Asthma   . Shortness of breath   . Anemia   . GERD (gastroesophageal reflux disease)   . Gallstones     ABD PAIN, N&V  . Arthritis   . Headache(784.0)   . Hepatitis     Hep C x 10 yrs  - NO TREATMENT  . PONV (postoperative nausea and vomiting)   . Cirrhosis    Past Surgical History  Procedure Laterality Date  . Tubal ligation    . Facial reconstruction surgery    . Multiple tooth extractions    .  Hysteroscopy w/d&c  09/29/2011    Procedure: DILATATION AND CURETTAGE /HYSTEROSCOPY;  Surgeon: Mora Bellman, MD;  Location: [facility];  Service: Gynecology;  Laterality: N/A;  . Cholecystectomy  08/20/2012    Procedure: LAPAROSCOPIC CHOLECYSTECTOMY;  Surgeon: Ralene Ok, MD;  Location: [facility];  Service: General;;  . 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 COPD Mother   . Diabetes Mother   . Heart disease Mother   . Diabetes Father   . Heart disease Father   . Hypertension Father   . Cancer Father     basal cell ca and prostate ca   History  Substance Use Topics  . Smoking status: Current Every Day Smoker -- 0.25 packs/day for 30 years    Types: Cigarettes  . Smokeless tobacco: Never Used     Comment: in process of quitting---would like nicotine patch if insurance covers it  . Alcohol Use: No     Comment: recovering alcoholic    NO COCAINE SINCE 1990   OB History   Grav Para Term Preterm Abortions TAB SAB Ect Mult Living   8 4 4  0 4 1 3   4      Review  of Systems  Constitutional: Positive for fatigue and unexpected weight change (loss). Negative for fever and chills.  Respiratory: Negative for cough and shortness of breath.   Cardiovascular: Negative for chest pain.  Gastrointestinal: Positive for abdominal pain, diarrhea (12-14 times per day) and anorexia. Negative for nausea, vomiting, hematochezia and hematemesis.  Genitourinary: Negative for dysuria.  Musculoskeletal: Positive for back pain.  Psychiatric/Behavioral: Negative for confusion.  All other systems reviewed and are negative.   Allergies  Tylenol  Home Medications   Prior to Admission medications   Medication Sig Start Date End Date Taking? Authorizing Provider  budesonide-formoterol (SYMBICORT) 160-4.5 MCG/ACT inhaler Inhale 2 puffs into the lungs 2 (two) times daily.    Historical Provider, MD  clonazePAM (KLONOPIN) 2 MG tablet  Take 2 mg by mouth 2 (two) times daily.    Historical Provider, MD  colchicine 0.6 MG tablet Take 0.6 mg by mouth daily.    Historical Provider, MD  Cyanocobalamin (B-12 PO) Take 1 capsule by mouth daily.    Historical Provider, MD  divalproex (DEPAKOTE) 250 MG DR tablet Take 250 mg by mouth 2 (two) times daily.    Historical Provider, MD  furosemide (LASIX) 20 MG tablet Take 20 mg by mouth every morning.     Historical Provider, MD  gabapentin (NEURONTIN) 300 MG capsule Take 300 mg by mouth 3 (three) times daily. 10/17/13   Historical Provider, MD  hydrochlorothiazide (HYDRODIURIL) 25 MG tablet Take 25 mg by mouth every evening.    Historical Provider, MD  Multiple Vitamin (MULTIVITAMIN WITH MINERALS) TABS tablet Take 1 tablet by mouth daily.    Historical Provider, MD  omeprazole (PRILOSEC) 20 MG capsule Take 20 mg by mouth daily.    Historical Provider, MD  oxycodone (OXY-IR) 5 MG capsule Take 5 mg by mouth every 4 (four) hours as needed for pain.     Historical Provider, MD  pregabalin (LYRICA) 75 MG capsule Take 75 mg by mouth 2 (two) times daily.    Historical Provider, MD  Thiamine HCl (B-1 PO) Take 1 capsule by mouth daily.    Historical Provider, MD  traMADol (ULTRAM) 50 MG tablet Take 50 mg by mouth every 6 (six) hours as needed for moderate pain.    Historical Provider, MD  zolpidem (AMBIEN) 10 MG tablet Take 10 mg by mouth at bedtime.    Historical Provider, MD   BP 112/77  Pulse 62  Temp(Src) 97.7 F (36.5 C) (Oral)  Resp 16  Ht 5\' 3"  (1.6 m)  Wt 138 lb (62.596 kg)  BMI 24.45 kg/m2  SpO2 97%  LMP 05/21/2012 Physical Exam  Nursing note and vitals reviewed. Constitutional: She is oriented to person, place, and time. She appears well-developed and well-nourished. No distress.  HENT:  Head: Normocephalic and atraumatic.  Nose: Nose normal.  Eyes: Conjunctivae are normal. No scleral icterus.  Neck: Normal range of motion. Neck supple. No tracheal deviation present.   Cardiovascular: Normal rate, regular rhythm and normal heart sounds.   No murmur heard. Pulmonary/Chest: Effort normal and breath sounds normal. No respiratory distress. She has no rales.  Abdominal: Soft. Bowel sounds are normal. She exhibits no distension and no mass. There is no tenderness.  Neg CVAT and Murphys. NO TTP. No ascites  Musculoskeletal: Normal range of motion. She exhibits no edema.  Neurological: She is alert and oriented to person, place, and time.  Skin: Skin is warm and dry. No rash noted.  No jaundice  Psychiatric: She has a normal  mood and affect.    ED Course  Procedures (including critical care time) Labs Review Labs Reviewed  CBC WITH DIFFERENTIAL - Abnormal; Notable for the following:    WBC 3.0 (*)    RBC 3.71 (*)    HCT 35.1 (*)    RDW 16.5 (*)    Platelets 104 (*)    Neutro Abs 1.6 (*)    Monocytes Relative 16 (*)    All other components within normal limits  COMPREHENSIVE METABOLIC PANEL - Abnormal; Notable for the following:    Potassium 3.1 (*)    Albumin 2.8 (*)    AST 81 (*)    Alkaline Phosphatase 185 (*)    GFR calc non Af Amer 57 (*)    GFR calc Af Amer 67 (*)    All other components within normal limits  LIPASE, BLOOD  URINALYSIS, ROUTINE W REFLEX MICROSCOPIC  PROTIME-INR  I-STAT TROPOININ, ED    Imaging Review Ct Abdomen Pelvis W Contrast  04/11/2014   CLINICAL DATA:  Abdominal pain in multiple areas, including right upper quadrant.  EXAM: CT ABDOMEN AND PELVIS WITH CONTRAST  TECHNIQUE: Multidetector CT imaging of the abdomen and pelvis was performed using the standard protocol following bolus administration of intravenous contrast.  CONTRAST:  111mL OMNIPAQUE IOHEXOL 300 MG/ML  SOLN  COMPARISON:  12/22/2013  FINDINGS: BODY WALL: Unremarkable.  LOWER CHEST: Unremarkable.  ABDOMEN/PELVIS:  Liver: Liver cirrhosis with small lobulated liver, fissure enlargement, and recannulated periumbilical vein. Two enhancing masses consistent with  hepatocellular carcinoma. A 4.8 cm mass in segment eight appears larger than on December 23, 2013 abdominal CT, but stable in size compared to 12/15/2013 MRI. Differences on CT may be related to imaging phase. The recently treated left hepatic hepatic cellular carcinoma has a shrunken enhancing portion but new medial in inferiorly directed cavitary portion which measures 5.3 cm. The collection contains a few bubbles of anti dependent gas. There is a mildly enhancing capsule around this fluid component. The fluid collection has mass effect on the upper stomach, without definitive fistulization based on continuity of the gastric mucosa. Stable adenopathy in the deep liver drainage, occluding the porta hepatis and portacaval stations. Lower periesophageal varices.  Biliary: Cholecystectomy. No intrahepatic biliary ductal dilatation suggestive of biliary necrosis.  Pancreas: Unremarkable.  Spleen: Splenomegaly in the setting of portal hypertension.  Adrenals: Unremarkable.  Kidneys and ureters: No hydronephrosis or stone.  Bladder: Unremarkable.  Reproductive: Stable appearance of the uterus and ovaries.  Bowel: No obstruction.  Retroperitoneum: No mass or adenopathy.  Peritoneum: No ascites or pneumoperitoneum.  Vascular: No acute abnormality.  OSSEOUS: No acute abnormalities.  IMPRESSION: 1. Cirrhosis with 2 hepatocellular carcinomas. New 5 cm fluid and gas collection associated with recently treated left lobe mass which could represent abscess, sterile necrosis, or biloma. 2. Stable adenopathy in the deep liver drainage. 3. Portal hypertension with lower esophageal varices.   Electronically Signed   By: Jorje Guild M.D.   On: 04/11/2014 00:00     EKG Interpretation None      MDM   Final diagnoses:  RUQ pain  Mass of left lobe of liver    Pt with [place] s/p intraarterial embolization of HCC 3 weeks ago by IR.  Has had recurrence of pain in RUQ. No signs of peritonitis or systemic toxicity to suggest  surgical emergency.  Exam without explicit tenderness.  There is concern for necrosis which would be expected after embolization vs abscess. Afebrile, VSS. CT abd pelvis demonstrates new  fluid collection at treated left HCC mass with gas. DDx: lipoma, sterile necrosis or abscess.  No SIRS criteria on review of vitals.   12:20 AM spoke with radiologist on call who is concerned about presence of gas and recommends IR drainage to better characterize.  Order placed. Abx not recommended. Consulted hospitalist, will admit.  Made NPO.   Tammy Sours, MD 04/11/14 0040

## 2014-04-10 NOTE — ED Notes (Signed)
Per EMS: Pt reports RUQ pain and lower back pain x 1 week. Denies N/V/D. Reports weight loss. Hx of Liver cancer, Hep. C. Denies fever.chills. 126/86. 68 bpm. 16 RR. 98% RA.

## 2014-04-10 NOTE — ED Notes (Signed)
Patient transported to CT 

## 2014-04-10 NOTE — ED Notes (Signed)
Pt returned to ER. States "I went to [place] and now I am back."

## 2014-04-11 DIAGNOSIS — F319 Bipolar disorder, unspecified: Secondary | ICD-10-CM | POA: Diagnosis not present

## 2014-04-11 DIAGNOSIS — F1910 Other psychoactive substance abuse, uncomplicated: Secondary | ICD-10-CM

## 2014-04-11 DIAGNOSIS — J449 Chronic obstructive pulmonary disease, unspecified: Secondary | ICD-10-CM

## 2014-04-11 DIAGNOSIS — R1011 Right upper quadrant pain: Secondary | ICD-10-CM | POA: Diagnosis not present

## 2014-04-11 DIAGNOSIS — C228 Malignant neoplasm of liver, primary, unspecified as to type: Principal | ICD-10-CM

## 2014-04-11 DIAGNOSIS — J4489 Other specified chronic obstructive pulmonary disease: Secondary | ICD-10-CM

## 2014-04-11 DIAGNOSIS — I10 Essential (primary) hypertension: Secondary | ICD-10-CM

## 2014-04-11 LAB — BASIC METABOLIC PANEL
Anion gap: 7 (ref 5–15)
BUN: 13 mg/dL (ref 6–23)
CHLORIDE: 104 meq/L (ref 96–112)
CO2: 31 meq/L (ref 19–32)
Calcium: 9.1 mg/dL (ref 8.4–10.5)
Creatinine, Ser: 1.03 mg/dL (ref 0.50–1.10)
GFR calc Af Amer: 68 mL/min — ABNORMAL LOW (ref >90)
GFR calc non Af Amer: 59 mL/min — ABNORMAL LOW (ref >90)
Glucose, Bld: 67 mg/dL — ABNORMAL LOW (ref 70–99)
Potassium: 3.7 mEq/L (ref 3.7–5.3)
Sodium: 142 mEq/L (ref 137–147)

## 2014-04-11 LAB — CBC
HCT: 33.3 % — ABNORMAL LOW (ref 36.0–46.0)
HEMOGLOBIN: 11.2 g/dL — AB (ref 12.0–15.0)
MCH: 31.2 pg (ref 26.0–34.0)
MCHC: 33.6 g/dL (ref 30.0–36.0)
MCV: 92.8 fL (ref 78.0–100.0)
Platelets: 82 10*3/uL — ABNORMAL LOW (ref 150–400)
RBC: 3.59 MIL/uL — AB (ref 3.87–5.11)
RDW: 16.4 % — ABNORMAL HIGH (ref 11.5–15.5)
WBC: 2.6 10*3/uL — ABNORMAL LOW (ref 4.0–10.5)

## 2014-04-11 LAB — URINALYSIS, ROUTINE W REFLEX MICROSCOPIC
Bilirubin Urine: NEGATIVE
Glucose, UA: NEGATIVE mg/dL
Hgb urine dipstick: NEGATIVE
Ketones, ur: NEGATIVE mg/dL
LEUKOCYTES UA: NEGATIVE
NITRITE: NEGATIVE
PH: 7 (ref 5.0–8.0)
Protein, ur: NEGATIVE mg/dL
Specific Gravity, Urine: 1.019 (ref 1.005–1.030)
Urobilinogen, UA: 0.2 mg/dL (ref 0.0–1.0)

## 2014-04-11 LAB — PROTIME-INR
INR: 1.34 (ref 0.00–1.49)
INR: 1.35 (ref 0.00–1.49)
PROTHROMBIN TIME: 16.7 s — AB (ref 11.6–15.2)
Prothrombin Time: 16.6 seconds — ABNORMAL HIGH (ref 11.6–15.2)

## 2014-04-11 LAB — CLOSTRIDIUM DIFFICILE BY PCR: Toxigenic C. Difficile by PCR: NEGATIVE

## 2014-04-11 MED ORDER — ONDANSETRON HCL 4 MG/2ML IJ SOLN: 4.0000 mg | Freq: Four times a day (QID) | INTRAMUSCULAR | Status: DC | PRN

## 2014-04-11 MED ORDER — SODIUM CHLORIDE 0.9 % IJ SOLN
3.0000 mL | Freq: Two times a day (BID) | INTRAMUSCULAR | Status: DC
  Administered 2014-04-11 – 2014-04-17 (×13): 3 mL via INTRAVENOUS

## 2014-04-11 MED ORDER — FUROSEMIDE 20 MG PO TABS
20.0000 mg | ORAL_TABLET | Freq: Every day | ORAL | Status: DC
  Administered 2014-04-11 – 2014-04-17 (×7): 20 mg via ORAL
  Filled 2014-04-11 (×7): qty 1

## 2014-04-11 MED ORDER — HYDROMORPHONE HCL 1 MG/ML IJ SOLN: 1.0000 mg | INTRAMUSCULAR | Status: DC | PRN

## 2014-04-11 MED ORDER — CLONAZEPAM 1 MG PO TABS
1.0000 mg | ORAL_TABLET | Freq: Two times a day (BID) | ORAL | Status: DC | PRN
  Administered 2014-04-12 – 2014-04-17 (×4): 1 mg via ORAL
  Filled 2014-04-11 (×5): qty 1

## 2014-04-11 MED ORDER — PNEUMOCOCCAL VAC POLYVALENT 25 MCG/0.5ML IJ INJ
0.5000 mL | INJECTION | INTRAMUSCULAR | Status: AC
  Administered 2014-04-13: 0.5 mL via INTRAMUSCULAR
  Filled 2014-04-11: qty 0.5

## 2014-04-11 MED ORDER — ONDANSETRON HCL 4 MG PO TABS: 4.0000 mg | ORAL_TABLET | Freq: Four times a day (QID) | ORAL | Status: DC | PRN

## 2014-04-11 MED ORDER — COLCHICINE 0.6 MG PO TABS
0.6000 mg | ORAL_TABLET | Freq: Every day | ORAL | Status: DC
  Administered 2014-04-11 – 2014-04-17 (×7): 0.6 mg via ORAL
  Filled 2014-04-11 (×7): qty 1

## 2014-04-11 MED ORDER — SODIUM CHLORIDE 0.9 % IJ SOLN: 3.0000 mL | INTRAMUSCULAR | Status: DC | PRN

## 2014-04-11 MED ORDER — TRAMADOL HCL 50 MG PO TABS
50.0000 mg | ORAL_TABLET | Freq: Once | ORAL | Status: AC
  Administered 2014-04-11: 50 mg via ORAL
  Filled 2014-04-11: qty 1

## 2014-04-11 MED ORDER — PANTOPRAZOLE SODIUM 40 MG PO TBEC
40.0000 mg | DELAYED_RELEASE_TABLET | Freq: Every day | ORAL | Status: DC
  Administered 2014-04-11 – 2014-04-17 (×7): 40 mg via ORAL
  Filled 2014-04-11 (×6): qty 1

## 2014-04-11 MED ORDER — NICOTINE 7 MG/24HR TD PT24
7.0000 mg | MEDICATED_PATCH | TRANSDERMAL | Status: DC
  Administered 2014-04-11 – 2014-04-17 (×7): 7 mg via TRANSDERMAL
  Filled 2014-04-11 (×8): qty 1

## 2014-04-11 MED ORDER — GABAPENTIN 300 MG PO CAPS
300.0000 mg | ORAL_CAPSULE | Freq: Three times a day (TID) | ORAL | Status: DC
  Administered 2014-04-11 – 2014-04-17 (×18): 300 mg via ORAL
  Filled 2014-04-11 (×20): qty 1

## 2014-04-11 MED ORDER — SODIUM CHLORIDE 0.9 % IV SOLN: 250.0000 mL | INTRAVENOUS | Status: DC | PRN

## 2014-04-11 MED ORDER — DIVALPROEX SODIUM 250 MG PO DR TAB
250.0000 mg | DELAYED_RELEASE_TABLET | Freq: Two times a day (BID) | ORAL | Status: DC
  Administered 2014-04-11 – 2014-04-17 (×12): 250 mg via ORAL
  Filled 2014-04-11 (×14): qty 1

## 2014-04-11 MED ORDER — INFLUENZA VAC SPLIT QUAD 0.5 ML IM SUSY
0.5000 mL | PREFILLED_SYRINGE | INTRAMUSCULAR | Status: AC
  Administered 2014-04-13: 0.5 mL via INTRAMUSCULAR
  Filled 2014-04-11: qty 0.5

## 2014-04-11 MED ORDER — HYDROMORPHONE HCL 1 MG/ML IJ SOLN
1.0000 mg | INTRAMUSCULAR | Status: DC | PRN
  Administered 2014-04-11 – 2014-04-13 (×7): 1 mg via INTRAVENOUS
  Filled 2014-04-11 (×9): qty 1

## 2014-04-11 MED ORDER — ENSURE COMPLETE PO LIQD
237.0000 mL | Freq: Two times a day (BID) | ORAL | Status: DC
  Administered 2014-04-11 – 2014-04-13 (×5): 237 mL via ORAL

## 2014-04-11 MED ORDER — GABAPENTIN 600 MG PO TABS: 300.0000 mg | ORAL_TABLET | Freq: Three times a day (TID) | ORAL | Status: DC

## 2014-04-11 NOTE — Consult Note (Signed)
Referring Physician(s): Dr. Derrill Kay  Subjective: Dorothy Burns is 58 yo white female with hx of HCC, Hep C. She underwent recent bland embolization of the left lateral hepatic artery for treatment of large left HCC tumor. She did well with procedure but has had ongoing abd discomfort since then, this is expected after embolization. She also reports diarrhea, weakness, and wt loss. She was admitted last pm after CT abd done, suggested hepatic abscess v sterile necrosis v biloma at the embolization site. It was suggested that IR could possibly perform aspiration/drainage of this collection. Dorothy Burns currently feels ok, but not much better. Reports mild-mod pain in lateral aspect of RUQ, some nausea with diminished appetite. Denies recent fevers, chills.  Allergies: Tylenol  Medications: Prior to Admission medications   Medication Sig Start Date End Date Taking? Authorizing Provider  budesonide-formoterol (SYMBICORT) 160-4.5 MCG/ACT inhaler Inhale 2 puffs into the lungs 2 (two) times daily.   Yes Historical Provider, MD  clonazePAM (KLONOPIN) 2 MG tablet Take 2 mg by mouth 2 (two) times daily.   Yes Historical Provider, MD  colchicine 0.6 MG tablet Take 0.6 mg by mouth daily.   Yes Historical Provider, MD  Cyanocobalamin (B-12 PO) Take 1 capsule by mouth daily.   Yes Historical Provider, MD  divalproex (DEPAKOTE) 250 MG DR tablet Take 250 mg by mouth 2 (two) times daily.   Yes Historical Provider, MD  furosemide (LASIX) 20 MG tablet Take 20 mg by mouth every morning.    Yes Historical Provider, MD  gabapentin (NEURONTIN) 300 MG capsule Take 300 mg by mouth 3 (three) times daily. 10/17/13  Yes Historical Provider, MD  hydrochlorothiazide (HYDRODIURIL) 25 MG tablet Take 25 mg by mouth every evening.   Yes Historical Provider, MD  Multiple Vitamin (MULTIVITAMIN WITH MINERALS) TABS tablet Take 1 tablet by mouth daily.   Yes Historical Provider, MD  omeprazole (PRILOSEC) 20 MG capsule Take 20 mg by mouth  daily.   Yes Historical Provider, MD  oxycodone (OXY-IR) 5 MG capsule Take 5 mg by mouth every 4 (four) hours as needed for pain.    Yes Historical Provider, MD  Thiamine HCl (B-1 PO) Take 1 capsule by mouth daily.   Yes Historical Provider, MD  traMADol (ULTRAM) 50 MG tablet Take 50 mg by mouth every 6 (six) hours as needed for moderate pain.   Yes Historical Provider, MD  zolpidem (AMBIEN) 10 MG tablet Take 10 mg by mouth at bedtime.   Yes Historical Provider, MD    Review of Systems  Vital Signs: BP 114/76  Pulse 65  Temp(Src) 97.6 F (36.4 C) (Oral)  Resp 20  Ht 5' 3"  (1.6 m)  Wt 128 lb 4.8 oz (58.196 kg)  BMI 22.73 kg/m2  SpO2 99%  LMP 05/21/2012  Physical Exam A&O x 3 Abd: soft, ND, mildly tender lateral RUQ. No tenderness epigastric region/left lobe of liver area.   Imaging: Ct Abdomen Pelvis W Contrast  04/11/2014   CLINICAL DATA:  Abdominal pain in multiple areas, including right upper quadrant.  EXAM: CT ABDOMEN AND PELVIS WITH CONTRAST  TECHNIQUE: Multidetector CT imaging of the abdomen and pelvis was performed using the standard protocol following bolus administration of intravenous contrast.  CONTRAST:  167m OMNIPAQUE IOHEXOL 300 MG/ML  SOLN  COMPARISON:  12/22/2013  FINDINGS: BODY WALL: Unremarkable.  LOWER CHEST: Unremarkable.  ABDOMEN/PELVIS:  Liver: Liver cirrhosis with small lobulated liver, fissure enlargement, and recannulated periumbilical vein. Two enhancing masses consistent with hepatocellular carcinoma. A 4.8 cm mass  in segment eight appears larger than on December 23, 2013 abdominal CT, but stable in size compared to 12/15/2013 MRI. Differences on CT may be related to imaging phase. The recently treated left hepatic hepatic cellular carcinoma has a shrunken enhancing portion but new medial in inferiorly directed cavitary portion which measures 5.3 cm. The collection contains a few bubbles of anti dependent gas. There is a mildly enhancing capsule around this fluid  component. The fluid collection has mass effect on the upper stomach, without definitive fistulization based on continuity of the gastric mucosa. Stable adenopathy in the deep liver drainage, occluding the porta hepatis and portacaval stations. Lower periesophageal varices.  Biliary: Cholecystectomy. No intrahepatic biliary ductal dilatation suggestive of biliary necrosis.  Pancreas: Unremarkable.  Spleen: Splenomegaly in the setting of portal hypertension.  Adrenals: Unremarkable.  Kidneys and ureters: No hydronephrosis or stone.  Bladder: Unremarkable.  Reproductive: Stable appearance of the uterus and ovaries.  Bowel: No obstruction.  Retroperitoneum: No mass or adenopathy.  Peritoneum: No ascites or pneumoperitoneum.  Vascular: No acute abnormality.  OSSEOUS: No acute abnormalities.  IMPRESSION: 1. Cirrhosis with 2 hepatocellular carcinomas. New 5 cm fluid and gas collection associated with recently treated left lobe mass which could represent abscess, sterile necrosis, or biloma. 2. Stable adenopathy in the deep liver drainage. 3. Portal hypertension with lower esophageal varices.   Electronically Signed   By: Jorje Guild M.D.   On: 04/11/2014 00:00    Labs: Results for orders placed during the hospital encounter of 04/10/14 (from the past 48 hour(s))  CBC WITH DIFFERENTIAL     Status: Abnormal   Collection Time    04/10/14  4:37 PM      Result Value Ref Range   WBC 3.0 (*) 4.0 - 10.5 K/uL   RBC 3.71 (*) 3.87 - 5.11 MIL/uL   Hemoglobin 12.0  12.0 - 15.0 g/dL   HCT 35.1 (*) 36.0 - 46.0 %   MCV 94.6  78.0 - 100.0 fL   MCH 32.3  26.0 - 34.0 pg   MCHC 34.2  30.0 - 36.0 g/dL   RDW 16.5 (*) 11.5 - 15.5 %   Platelets 104 (*) 150 - 400 K/uL   Comment: REPEATED TO VERIFY     SPECIMEN CHECKED FOR CLOTS     PLATELET COUNT CONFIRMED BY SMEAR   Neutrophils Relative % 54  43 - 77 %   Neutro Abs 1.6 (*) 1.7 - 7.7 K/uL   Lymphocytes Relative 29  12 - 46 %   Lymphs Abs 0.9  0.7 - 4.0 K/uL    Monocytes Relative 16 (*) 3 - 12 %   Monocytes Absolute 0.5  0.1 - 1.0 K/uL   Eosinophils Relative 1  0 - 5 %   Eosinophils Absolute 0.0  0.0 - 0.7 K/uL   Basophils Relative 0  0 - 1 %   Basophils Absolute 0.0  0.0 - 0.1 K/uL  COMPREHENSIVE METABOLIC PANEL     Status: Abnormal   Collection Time    04/10/14  4:37 PM      Result Value Ref Range   Sodium 141  137 - 147 mEq/L   Potassium 3.1 (*) 3.7 - 5.3 mEq/L   Chloride 101  96 - 112 mEq/L   CO2 29  19 - 32 mEq/L   Glucose, Bld 86  70 - 99 mg/dL   BUN 13  6 - 23 mg/dL   Creatinine, Ser 1.05  0.50 - 1.10 mg/dL   Calcium 9.1  8.4 - 10.5 mg/dL   Total Protein 7.5  6.0 - 8.3 g/dL   Albumin 2.8 (*) 3.5 - 5.2 g/dL   AST 81 (*) 0 - 37 U/L   ALT 26  0 - 35 U/L   Alkaline Phosphatase 185 (*) 39 - 117 U/L   Total Bilirubin 0.8  0.3 - 1.2 mg/dL   GFR calc non Af Amer 57 (*) >90 mL/min   GFR calc Af Amer 67 (*) >90 mL/min   Comment: (NOTE)     The eGFR has been calculated using the CKD EPI equation.     This calculation has not been validated in all clinical situations.     eGFR's persistently <90 mL/min signify possible Chronic Kidney     Disease.   Anion gap 11  5 - 15  LIPASE, BLOOD     Status: None   Collection Time    04/10/14  4:37 PM      Result Value Ref Range   Lipase 19  11 - 59 U/L  I-STAT TROPOININ, ED     Status: None   Collection Time    04/10/14  5:33 PM      Result Value Ref Range   Troponin i, poc 0.04  0.00 - 0.08 ng/mL   Comment 3            Comment: Due to the release kinetics of cTnI,     a negative result within the first hours     of the onset of symptoms does not rule out     myocardial infarction with certainty.     If myocardial infarction is still suspected,     repeat the test at appropriate intervals.  URINALYSIS, ROUTINE W REFLEX MICROSCOPIC     Status: None   Collection Time    04/11/14 12:02 AM      Result Value Ref Range   Color, Urine YELLOW  YELLOW   APPearance CLEAR  CLEAR   Specific  Gravity, Urine 1.019  1.005 - 1.030   pH 7.0  5.0 - 8.0   Glucose, UA NEGATIVE  NEGATIVE mg/dL   Hgb urine dipstick NEGATIVE  NEGATIVE   Bilirubin Urine NEGATIVE  NEGATIVE   Ketones, ur NEGATIVE  NEGATIVE mg/dL   Protein, ur NEGATIVE  NEGATIVE mg/dL   Urobilinogen, UA 0.2  0.0 - 1.0 mg/dL   Nitrite NEGATIVE  NEGATIVE   Leukocytes, UA NEGATIVE  NEGATIVE   Comment: MICROSCOPIC NOT DONE ON URINES WITH NEGATIVE PROTEIN, BLOOD, LEUKOCYTES, NITRITE, OR GLUCOSE <1000 mg/dL.  PROTIME-INR     Status: Abnormal   Collection Time    04/11/14 12:15 AM      Result Value Ref Range   Prothrombin Time 16.7 (*) 11.6 - 15.2 seconds   INR 1.35  0.00 - 1.49  PROTIME-INR     Status: Abnormal   Collection Time    04/11/14  8:05 AM      Result Value Ref Range   Prothrombin Time 16.6 (*) 11.6 - 15.2 seconds   INR 1.34  0.00 - 3.76  BASIC METABOLIC PANEL     Status: Abnormal   Collection Time    04/11/14  8:05 AM      Result Value Ref Range   Sodium 142  137 - 147 mEq/L   Potassium 3.7  3.7 - 5.3 mEq/L   Chloride 104  96 - 112 mEq/L   CO2 31  19 - 32 mEq/L   Glucose, Bld 67 (*)  70 - 99 mg/dL   BUN 13  6 - 23 mg/dL   Creatinine, Ser 1.03  0.50 - 1.10 mg/dL   Calcium 9.1  8.4 - 10.5 mg/dL   GFR calc non Af Amer 59 (*) >90 mL/min   GFR calc Af Amer 68 (*) >90 mL/min   Comment: (NOTE)     The eGFR has been calculated using the CKD EPI equation.     This calculation has not been validated in all clinical situations.     eGFR's persistently <90 mL/min signify possible Chronic Kidney     Disease.   Anion gap 7  5 - 15  CBC     Status: Abnormal   Collection Time    04/11/14  8:05 AM      Result Value Ref Range   WBC 2.6 (*) 4.0 - 10.5 K/uL   RBC 3.59 (*) 3.87 - 5.11 MIL/uL   Hemoglobin 11.2 (*) 12.0 - 15.0 g/dL   HCT 33.3 (*) 36.0 - 46.0 %   MCV 92.8  78.0 - 100.0 fL   MCH 31.2  26.0 - 34.0 pg   MCHC 33.6  30.0 - 36.0 g/dL   RDW 16.4 (*) 11.5 - 15.5 %   Platelets 82 (*) 150 - 400 K/uL    Comment: CONSISTENT WITH PREVIOUS RESULT    Assessment and Plan: HCC of right and left lobe with dominant left lesion recently treated by transarterial [place] Embolization on 9/3 Reviewed imaging with Dr. Pascal Lux, who performed embo procedure, and he feels this represents a normal response to treatment as a sterile necrosis. Dorothy Burns is afebrile with normal WBC. Would not recommend perc drainage at this time. Discussed with attending MD, but please call Dr. Pascal Lux with further questions at [phone].  Ascencion Dike PA-C Interventional Radiology 04/11/2014 10:47 AM

## 2014-04-11 NOTE — H&P (Signed)
PCP:   Barbette Merino, MD   Chief Complaint:  abd pain  HPI: 58 yo female h/o liver cirrhosis with recent dx of hepatocellular carcinoma and arterial embolization by IR to lesion about 3 weeks ago comes in with ruq abd pain that is worsened since her procedure.  She denies any n/v.  No fevers.  No chills.  She has opted to do no chemo or further treatment of her cancer for now.  Ct shows today gas/fluid collection around site of previous embolization concerning for absess.  IR has been called, and will try draining this in am, wish no abx at this time.    Review of Systems:  Positive and negative as per HPI otherwise all other systems are negative  Past Medical History: Past Medical History  Diagnosis Date  . Hypertension   . Bipolar 1 disorder   . Menorrhagia   . Post-menopausal bleeding     MOST RECENT BLEEDING  WAS NOV 2013  . Paresthesia   . COPD (chronic obstructive pulmonary disease)   . 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 Gout   . Mental disorder     bipolar  . Asthma   . Shortness of breath   . Anemia   . GERD (gastroesophageal reflux disease)   . Gallstones     ABD PAIN, N&V  . Arthritis   . Headache(784.0)   . Hepatitis     Hep C x 10 yrs  - NO TREATMENT  . PONV (postoperative nausea and vomiting)   . Cirrhosis    Past Surgical History  Procedure Laterality Date  . Tubal ligation    . Facial reconstruction surgery    . Multiple tooth extractions    . Hysteroscopy w/d&c  09/29/2011    Procedure: DILATATION AND CURETTAGE /HYSTEROSCOPY;  Surgeon: Mora Bellman, MD;  Location: [facility];  Service: Gynecology;  Laterality: N/A;  . Cholecystectomy  08/20/2012    Procedure: LAPAROSCOPIC CHOLECYSTECTOMY;  Surgeon: Ralene Ok, MD;  Location: [facility];  Service: General;;  . Embolization of dominant hcc within left lobe of liver  03/23/14    Medications: Prior to Admission medications   Medication Sig Start Date End Date Taking? Authorizing Provider   budesonide-formoterol (SYMBICORT) 160-4.5 MCG/ACT inhaler Inhale 2 puffs into the lungs 2 (two) times daily.   Yes Historical Provider, MD  clonazePAM (KLONOPIN) 2 MG tablet Take 2 mg by mouth 2 (two) times daily.   Yes Historical Provider, MD  colchicine 0.6 MG tablet Take 0.6 mg by mouth daily.   Yes Historical Provider, MD  Cyanocobalamin (B-12 PO) Take 1 capsule by mouth daily.   Yes Historical Provider, MD  divalproex (DEPAKOTE) 250 MG DR tablet Take 250 mg by mouth 2 (two) times daily.   Yes Historical Provider, MD  furosemide (LASIX) 20 MG tablet Take 20 mg by mouth every morning.    Yes Historical Provider, MD  gabapentin (NEURONTIN) 300 MG capsule Take 300 mg by mouth 3 (three) times daily. 10/17/13  Yes Historical Provider, MD  hydrochlorothiazide (HYDRODIURIL) 25 MG tablet Take 25 mg by mouth every evening.   Yes Historical Provider, MD  Multiple Vitamin (MULTIVITAMIN WITH MINERALS) TABS tablet Take 1 tablet by mouth daily.   Yes Historical Provider, MD  omeprazole (PRILOSEC) 20 MG capsule Take 20 mg by mouth daily.   Yes Historical Provider, MD  oxycodone (OXY-IR) 5 MG capsule Take 5 mg by mouth every 4 (four) hours as needed for pain.  Yes Historical Provider, MD  Thiamine HCl (B-1 PO) Take 1 capsule by mouth daily.   Yes Historical Provider, MD  traMADol (ULTRAM) 50 MG tablet Take 50 mg by mouth every 6 (six) hours as needed for moderate pain.   Yes Historical Provider, MD  zolpidem (AMBIEN) 10 MG tablet Take 10 mg by mouth at bedtime.   Yes Historical Provider, MD    Allergies:   Allergies  Allergen Reactions  . Tylenol [Acetaminophen]     Liver issues    Social History:  reports that she has been smoking Cigarettes.  She has a 7.5 pack-year smoking history. She has never used smokeless tobacco. She reports that she does not drink alcohol or use illicit drugs.  Family History: Family History  Problem Relation Age of Onset  . COPD Mother   . Diabetes Mother   . Heart  disease Mother   . Diabetes Father   . Heart disease Father   . Hypertension Father   . Cancer Father     basal cell ca and prostate ca    Physical Exam: Filed Vitals:   04/10/14 1655 04/10/14 2220 04/10/14 2230 04/10/14 2300  BP: 112/77 112/77 117/78 114/74  Pulse: 62 70 71 68  Temp: 97.7 F (36.5 C)     TempSrc: Oral     Resp: 16 16 17 13   Height: 5\' 3"  (1.6 m)     Weight: 62.596 kg (138 lb)     SpO2: 97% 98% 98% 98%   General appearance: alert, cooperative and no distress Head: Normocephalic, without obvious abnormality, atraumatic Eyes: negative Nose: Nares normal. Septum midline. Mucosa normal. No drainage or sinus tenderness. Neck: no JVD and supple, symmetrical, trachea midline Lungs: clear to auscultation bilaterally Heart: regular rate and rhythm, S1, S2 normal, no murmur, click, rub or gallop Abdomen: soft, non-tender; bowel sounds normal; no masses,  no organomegaly Extremities: extremities normal, atraumatic, no cyanosis or edema Pulses: 2+ and symmetric Skin: Skin color, texture, turgor normal. No rashes or lesions Neurologic: Grossly normal    Labs on Admission:   Recent Labs  04/10/14 1637  NA 141  K 3.1*  CL 101  CO2 29  GLUCOSE 86  BUN 13  CREATININE 1.05  CALCIUM 9.1    Recent Labs  04/10/14 1637  AST 81*  ALT 26  ALKPHOS 185*  BILITOT 0.8  PROT 7.5  ALBUMIN 2.8*    Recent Labs  04/10/14 1637  LIPASE 19    Recent Labs  04/10/14 1637  WBC 3.0*  NEUTROABS 1.6*  HGB 12.0  HCT 35.1*  MCV 94.6  PLT 104*   Radiological Exams on Admission: Ct Abdomen Pelvis W Contrast  04/11/2014   CLINICAL DATA:  Abdominal pain in multiple areas, including right upper quadrant.  EXAM: CT ABDOMEN AND PELVIS WITH CONTRAST  TECHNIQUE: Multidetector CT imaging of the abdomen and pelvis was performed using the standard protocol following bolus administration of intravenous contrast.  CONTRAST:  157mL OMNIPAQUE IOHEXOL 300 MG/ML  SOLN  COMPARISON:   12/22/2013  FINDINGS: BODY WALL: Unremarkable.  LOWER CHEST: Unremarkable.  ABDOMEN/PELVIS:  Liver: Liver cirrhosis with small lobulated liver, fissure enlargement, and recannulated periumbilical vein. Two enhancing masses consistent with hepatocellular carcinoma. A 4.8 cm mass in segment eight appears larger than on December 23, 2013 abdominal CT, but stable in size compared to 12/15/2013 MRI. Differences on CT may be related to imaging phase. The recently treated left hepatic hepatic cellular carcinoma has a shrunken enhancing portion but  new medial in inferiorly directed cavitary portion which measures 5.3 cm. The collection contains a few bubbles of anti dependent gas. There is a mildly enhancing capsule around this fluid component. The fluid collection has mass effect on the upper stomach, without definitive fistulization based on continuity of the gastric mucosa. Stable adenopathy in the deep liver drainage, occluding the porta hepatis and portacaval stations. Lower periesophageal varices.  Biliary: Cholecystectomy. No intrahepatic biliary ductal dilatation suggestive of biliary necrosis.  Pancreas: Unremarkable.  Spleen: Splenomegaly in the setting of portal hypertension.  Adrenals: Unremarkable.  Kidneys and ureters: No hydronephrosis or stone.  Bladder: Unremarkable.  Reproductive: Stable appearance of the uterus and ovaries.  Bowel: No obstruction.  Retroperitoneum: No mass or adenopathy.  Peritoneum: No ascites or pneumoperitoneum.  Vascular: No acute abnormality.  OSSEOUS: No acute abnormalities.  IMPRESSION: 1. Cirrhosis with 2 hepatocellular carcinomas. New 5 cm fluid and gas collection associated with recently treated left lobe mass which could represent abscess, sterile necrosis, or biloma. 2. Stable adenopathy in the deep liver drainage. 3. Portal hypertension with lower esophageal varices.   Electronically Signed   By: Jorje Guild M.D.   On: 04/11/2014 00:00   Assessment/Plan  58 yo female with  liver cirrhosis/HCC with possible absess s/p recent arterial embolization within last month  Principal Problem:   Abdominal pain, unspecified site-  IR to drain fluid collection in am, hold off on abx at this time unless pt deteriorates overnight.  Keep npo and hold anticoagulants due to procedure in am.  Prn pain meds.  abd exam is benign at this time.  Active Problems:  Stable unless o/w noted   Substance abuse   Anemia of other chronic disease   Liver cirrhosis   Hepatocellular carcinoma  Pt wishes to be full code.  obs on med surgery floor.  Alencia Gordon A 04/11/2014, 12:39 AM

## 2014-04-11 NOTE — ED Notes (Signed)
Pt alert, NAD, calm, interactive, resps e/u, speaking in clear complete sentences, VSS, admits to RLower back pain and some mild dizziness & sob. Denies nausea. Steady gait to b/r. Husband at [facility].

## 2014-04-11 NOTE — Progress Notes (Signed)
Pt refusing bed alarm. Pt educated on falls risk and verbalizes understanding. Will continue to monitor and assess for safety needs

## 2014-04-11 NOTE — Progress Notes (Signed)
[facility] PROGRESS NOTE  ANTANASIA KACZYNSKI NGE:[id number] DOB: 04/01/1956 DOA: 04/10/2014 PCP: Barbette Merino, MD  Assessment/Plan: 58 y.o. female with PMH of Liver Cirrhosis HCC, Hepatitis C, Polysubstance use,  recent bland embolization of the left lateral hepatic artery for treatment of large left HCC tumor who presented to RUQ pain after the procedure. CT done in ED showed New 5 cm fluid and gas collection associated with recently treated left lobe mass; Initially per ER, and admitting physician's discussion IR planned to perform aspiration/drainage of this collection; But on further discussion with IR: Dr. Pascal Lux, who performed embo procedure, and he feels this represents a normal response to treatment as a sterile necrosis - Pt also reports having diarrhea >7-10 times a day without vomiting, no hematochezia, or hematemesis; denies fever, chills, no chest pain, no SOB  1. RUQ pain; CT: New 5 cm fluid and gas collection associated with recently treated left lobe mass; -per IR: Dr. Pascal Lux, who performed embo procedure, and he feels this represents a normal response to treatment as a sterile necrosis -cont pain control, monitor   2. Diarrhea of unclear etiology; +recent atx exposure;  -obtain c diff, stool test'; antiemetics; UIVF as needed  3. Liver Cirrhosis HCC, Hepatitis C; f/u with Dr. Alvy Bimler -not a surgical candidate; s/p bland arterial embolization of the dominant hypervascular mass within the peripheral aspect of the left lobe of the liver -cont home regimen; outpatient oncology follow up  4. Thrombocytopenia due to liver cirrhosis; no s/s of acute bleeding; cont monitor    Code Status: full Family Communication:  D/w patient, her father, daughter  (indicate person spoken with, relationship, and if by phone, the number) Disposition Plan: home 24-48 hrs    Consultants:  IR  Procedures:  none  Antibiotics:  none (indicate start date, and stop date if  known)  HPI/Subjective: alert  Objective: Filed Vitals:   04/11/14 1344  BP: 109/80  Pulse: 66  Temp: 97.8 F (36.6 C)  Resp: 18    Intake/Output Summary (Last 24 hours) at 04/11/14 1630 Last data filed at 04/11/14 1128  Gross per 24 hour  Intake    240 ml  Output      0 ml  Net    240 ml   Filed Weights   04/10/14 1655 04/11/14 0227  Weight: 62.596 kg (138 lb) 58.196 kg (128 lb 4.8 oz)    Exam:   General:  alert  Cardiovascular: s1,s2 rrr  Respiratory: CTA BL  Abdomen: soft, mild tender R UQ; no rebound   Musculoskeletal: no LE edeam   Data Reviewed: Basic Metabolic Panel:  Recent Labs Lab 04/10/14 1637 04/11/14 0805  NA 141 142  K 3.1* 3.7  CL 101 104  CO2 29 31  GLUCOSE 86 67*  BUN 13 13  CREATININE 1.05 1.03  CALCIUM 9.1 9.1   Liver Function Tests:  Recent Labs Lab 04/10/14 1637  AST 81*  ALT 26  ALKPHOS 185*  BILITOT 0.8  PROT 7.5  ALBUMIN 2.8*    Recent Labs Lab 04/10/14 1637  LIPASE 19   No results found for this basename: AMMONIA,  in the last 168 hours CBC:  Recent Labs Lab 04/10/14 1637 04/11/14 0805  WBC 3.0* 2.6*  NEUTROABS 1.6*  --   HGB 12.0 11.2*  HCT 35.1* 33.3*  MCV 94.6 92.8  PLT 104* 82*   Cardiac Enzymes: No results found for this basename: CKTOTAL, CKMB, CKMBINDEX, TROPONINI,  in the last 168 hours BNP (last 3  results) No results found for this basename: PROBNP,  in the last 8760 hours CBG: No results found for this basename: GLUCAP,  in the last 168 hours  No results found for this or any previous visit (from the past 240 hour(s)).   Studies: Ct Abdomen Pelvis W Contrast  04/11/2014   CLINICAL DATA:  Abdominal pain in multiple areas, including right upper quadrant.  EXAM: CT ABDOMEN AND PELVIS WITH CONTRAST  TECHNIQUE: Multidetector CT imaging of the abdomen and pelvis was performed using the standard protocol following bolus administration of intravenous contrast.  CONTRAST:  142mL OMNIPAQUE  IOHEXOL 300 MG/ML  SOLN  COMPARISON:  12/22/2013  FINDINGS: BODY WALL: Unremarkable.  LOWER CHEST: Unremarkable.  ABDOMEN/PELVIS:  Liver: Liver cirrhosis with small lobulated liver, fissure enlargement, and recannulated periumbilical vein. Two enhancing masses consistent with hepatocellular carcinoma. A 4.8 cm mass in segment eight appears larger than on December 23, 2013 abdominal CT, but stable in size compared to 12/15/2013 MRI. Differences on CT may be related to imaging phase. The recently treated left hepatic hepatic cellular carcinoma has a shrunken enhancing portion but new medial in inferiorly directed cavitary portion which measures 5.3 cm. The collection contains a few bubbles of anti dependent gas. There is a mildly enhancing capsule around this fluid component. The fluid collection has mass effect on the upper stomach, without definitive fistulization based on continuity of the gastric mucosa. Stable adenopathy in the deep liver drainage, occluding the porta hepatis and portacaval stations. Lower periesophageal varices.  Biliary: Cholecystectomy. No intrahepatic biliary ductal dilatation suggestive of biliary necrosis.  Pancreas: Unremarkable.  Spleen: Splenomegaly in the setting of portal hypertension.  Adrenals: Unremarkable.  Kidneys and ureters: No hydronephrosis or stone.  Bladder: Unremarkable.  Reproductive: Stable appearance of the uterus and ovaries.  Bowel: No obstruction.  Retroperitoneum: No mass or adenopathy.  Peritoneum: No ascites or pneumoperitoneum.  Vascular: No acute abnormality.  OSSEOUS: No acute abnormalities.  IMPRESSION: 1. Cirrhosis with 2 hepatocellular carcinomas. New 5 cm fluid and gas collection associated with recently treated left lobe mass which could represent abscess, sterile necrosis, or biloma. 2. Stable adenopathy in the deep liver drainage. 3. Portal hypertension with lower esophageal varices.   Electronically Signed   By: Jorje Guild M.D.   On: 04/11/2014 00:00     Scheduled Meds: . feeding supplement (ENSURE COMPLETE)  237 mL Oral BID BM  . [START ON 04/12/2014] Influenza vac split quadrivalent PF  0.5 mL Intramuscular Tomorrow-1000  . nicotine  7 mg Transdermal Q24H  . [START ON 04/12/2014] pneumococcal 23 valent vaccine  0.5 mL Intramuscular Tomorrow-1000  . sodium chloride  3 mL Intravenous Q12H   Continuous Infusions:   Principal Problem:   Abdominal pain, unspecified site Active Problems:   Substance abuse   Anemia of other chronic disease   Liver cirrhosis   Hepatocellular carcinoma    Time spent: >35 minutes     Kinnie Feil  [facility] Pager [phone]. If 7PM-7AM, please contact night-coverage at [email or web address], password [facility] 04/11/2014, 4:30 PM  LOS: 1 day

## 2014-04-11 NOTE — ED Provider Notes (Signed)
Pt seen and evaluated.  C/O abdominal pain, RUQ.  Recent (9/1) IR embolization of liver mass (presumed HCCa).  Pain over last several days.  Afebrile, WBC 3.0.  CT with fluid, and air adjacent prior embolization site. DDx includes infection, bileoma, necrosis/sterile abscess.  D/W Radiologist.  Will place order for am IR Ct drainage of fluid for culture.  Coagulation studies being drawn.  Will ask Ho[facility]italist for admission.  Tanna Furry, MD 04/11/14 [phone]

## 2014-04-11 NOTE — Progress Notes (Signed)
INITIAL NUTRITION ASSESSMENT  DOCUMENTATION CODES Per approved criteria  -Severe malnutrition in the context of chronic illness  Pt meets criteria for severe MALNUTRITION in the context of chronic illness as evidenced by 16% body weight loss in 3 months, PO intake likely <75% for > one month, severe subcutaneous and muscle wasting in occipital and temporal region  INTERVENTION: -Recommend Ensure Complete po BID, each supplement provides 350 kcal and 13 grams of protein -Provided pt with nutrition supplement coupons -Consider Social Work Consult as pt concerned with obtaining adequate nutrition at home -RD to continue to monitor  NUTRITION DIAGNOSIS: Inadequate oral intake related to abd pain/diarrhea as evidenced by ongoing wt loss, PO intake < 75%.   Goal: Pt to meet >/= 90% of their estimated nutrition needs    Monitor:  Total protein/energy intake, labs, weights, discharge nutrition needs  Reason for Assessment: MST  58 y.o. female  Admitting Dx: Abdominal pain, unspecified site  ASSESSMENT: 58 yo female h/o liver cirrhosis with recent dx of hepatocellular carcinoma and arterial embolization by IR to lesion about 3 weeks ago comes in with ruq abd pain that is worsened since her procedure  -Pt reported decreased intake d/t feelings of abd pain, weakness, and lose stools.  -Pt was slightly lethargic and did not fully elaborate on diet recall; however it was indicated that pt consumes small snacks, such as graham crackers and peanut butter, and does not eat full meals.  -Tried chocolate Boost before, but disliked taste. Was willing to trial vanilla Ensure. Provided pt with nutrition supplement coupons -Pt concerned with obtaining adequate nutrition upon d/c, and was interested in learning more about food assistance programs. Recommend social work consult for additional help with resources and aid -Endorsed ongoing weight loss. Has lost approximately 25 lbs in past 3 months (16%  body weight loss, severe for time frame). Has lost approximately 40 lbs since 07/2013 -Noted appetite was improving during admit, eating > 50% of meals.  -Pt was evident severe orbital subcutaneous fat loss and muscle wasting in temple region   Height: Ht Readings from Last 1 Encounters:  04/11/14 5\' 3"  (1.6 m)    Weight: Wt Readings from Last 1 Encounters:  04/11/14 128 lb 4.8 oz (58.196 kg)    Ideal Body Weight: 115 lbs  % Ideal Body Weight: 111%  Wt Readings from Last 10 Encounters:  04/11/14 128 lb 4.8 oz (58.196 kg)  03/23/14 140 lb (63.504 kg)  01/25/14 152 lb (68.947 kg)  01/16/14 152 lb (68.947 kg)  12/29/13 156 lb (70.762 kg)  12/23/13 154 lb 14.4 oz (70.262 kg)  12/22/13 156 lb (70.761 kg)  12/16/13 156 lb 14.4 oz (71.169 kg)  12/07/13 155 lb 1.6 oz (70.353 kg)  08/18/13 167 lb (75.751 kg)    Usual Body Weight: 169 lb  % Usual Body Weight: 76%  BMI:  Body mass index is 22.73 kg/(m^2).  Estimated Nutritional Needs: Kcal: 4098-1191 Protein: 70-80 gram Fluid: >/=1800 ml daily  Skin: WDL  Diet Order: Sodium Restricted  EDUCATION NEEDS: -No education needs identified at this time   Intake/Output Summary (Last 24 hours) at 04/11/14 1334 Last data filed at 04/11/14 1128  Gross per 24 hour  Intake    240 ml  Output      0 ml  Net    240 ml    Last BM: 9/21   Labs:   Recent Labs Lab 04/10/14 1637 04/11/14 0805  NA 141 142  K 3.1* 3.7  CL  101 104  CO2 29 31  BUN 13 13  CREATININE 1.05 1.03  CALCIUM 9.1 9.1  GLUCOSE 86 67*    CBG (last 3)  No results found for this basename: GLUCAP,  in the last 72 hours  Scheduled Meds: . feeding supplement (ENSURE COMPLETE)  237 mL Oral BID BM  . [START ON 04/12/2014] Influenza vac split quadrivalent PF  0.5 mL Intramuscular Tomorrow-1000  . nicotine  7 mg Transdermal Q24H  . [START ON 04/12/2014] pneumococcal 23 valent vaccine  0.5 mL Intramuscular Tomorrow-1000  . sodium chloride  3 mL Intravenous  Q12H    Continuous Infusions:   Past Medical History  Diagnosis Date  . Hypertension   . Bipolar 1 disorder   . Menorrhagia   . Post-menopausal bleeding     MOST RECENT BLEEDING  WAS NOV 2013  . Paresthesia   . COPD (chronic obstructive pulmonary disease)   . 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 Gout   . Mental disorder     bipolar  . Asthma   . Shortness of breath   . Anemia   . GERD (gastroesophageal reflux disease)   . Gallstones     ABD PAIN, N&V  . Arthritis   . Headache(784.0)   . Hepatitis     Hep C x 10 yrs  - NO TREATMENT  . PONV (postoperative nausea and vomiting)   . Cirrhosis     Past Surgical History  Procedure Laterality Date  . Tubal ligation    . Facial reconstruction surgery    . Multiple tooth extractions    . Hysteroscopy w/d&c  09/29/2011    Procedure: DILATATION AND CURETTAGE /HYSTEROSCOPY;  Surgeon: Mora Bellman, MD;  Location: [facility];  Service: Gynecology;  Laterality: N/A;  . Cholecystectomy  08/20/2012    Procedure: LAPAROSCOPIC CHOLECYSTECTOMY;  Surgeon: Ralene Ok, MD;  Location: [facility];  Service: General;;  . Embolization of dominant hcc within left lobe of liver  03/23/14    Atlee Abide [place] LDN Clinical Dietitian [phone]

## 2014-04-11 NOTE — Progress Notes (Signed)
On call physician paged for nicotine patch per pt request.

## 2014-04-11 NOTE — Progress Notes (Signed)
Pt arrive to unit in no s/s of distress. Pt A&Ox4. Pt oriented to unit and room. Pt's father at bedside. VS stable. Report received from ED nurse prior to pt's arrival. Whiteboard updated. Callbell within reach. Will continue to monitor.

## 2014-04-11 NOTE — Progress Notes (Signed)
UR completed 

## 2014-04-11 NOTE — Consult Note (Signed)
As is her baseline, the pt is somewhat confused and is a poor historian, though states she presented to the ED following a fall and worsening low back pain.  The patient does state she has had some diffuse abdominal pain but does not report this is in anyway localized to her right upper quadrant.  No fever or chills.  Pt denies diarrhea, bloody or melanotic stool.  No nausea or vomitting.   Pt is non-tender to palpation of the abdomen, specifically, she is not tender with palpation of the right upper abd qudrant.  No guarding.  No fluid wave.  Review of CT scan performed 9/21 demonstrates an excellent response to bland embolization of the dominant lesion within the left lobe of the liver with very minimal amount of peripheral nodular enhancement.  A tiny amount of air within the necrotic lesion is also an expected findings.  The imaging findings are supported by the pt's normal WBC and lack of a fever.     Pt is to f/u in IR clinic [phone]) approximately 1 month following the bland embolization (beginning of October) for repeat CMP and evaluation prior to proceeding with bland embolization of the remaining lesion within the right lobe of the liver.

## 2014-04-11 NOTE — ED Notes (Signed)
Attempted report X2 

## 2014-04-12 DIAGNOSIS — K7460 Unspecified cirrhosis of liver: Secondary | ICD-10-CM | POA: Diagnosis present

## 2014-04-12 DIAGNOSIS — D638 Anemia in other chronic diseases classified elsewhere: Secondary | ICD-10-CM | POA: Diagnosis present

## 2014-04-12 DIAGNOSIS — Z23 Encounter for immunization: Secondary | ICD-10-CM | POA: Diagnosis not present

## 2014-04-12 DIAGNOSIS — E43 Unspecified severe protein-calorie malnutrition: Secondary | ICD-10-CM | POA: Insufficient documentation

## 2014-04-12 DIAGNOSIS — F17200 Nicotine dependence, unspecified, uncomplicated: Secondary | ICD-10-CM | POA: Diagnosis present

## 2014-04-12 DIAGNOSIS — I10 Essential (primary) hypertension: Secondary | ICD-10-CM | POA: Diagnosis present

## 2014-04-12 DIAGNOSIS — G8929 Other chronic pain: Secondary | ICD-10-CM | POA: Diagnosis present

## 2014-04-12 DIAGNOSIS — M109 Gout, unspecified: Secondary | ICD-10-CM | POA: Diagnosis present

## 2014-04-12 DIAGNOSIS — R1011 Right upper quadrant pain: Secondary | ICD-10-CM | POA: Diagnosis present

## 2014-04-12 DIAGNOSIS — B1920 Unspecified viral hepatitis C without hepatic coma: Secondary | ICD-10-CM | POA: Diagnosis present

## 2014-04-12 DIAGNOSIS — K766 Portal hypertension: Secondary | ICD-10-CM | POA: Diagnosis present

## 2014-04-12 DIAGNOSIS — D6959 Other secondary thrombocytopenia: Secondary | ICD-10-CM | POA: Diagnosis present

## 2014-04-12 DIAGNOSIS — K219 Gastro-esophageal reflux disease without esophagitis: Secondary | ICD-10-CM | POA: Diagnosis present

## 2014-04-12 DIAGNOSIS — Z79899 Other long term (current) drug therapy: Secondary | ICD-10-CM | POA: Diagnosis not present

## 2014-04-12 DIAGNOSIS — C228 Malignant neoplasm of liver, primary, unspecified as to type: Secondary | ICD-10-CM | POA: Diagnosis present

## 2014-04-12 DIAGNOSIS — F319 Bipolar disorder, unspecified: Secondary | ICD-10-CM | POA: Diagnosis present

## 2014-04-12 DIAGNOSIS — IMO0002 Reserved for concepts with insufficient information to code with codable children: Secondary | ICD-10-CM | POA: Diagnosis not present

## 2014-04-12 DIAGNOSIS — J449 Chronic obstructive pulmonary disease, unspecified: Secondary | ICD-10-CM | POA: Diagnosis present

## 2014-04-12 LAB — GI PATHOGEN PANEL BY PCR, STOOL
C difficile toxin A/B: NEGATIVE
Campylobacter by PCR: NEGATIVE
Cryptosporidium by PCR: NEGATIVE
E coli (ETEC) LT/ST: NEGATIVE
E coli (STEC): NEGATIVE
E coli 0157 by PCR: NEGATIVE
G LAMBLIA BY PCR: NEGATIVE
Norovirus GI/GII: NEGATIVE
ROTAVIRUS A BY PCR: NEGATIVE
SALMONELLA BY PCR: NEGATIVE
Shigella by PCR: NEGATIVE

## 2014-04-12 NOTE — Progress Notes (Signed)
04/12/14 1420  What Happened  Was fall witnessed? Yes  Who witnessed fall? Dr Wynelle Cleveland  Patients activity before fall to/from bed, chair, or stretcher (standing in front of sink)  Point of contact buttocks  Was patient injured? No  Follow Up  MD notified dr Wynelle Cleveland present  Time MD notified 28  Family notified Yes-comment  Time family notified 64 (father at bedside)  Additional tests No  Simple treatment (none)  Adult Fall Risk Assessment  Risk Factor Category (scoring not indicated) History of more than one fall within 6 months before admission (document High fall risk)  Age 58  Fall History: Fall within 6 months prior to admission 5  Elimination; Bowel and/or Urine Incontinence 0  Elimination; Bowel and/or Urine Urgency/Frequency 0  Medications: includes PCA/Opiates, Anti-convulsants, Anti-hypertensives, Diuretics, Hypnotics, Laxatives, Sedatives, and Psychotropics 5  Patient Care Equipment 0  Mobility-Assistance 2  Mobility-Gait 2  Mobility-Sensory Deficit 2  Cognition-Awareness 0  Cognition-Impulsiveness 0  Cognition-Limitations 0  Total Score 16  Patient's Fall Risk High Fall Risk (>13 points)  Adult Fall Risk Interventions  Required Bundle Interventions *See Row Information* High fall risk - low, moderate, and high requirements implemented  Additional Interventions Bed alarm not indicated with the bundle;Fall risk signage;Individualized elimination schedule;Room near nurses station

## 2014-04-12 NOTE — Progress Notes (Addendum)
[facility] PROGRESS NOTE  KELA BACCARI PJK:[id number] DOB: 1956/03/07 DOA: 04/10/2014 PCP: Barbette Merino, MD  Assessment/Plan: 58 y.o. female with PMH of Liver Cirrhosis HCC, Hepatitis C, Polysubstance use,  recent bland embolization of the left lateral hepatic artery for treatment of large left HCC tumor who presented to RUQ pain after the procedure. CT done in ED showed New 5 cm fluid and gas collection associated with recently treated left lobe mass; Initially per ER, and admitting physician's discussion IR planned to perform aspiration/drainage of this collection; But on further discussion with IR: Dr. Pascal Lux, who performed embo procedure, and he feels this represents a normal response to treatment as a sterile necrosis - Pt also reports having diarrhea >7-10 times a day without vomiting, no hematochezia, or hematemesis; denies fever, chills, no chest pain, no SOB  1. RUQ pain; CT: New 5 cm fluid and gas collection associated with recently treated left lobe mass; -per IR: Dr. Pascal Lux, who performed embo procedure, and he feels this represents a normal response to treatment as a sterile necrosis -pain has improved- takes Oxycodone at home for pain- has been resumed here.   2. Diarrhea of unclear etiology; +recent atx exposure;  -resolving- c diff negative- pathogen panel pending  3. Liver Cirrhosis HCC, Hepatitis C; f/u with Dr. Alvy Bimler -not a surgical candidate; s/p bland arterial embolization of the dominant hypervascular mass within the peripheral aspect of the left lobe of the liver -cont home regimen; outpatient oncology follow up   4. Thrombocytopenia due to liver cirrhosis; no s/s of acute bleeding; cont monitor    Code Status: full Family Communication:  D/w patient and  father Disposition Plan: home 24-48 hrs    Consultants:  IR  Procedures:  none  Antibiotics:  none (indicate start date, and stop date if known)  HPI/Subjective: alert  Objective: Filed  Vitals:   04/12/14 1422  BP: 98/66  Pulse: 63  Temp: 97.8 F (36.6 C)  Resp: 18    Intake/Output Summary (Last 24 hours) at 04/12/14 1527 Last data filed at 04/11/14 2100  Gross per 24 hour  Intake    240 ml  Output      0 ml  Net    240 ml   Filed Weights   04/10/14 1655 04/11/14 0227  Weight: 62.596 kg (138 lb) 58.196 kg (128 lb 4.8 oz)    Exam:   General:  alert  Cardiovascular: s1,s2 rrr  Respiratory: CTA BL  Abdomen: soft, mild tender R UQ; no rebound   Musculoskeletal: no LE edeam   Data Reviewed: Basic Metabolic Panel:  Recent Labs Lab 04/10/14 1637 04/11/14 0805  NA 141 142  K 3.1* 3.7  CL 101 104  CO2 29 31  GLUCOSE 86 67*  BUN 13 13  CREATININE 1.05 1.03  CALCIUM 9.1 9.1   Liver Function Tests:  Recent Labs Lab 04/10/14 1637  AST 81*  ALT 26  ALKPHOS 185*  BILITOT 0.8  PROT 7.5  ALBUMIN 2.8*    Recent Labs Lab 04/10/14 1637  LIPASE 19   No results found for this basename: AMMONIA,  in the last 168 hours CBC:  Recent Labs Lab 04/10/14 1637 04/11/14 0805  WBC 3.0* 2.6*  NEUTROABS 1.6*  --   HGB 12.0 11.2*  HCT 35.1* 33.3*  MCV 94.6 92.8  PLT 104* 82*   Cardiac Enzymes: No results found for this basename: CKTOTAL, CKMB, CKMBINDEX, TROPONINI,  in the last 168 hours BNP (last 3 results) No results found  for this basename: PROBNP,  in the last 8760 hours CBG: No results found for this basename: GLUCAP,  in the last 168 hours  Recent Results (from the past 240 hour(s))  CLOSTRIDIUM DIFFICILE BY PCR     Status: None   Collection Time    04/11/14  3:07 PM      Result Value Ref Range Status   C difficile by pcr NEGATIVE  NEGATIVE Final     Studies: Ct Abdomen Pelvis W Contrast  04/11/2014   CLINICAL DATA:  Abdominal pain in multiple areas, including right upper quadrant.  EXAM: CT ABDOMEN AND PELVIS WITH CONTRAST  TECHNIQUE: Multidetector CT imaging of the abdomen and pelvis was performed using the standard protocol  following bolus administration of intravenous contrast.  CONTRAST:  171mL OMNIPAQUE IOHEXOL 300 MG/ML  SOLN  COMPARISON:  12/22/2013  FINDINGS: BODY WALL: Unremarkable.  LOWER CHEST: Unremarkable.  ABDOMEN/PELVIS:  Liver: Liver cirrhosis with small lobulated liver, fissure enlargement, and recannulated periumbilical vein. Two enhancing masses consistent with hepatocellular carcinoma. A 4.8 cm mass in segment eight appears larger than on December 23, 2013 abdominal CT, but stable in size compared to 12/15/2013 MRI. Differences on CT may be related to imaging phase. The recently treated left hepatic hepatic cellular carcinoma has a shrunken enhancing portion but new medial in inferiorly directed cavitary portion which measures 5.3 cm. The collection contains a few bubbles of anti dependent gas. There is a mildly enhancing capsule around this fluid component. The fluid collection has mass effect on the upper stomach, without definitive fistulization based on continuity of the gastric mucosa. Stable adenopathy in the deep liver drainage, occluding the porta hepatis and portacaval stations. Lower periesophageal varices.  Biliary: Cholecystectomy. No intrahepatic biliary ductal dilatation suggestive of biliary necrosis.  Pancreas: Unremarkable.  Spleen: Splenomegaly in the setting of portal hypertension.  Adrenals: Unremarkable.  Kidneys and ureters: No hydronephrosis or stone.  Bladder: Unremarkable.  Reproductive: Stable appearance of the uterus and ovaries.  Bowel: No obstruction.  Retroperitoneum: No mass or adenopathy.  Peritoneum: No ascites or pneumoperitoneum.  Vascular: No acute abnormality.  OSSEOUS: No acute abnormalities.  IMPRESSION: 1. Cirrhosis with 2 hepatocellular carcinomas. New 5 cm fluid and gas collection associated with recently treated left lobe mass which could represent abscess, sterile necrosis, or biloma. 2. Stable adenopathy in the deep liver drainage. 3. Portal hypertension with lower esophageal  varices.   Electronically Signed   By: Jorje Guild M.D.   On: 04/11/2014 00:00    Scheduled Meds: . colchicine  0.6 mg Oral Daily  . divalproex  250 mg Oral Q12H  . feeding supplement (ENSURE COMPLETE)  237 mL Oral BID BM  . furosemide  20 mg Oral Daily  . gabapentin  300 mg Oral TID  . Influenza vac split quadrivalent PF  0.5 mL Intramuscular Tomorrow-1000  . nicotine  7 mg Transdermal Q24H  . pantoprazole  40 mg Oral Daily  . pneumococcal 23 valent vaccine  0.5 mL Intramuscular Tomorrow-1000  . sodium chloride  3 mL Intravenous Q12H   Continuous Infusions:   Principal Problem:   Abdominal pain, unspecified site Active Problems:   Substance abuse   Anemia of other chronic disease   Liver cirrhosis   Hepatocellular carcinoma   Protein-calorie malnutrition, severe    Time spent: >35 minutes     Prien  [facility]  If 7PM-7AM, please contact night-coverage at [email or web address], password [facility] 04/12/2014, 3:27 PM  LOS: 2 days

## 2014-04-13 DIAGNOSIS — C228 Malignant neoplasm of liver, primary, unspecified as to type: Secondary | ICD-10-CM | POA: Diagnosis not present

## 2014-04-13 DIAGNOSIS — R109 Unspecified abdominal pain: Secondary | ICD-10-CM

## 2014-04-13 NOTE — Evaluation (Signed)
Occupational Therapy Evaluation Patient Details Name: Dorothy Burns MRN: [id number] DOB: 30-Sep-1955 Today's Date: 04/13/2014    History of Present Illness Abd pain; 58 yo female h/o liver cirrhosis with recent dx of hepatocellular carcinoma and arterial embolization by IR to lesion about 3 weeks ago comes in with ruq abd pain that is worsened since her procedure. Per radiology she this can be a normal symptom of this procedure. PMHx: multiple falls (one while here), COPD, HTN, Bi-polar   Clinical Impression   This 58 yo female admitted with above presents to acute OT with a very cachetic appearance, increased history of falls, and generalized weakness thus affecting her ability to care for herself at home (lives alone). She would benefit from a short SNF stay to get as strong as she can and as independent as she can before hopefully returning home alone.    Follow Up Recommendations  SNF    Equipment Recommendations   (TBD at next venue; if home then a tub seat)       Precautions / Restrictions Precautions Precautions: Fall Restrictions Weight Bearing Restrictions: No      Mobility Bed Mobility Overal bed mobility: Modified Independent                Transfers Overall transfer level: Needs assistance Equipment used: Rolling walker (2 wheeled) Transfers: Sit to/from Stand Sit to Stand: Min guard              Balance Overall balance assessment: Needs assistance Sitting-balance support: No upper extremity supported;Feet supported Sitting balance-Leahy Scale: Good     Standing balance support: Single extremity supported Standing balance-Leahy Scale: Fair                              ADL Overall ADL's : Needs assistance/impaired Eating/Feeding: Independent;Sitting   Grooming: Wash/dry hands;Min guard;Standing   Upper Body Bathing: Set up;Sitting   Lower Body Bathing: Set up (with min guard A sit<>stand)   Upper Body Dressing : Set  up;Sitting   Lower Body Dressing: Set up (with min guard A sit<>stand)   Toilet Transfer: Minimal assistance;Ambulation;Comfort height toilet;Grab bars   Toileting- Clothing Manipulation and Hygiene: Min guard;Sit to/from stand                        Pertinent Vitals/Pain Pain Assessment: 0-10 Pain Score: 4  Pain Location: low back and right her under her right breast (where procedure was performed a few weeks ago) Pain Intervention(s): Monitored during session     Hand Dominance Right   Extremity/Trunk Assessment Upper Extremity Assessment Upper Extremity Assessment: Overall WFL for tasks assessed           Communication Communication Communication: No difficulties   Cognition Arousal/Alertness: Awake/alert Behavior During Therapy: WFL for tasks assessed/performed Overall Cognitive Status: Within Functional Limits for tasks assessed (labile and hard of hearing)                                Home Living Family/patient expects to be discharged to:: Private residence Living Arrangements: Alone Available Help at Discharge: Family;Available PRN/intermittently (daughter and father but do not live with her (when I asked her father if he could stay with her he said, "no, I have my own place") Type of Home: Apartment Home Access: Ramped entrance     Home Layout: One level  Bathroom Shower/Tub: Tub/shower unit;Curtain (she has fallen in tub and was not able to get out for over 2 hours per her report) Shower/tub characteristics: Curtain Biochemist, clinical: Standard     Home Equipment: Environmental consultant - 4 wheels;Bedside commode;Wheelchair - power          Prior Functioning/Environment Level of Independence: Independent with assistive device(s)        Comments: She said that she has tried to get in home A for IADLs, but was turned down by the state    OT Diagnosis: Generalized weakness;Acute pain   OT Problem List: Decreased strength;Impaired balance  (sitting and/or standing);Pain   OT Treatment/Interventions: Self-care/ADL training;Patient/family education;Balance training;DME and/or AE instruction    OT Goals(Current goals can be found in the care plan section) Acute Rehab OT Goals Patient Stated Goal: "I am just not sure what to do, this is over whelming" (thus I have recommended a [place]) OT Goal Formulation: With patient Time For Goal Achievement: 04/20/14 Potential to Achieve Goals: Good  OT Frequency: Min 2X/week   Barriers to D/C: Decreased caregiver support             End of Session Equipment Utilized During Treatment: Gait belt;Rolling walker Nurse Communication:  (I am recommending a Pallative care consult and OT at SNF)  Activity Tolerance: Patient tolerated treatment well Patient left: with family/visitor present (sitting EOB with bed alarm on )   Time: 0272-5366 OT Time Calculation (min): 34 min Charges:  OT General Charges $OT Visit: 1 Procedure OT Evaluation $Initial OT Evaluation Tier I: 1 Procedure OT Treatments $Self Care/Home Management : 23-37 mins  Almon Register [phone] 04/13/2014, 3:02 PM

## 2014-04-13 NOTE — Care Management Note (Signed)
    Page 1 of 1   04/18/2014     10:08:08 AM CARE MANAGEMENT NOTE 04/18/2014  Patient:  Dorothy Burns, Dorothy Burns   Account Number:  [id number]  Date Initiated:  04/11/2014  Documentation initiated by:  [facility]  Subjective/Objective Assessment:   abdominal pain     Action/Plan:   pt eval   Anticipated DC Date:  04/17/2014   Anticipated DC Plan:  [place]  CM consult      Choice offered to / List presented to:             Status of service:  Completed, signed off Medicare Important Message given?  YES (If response is "NO", the following Medicare IM given date fields will be blank) Date Medicare IM given:  04/13/2014 Medicare IM given by:  Tomi Bamberger Date Additional Medicare IM given:  04/17/2014 Additional Medicare IM given by:  Tomi Bamberger  Discharge Disposition:  [place]  Per UR Regulation:  Reviewed for med. necessity/level of care/duration of stay  If discussed at [place] of Stay Meetings, dates discussed:    Comments:  04/17/14 [place], BSN [phone] patient dc to snf, CSW following.  04/13/14 [place], BSN [phone] patient lives alone, await pt eval.

## 2014-04-13 NOTE — Progress Notes (Signed)
[facility] PROGRESS NOTE  ROSSETTA KAMA GMW:[id number] DOB: 08/20/1955 DOA: 04/10/2014 PCP: Barbette Merino, MD  Assessment/Plan: 58 y.o. female with PMH of Liver Cirrhosis HCC, Hepatitis C, Polysubstance use,  recent bland embolization of the left lateral hepatic artery for treatment of large left HCC tumor who presented to RUQ pain after the procedure. CT done in ED showed New 5 cm fluid and gas collection associated with recently treated left lobe mass; Initially per ER, and admitting physician's discussion IR planned to perform aspiration/drainage of this collection; But on further discussion with IR: Dr. Pascal Lux, who performed embo procedure, and he feels this represents a normal response to treatment as a sterile necrosis - Pt also reports having diarrhea >7-10 times a day without vomiting, no hematochezia, or hematemesis; denies fever, chills, no chest pain, no SOB  1. RUQ pain; CT: New 5 cm fluid and gas collection associated with recently treated left lobe mass; -per IR: Dr. Pascal Lux, who performed embo procedure, and he feels this represents a normal response to treatment as a sterile necrosis -pain has improved- takes Oxycodone at home for pain- has been resumed here.   2. Diarrhea of unclear etiology; +recent atx exposure;  -resolving- c diff negative- pathogen panel negative.   3. Liver Cirrhosis HCC, Hepatitis C; f/u with Dr. Alvy Bimler -not a surgical candidate; s/p bland arterial embolization of the dominant hypervascular mass within the peripheral aspect of the left lobe of the liver -cont home regimen; outpatient oncology follow up   4. Thrombocytopenia due to liver cirrhosis; no s/s of acute bleeding; cont monitor   Disposition: patient very weak. PT evaluation ordered for disposition.    Code Status: full Family Communication:  D/w patient and  father Disposition Plan: home 24-48 hrs    Consultants:  IR  Procedures:  none  Antibiotics:  none (indicate start  date, and stop date if known)  HPI/Subjective: Alert, feels weak.   Objective: Filed Vitals:   04/13/14 1322  BP: 125/69  Pulse: 68  Temp: 98.2 F (36.8 C)  Resp: 16    Intake/Output Summary (Last 24 hours) at 04/13/14 1802 Last data filed at 04/13/14 1559  Gross per 24 hour  Intake   1182 ml  Output      0 ml  Net   1182 ml   Filed Weights   04/10/14 1655 04/11/14 0227  Weight: 62.596 kg (138 lb) 58.196 kg (128 lb 4.8 oz)    Exam:   General:  alert  Cardiovascular: s1,s2 rrr  Respiratory: CTA BL  Abdomen: soft, mild tender R UQ; no rebound   Musculoskeletal: no LE edeam   Data Reviewed: Basic Metabolic Panel:  Recent Labs Lab 04/10/14 1637 04/11/14 0805  NA 141 142  K 3.1* 3.7  CL 101 104  CO2 29 31  GLUCOSE 86 67*  BUN 13 13  CREATININE 1.05 1.03  CALCIUM 9.1 9.1   Liver Function Tests:  Recent Labs Lab 04/10/14 1637  AST 81*  ALT 26  ALKPHOS 185*  BILITOT 0.8  PROT 7.5  ALBUMIN 2.8*    Recent Labs Lab 04/10/14 1637  LIPASE 19   No results found for this basename: AMMONIA,  in the last 168 hours CBC:  Recent Labs Lab 04/10/14 1637 04/11/14 0805  WBC 3.0* 2.6*  NEUTROABS 1.6*  --   HGB 12.0 11.2*  HCT 35.1* 33.3*  MCV 94.6 92.8  PLT 104* 82*   Cardiac Enzymes: No results found for this basename: CKTOTAL, CKMB, CKMBINDEX, TROPONINI,  in the last 168 hours BNP (last 3 results) No results found for this basename: PROBNP,  in the last 8760 hours CBG: No results found for this basename: GLUCAP,  in the last 168 hours  Recent Results (from the past 240 hour(s))  CLOSTRIDIUM DIFFICILE BY PCR     Status: None   Collection Time    04/11/14  3:07 PM      Result Value Ref Range Status   C difficile by pcr NEGATIVE  NEGATIVE Final     Studies: No results found.  Scheduled Meds: . colchicine  0.6 mg Oral Daily  . divalproex  250 mg Oral Q12H  . feeding supplement (ENSURE COMPLETE)  237 mL Oral BID BM  . furosemide  20  mg Oral Daily  . gabapentin  300 mg Oral TID  . nicotine  7 mg Transdermal Q24H  . pantoprazole  40 mg Oral Daily  . sodium chloride  3 mL Intravenous Q12H   Continuous Infusions:   Principal Problem:   Abdominal pain, unspecified site Active Problems:   Substance abuse   Anemia of other chronic disease   Liver cirrhosis   Hepatocellular carcinoma   Protein-calorie malnutrition, severe   RUQ pain    Time spent: >35 minutes     Holston Oyama  [facility]  If 7PM-7AM, please contact night-coverage at [email or web address], password [facility] 04/13/2014, 6:02 PM  LOS: 3 days

## 2014-04-13 NOTE — ED Provider Notes (Signed)
I saw and evaluated the patient, reviewed the resident's note and I agree with the findings and plan.   EKG Interpretation   Date/Time:  Monday April 10 2014 16:51:23 EDT Ventricular Rate:  62 PR Interval:  146 QRS Duration: 82 QT Interval:  424 QTC Calculation: 430 R Axis:   37 Text Interpretation:  Normal sinus rhythm Nonspecific ST abnormality  Abnormal ECG ED PHYSICIAN INTERPRETATION AVAILABLE IN [facility]  Confirmed by TEST, Record ([id number]) on 04/12/2014 7:03:21 AM      Please see my additional dictation   Tanna Furry, MD 04/13/14 1106

## 2014-04-14 LAB — AMMONIA: Ammonia: 39 umol/L (ref 11–60)

## 2014-04-14 MED ORDER — OXYCODONE HCL 5 MG PO TABS
5.0000 mg | ORAL_TABLET | ORAL | Status: DC | PRN
  Administered 2014-04-14 – 2014-04-17 (×14): 5 mg via ORAL
  Filled 2014-04-14 (×14): qty 1

## 2014-04-14 NOTE — Clinical Social Work Psychosocial (Signed)
Clinical Social Work Department BRIEF PSYCHOSOCIAL ASSESSMENT 04/14/2014  Patient:  Dorothy Burns, Dorothy Burns     Account Number:  [id number]     Admit date:  04/10/2014  Clinical Social Worker:  Lovey Newcomer  Date/Time:  04/14/2014 03:45 PM  Referred by:  Physician  Date Referred:  04/14/2014 Referred for  SNF Placement   Other Referral:   Interview type:  Patient Other interview type:   Patient alert and oriented at time of assessment. Chart indicates that patient is confused "at times."    PSYCHOSOCIAL DATA Living Status:  ALONE Admitted from facility:   Level of care:   Primary support name:  Durward Fortes Primary support relationship to patient:  CHILD, ADULT Degree of support available:   Support is fair.    CURRENT CONCERNS Current Concerns  Post-Acute Placement   Other Concerns:   NA    SOCIAL WORK ASSESSMENT / PLAN CSW was notified by RN that PT has evaluated patient this afternoon and has recommended SNF for patient. Per chart previous plan was for return home. CSW met with the patient at bedside to complete assessment. Patient was standing in room and walking around bed on her own. CSW introduced self and explained reason for visit. Patient states that she is willing to go to short term rehab and does not have a preference for facility. CSW explained SNF search/placement process and answered patient's questions. Patient did exhibit odd behaviors, she appeared restless and preoccupied. CSW explained that CSW will followup with bed offers once available.    CSW notes that patient is a 89f min guard with ambulation. Due to patient's psychiatric and substance abuse history, patient will likely require a level II PASRR. This could delay discharge, unless patient can be discharged home with services. Please note that placement can be pursued from home, by placing a social worker in the home.   Assessment/plan status:  Psychosocial Support/Ongoing  Assessment of Needs Other assessment/ plan:   Complete Fl2, Fax, PASRR   Information/referral to community resources:   CSW contact information and SNF list given.    PATIENT'S/FAMILY'S RESPONSE TO PLAN OF CARE: Patient is agreeable to SNF placement. CSW will assist.       BLiz BeachMSW, [place] [phone]

## 2014-04-14 NOTE — Clinical Social Work Placement (Signed)
Clinical Social Work Department CLINICAL SOCIAL WORK PLACEMENT NOTE 04/14/2014  Patient:  Dorothy Burns  Account Number:  [id number] Admit date:  04/09/2014  Clinical Social Worker:  Kemper Durie, [place]  Date/time:  04/14/2014 08:23 PM  Clinical Social Work is seeking post-discharge placement for this patient at the following level of care:   SKILLED NURSING   (*CSW will update this form in Epic as items are completed)   04/14/2014  Patient/family provided with [place] Department of Clinical Social Work's list of facilities offering this level of care within the geographic area requested by the patient (or if unable, by the patient's family).  04/14/2014  Patient/family informed of their freedom to choose among providers that offer the needed level of care, that participate in Medicare, Medicaid or managed care program needed by the patient, have an available bed and are willing to accept the patient.  04/14/2014  Patient/family informed of [facility]' ownership interest in [facility], as well as of the fact that they are under no obligation to receive care at this facility.  PASARR submitted to EDS on 04/14/2014 PASARR number received on   FL2 transmitted to all facilities in geographic area requested by pt/family on  04/14/2014 FL2 transmitted to all facilities within larger geographic area on   Patient informed that his/her managed care company has contracts with or will negotiate with  certain facilities, including the following:     Patient/family informed of bed offers received:   Patient chooses bed at  Physician recommends and patient chooses bed at    Patient to be transferred to  on   Patient to be transferred to facility by  Patient and family notified of transfer on  Name of family member notified:    The following physician request were entered in Epic:   Additional Comments:   Liz Beach MSW, [place], [place], [phone]

## 2014-04-14 NOTE — Progress Notes (Signed)
[facility] PROGRESS NOTE  YARENI CREPS KDT:[id number] DOB: 09-15-1955 DOA: 04/10/2014 PCP: Barbette Merino, MD  Assessment/Plan: 58 y.o. female with PMH of Liver Cirrhosis HCC, Hepatitis C, Polysubstance use,  recent bland embolization of the left lateral hepatic artery for treatment of large left HCC tumor who presented to RUQ pain after the procedure. CT done in ED showed New 5 cm fluid and gas collection associated with recently treated left lobe mass; Initially per ER, and admitting physician's discussion IR planned to perform aspiration/drainage of this collection; But on further discussion with IR: Dr. Pascal Lux, who performed embo procedure, and he feels this represents a normal response to treatment as a sterile necrosis - Pt also reports having diarrhea >7-10 times a day without vomiting, no hematochezia, or hematemesis; denies fever, chills, no chest pain, no SOB  RUQ pain; CT: New 5 cm fluid and gas collection associated with recently treated left lobe mass; -per IR: Dr. Pascal Lux, who performed embo procedure, and he feels this represents a normal response to treatment as a sterile necrosis -pain has improved- takes Oxycodone at home for pain- has been resumed here.    Diarrhea of unclear etiology; +recent atx exposure;  -resolving- c diff negative- pathogen panel negative  Low grade fevers - likely from recent embolization- no other source has been found   Liver Cirrhosis HCC, Hepatitis C; f/u with Dr. Alvy Bimler -not a surgical candidate; s/p bland arterial embolization of the dominant hypervascular mass within the peripheral aspect of the left lobe of the liver -cont home regimen; outpatient oncology follow up    Thrombocytopenia due to liver cirrhosis; no s/s of acute bleeding; cont monitor    Code Status: full Family Communication:  D/w patient and  father Disposition Plan: SNF    Consultants:  IR  Procedures:  none  Antibiotics:  none (indicate start date, and stop  date if known)  HPI/Subjective: Having som RUQ pain again today  Objective: Filed Vitals:   04/14/14 1358  BP: 118/68  Pulse: 63  Temp: 98.4 F (36.9 C)  Resp: 16    Intake/Output Summary (Last 24 hours) at 04/14/14 1615 Last data filed at 04/14/14 1020  Gross per 24 hour  Intake   1083 ml  Output      0 ml  Net   1083 ml   Filed Weights   04/10/14 1655 04/11/14 0227  Weight: 62.596 kg (138 lb) 58.196 kg (128 lb 4.8 oz)    Exam:   General:  alert  Cardiovascular: s1,s2 rrr  Respiratory: CTA BL  Abdomen: soft, mild tender R UQ; no rebound   Musculoskeletal: no LE edeam   Data Reviewed: Basic Metabolic Panel:  Recent Labs Lab 04/10/14 1637 04/11/14 0805  NA 141 142  K 3.1* 3.7  CL 101 104  CO2 29 31  GLUCOSE 86 67*  BUN 13 13  CREATININE 1.05 1.03  CALCIUM 9.1 9.1   Liver Function Tests:  Recent Labs Lab 04/10/14 1637  AST 81*  ALT 26  ALKPHOS 185*  BILITOT 0.8  PROT 7.5  ALBUMIN 2.8*    Recent Labs Lab 04/10/14 1637  LIPASE 19    Recent Labs Lab 04/14/14 1210  AMMONIA 39   CBC:  Recent Labs Lab 04/10/14 1637 04/11/14 0805  WBC 3.0* 2.6*  NEUTROABS 1.6*  --   HGB 12.0 11.2*  HCT 35.1* 33.3*  MCV 94.6 92.8  PLT 104* 82*   Cardiac Enzymes: No results found for this basename: CKTOTAL, CKMB, CKMBINDEX, TROPONINI,  in the last 168 hours BNP (last 3 results) No results found for this basename: PROBNP,  in the last 8760 hours CBG: No results found for this basename: GLUCAP,  in the last 168 hours  Recent Results (from the past 240 hour(s))  CLOSTRIDIUM DIFFICILE BY PCR     Status: None   Collection Time    04/11/14  3:07 PM      Result Value Ref Range Status   C difficile by pcr NEGATIVE  NEGATIVE Final     Studies: No results found.  Scheduled Meds: . colchicine  0.6 mg Oral Daily  . divalproex  250 mg Oral Q12H  . furosemide  20 mg Oral Daily  . gabapentin  300 mg Oral TID  . nicotine  7 mg Transdermal Q24H   . pantoprazole  40 mg Oral Daily  . sodium chloride  3 mL Intravenous Q12H   Continuous Infusions:   Principal Problem:   Abdominal pain, unspecified site Active Problems:   Substance abuse   Anemia of other chronic disease   Liver cirrhosis   Hepatocellular carcinoma   Protein-calorie malnutrition, severe   RUQ pain    Time spent: >35 minutes     [place]  [facility]  If 7PM-7AM, please contact night-coverage at [email or web address], password [facility] 04/14/2014, 4:15 PM  LOS: 4 days
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
## 2014-04-14 NOTE — Progress Notes (Signed)
Pt. C/o of left sided chest pain & lower back pain this am.  Chest pain is 7 on scale of 0-10 & back pain is 8 on scale of 0-10.  VSS - Blood pressure 111/75, pulse 66, temperature 100.2 F (37.9 C), temperature source Oral, resp. rate 18, height 5\' 3"  (1.6 m), weight 58.196 kg (128 lb 4.8 oz), last menstrual period 05/21/2012, SpO2 96.00%., R/A.  Dr. Wynelle Cleveland came in room to see pt. And informed of above.  Orders received for pain medications, but informed  to monitor some more before given medication.  Will continue to monitor.  Alphonzo Lemmings, RN

## 2014-04-14 NOTE — Clinical Social Work Psychosocial (Deleted)
Clinical Social Work Department BRIEF PSYCHOSOCIAL ASSESSMENT 04/14/2014  Patient:  Dorothy Burns,Dorothy Burns     Account Number:  [id number]     Admit date:  04/09/2014  Clinical Social Worker:  Hashim Eichhorst BRYANT, LCSWA  Date/Time:  04/14/2014 03:00 PM  Referred by:  Physician  Date Referred:  04/14/2014 Referred for  SNF Placement   Other Referral:   Interview type:  Patient Other interview type:   Patient and husband Ray interviewed at bedside to complete assessment.    PSYCHOSOCIAL DATA Living Status:  HUSBAND Admitted from facility:   Level of care:   Primary support name:  Ray Primary support relationship to patient:  SPOUSE Degree of support available:   Support is strong. Very supportive husband.    CURRENT CONCERNS Current Concerns  Post-Acute Placement   Other Concerns:   NA    SOCIAL WORK ASSESSMENT / PLAN CSW met with patient and husband at bedside to complete assessment. CSW explained that PT has recommended SNF for patient and that if psychiatry clears patient then patient would be able to DC to SNF when medically stable. Patient is agreeable to go to SNF but would prefer to go home. Husband Ray is very supportive and states that he just wants what is best for the patient. Per Ray, the patient is pretty much independent at home PTA. CSW explained SNF search/placement process answered questions. Patient and husband appeared calm and engaged in assessment.   Assessment/plan status:  Psychosocial Support/Ongoing Assessment of Needs Other assessment/ plan:   Complete Fl2, Fax, PASRR   Information/referral to community resources:   CSW contact information and SNF list given.    PATIENT'S/FAMILY'S RESPONSE TO PLAN OF CARE: Patient is agreeable to SNF placement if cleared by psychiatric which is currently recommending inpatient geri psych.       Bryant Denene Alamillo MSW, LCSWA, LCASA, [phone] 

## 2014-04-15 MED ORDER — TRAMADOL HCL 50 MG PO TABS
50.0000 mg | ORAL_TABLET | Freq: Three times a day (TID) | ORAL | Status: DC | PRN
  Administered 2014-04-15: 50 mg via ORAL
  Filled 2014-04-15: qty 1

## 2014-04-15 NOTE — Clinical Social Work Note (Signed)
CSW continues to follow for d/c planning. CSW reviewed patients' information and PASARR is still in review, 30 day note requested. CSW obtained 30 day note and made MD (Rizwan) aware. CSW placed 30 day note in patient's shadow chart.   CSW met with patient and family present. CSW asked patient if she would allow family members present to remain in room during discussion. Per patient, family can stay in room during discussion. CSW made patient aware of pending PASARR and facilities contacted for placement. Patient verbalized understanding. CSW made patient's RN Joellen Jersey) aware of the above. CSW to follow tomorrow.   [place], [place] Weekend Clinical Social Worker [phone]

## 2014-04-15 NOTE — Progress Notes (Signed)
Patient awaiting PASSAR for SNF placement.

## 2014-04-15 NOTE — Clinical Social Work Note (Addendum)
CSW continues to follow for d/c planning needs. CSW received contact from the following facilities regarding bed availability:   [facility] ([place]): no, unable to meet needs [facility] ([place])-considering (pending RN evaluation)  [facility] ([place])-considering  [facility]-no [facility], unable to meet needs [facility]-considering (verifying insurance)  CSW made patient aware. CSW to follow with facilities who are considering placement tomorrow.  [place], [place] Weekend Clinical Social Worker [phone]

## 2014-04-15 NOTE — Discharge Summary (Addendum)
Physician Discharge Summary  Dorothy Burns LSL:[id number] DOB: Jul 06, 1956 DOA: 04/10/2014  PCP: Barbette Merino, MD  Admit date: 04/10/2014 Discharge date: 04/15/2014  Time spent: >45 minutes   Discharge Condition: stable Diet recommendation: regular diet  Discharge Diagnoses:  Principal Problem:   Abdominal pain RUQ Active Problems:   Liver cirrhosis   Hepatocellular carcinoma   Substance abuse   Anemia of other chronic disease   Protein-calorie malnutrition, severe  chronic pain     History of present illness:  58 y.o. female with PMH of Liver Cirrhosis HCC, Hepatitis C, Polysubstance use, recent bland embolization of the left lateral hepatic artery for treatment of large left HCC tumor who presented to RUQ pain after the procedure. CT done in ED showed New 5 cm fluid and gas collection associated with recently treated left lobe mass; Initially per ER, and admitting physician's discussion IR planned to perform aspiration/drainage of this collection; But on further discussion with IR: Dr. Pascal Lux, who performed the procedure, he feels this represents a normal response to treatment as a sterile necrosis   Hospital Course:  RUQ pain - New 5 cm fluid and gas collection associated with recently treated left lobe mass;  -per IR: Dr. Pascal Lux, who performed embo procedure, this represents a normal response to treatment as a sterile necrosis  -pain has improved- takes Oxycodone at home for pain- has been resumed here and is working well for her.   Diarrhea of unclear etiology -resolved- c diff and pathogen panel both negative   Low grade fevers  - likely from recent embolization and resultant necrosis - work up for infection has been completed - no other source has been found   Liver Cirrhosis HCC, Hepatitis C - f/u with Dr. Alvy Bimler  -not a surgical candidate; s/p bland arterial embolization of the dominant hypervascular mass within the peripheral aspect of the left lobe of the liver   -cont home regimen; outpatient oncology follow up   Thrombocytopenia  - due to liver cirrhosis; no s/s of acute bleeding; cont monitor   Procedures:  none  Consultations:  IR  Discharge Exam: Filed Weights   04/10/14 1655 04/11/14 0227  Weight: 62.596 kg (138 lb) 58.196 kg (128 lb 4.8 oz)   Filed Vitals:   04/15/14 0605  BP: 100/64  Pulse: 56  Temp: 98.2 F (36.8 C)  Resp: 16    General: AAO x 3, no distress Cardiovascular: RRR, no murmurs  Respiratory: clear to auscultation bilaterally GI: soft, non-tender, non-distended, bowel sound positive  Discharge Instructions You were cared for by a hospitalist during your hospital stay. If you have any questions about your discharge medications or the care you received while you were in the hospital after you are discharged, you can call the unit and asked to speak with the hospitalist on call if the hospitalist that took care of you is not available. Once you are discharged, your primary care physician will handle any further medical issues. Please note that NO REFILLS for any discharge medications will be authorized once you are discharged, as it is imperative that you return to your primary care physician (or establish a relationship with a primary care physician if you do not have one) for your aftercare needs so that they can reassess your need for medications and monitor your lab values.     Medication List    STOP taking these medications       traMADol 50 MG tablet  Commonly known as:  Veatrice Bourbon  zolpidem 10 MG tablet  Commonly known as:  AMBIEN      TAKE these medications       B-1 PO  Take 1 capsule by mouth daily.     B-12 PO  Take 1 capsule by mouth daily.     budesonide-formoterol 160-4.5 MCG/ACT inhaler  Commonly known as:  SYMBICORT  Inhale 2 puffs into the lungs 2 (two) times daily.     clonazePAM 2 MG tablet  Commonly known as:  KLONOPIN  Take 2 mg by mouth 2 (two) times daily.     colchicine  0.6 MG tablet  Take 0.6 mg by mouth daily.     divalproex 250 MG DR tablet  Commonly known as:  DEPAKOTE  Take 250 mg by mouth 2 (two) times daily.     furosemide 20 MG tablet  Commonly known as:  LASIX  Take 20 mg by mouth every morning.     gabapentin 300 MG capsule  Commonly known as:  NEURONTIN  Take 300 mg by mouth 3 (three) times daily.     hydrochlorothiazide 25 MG tablet  Commonly known as:  HYDRODIURIL  Take 25 mg by mouth every evening.     multivitamin with minerals Tabs tablet  Take 1 tablet by mouth daily.     omeprazole 20 MG capsule  Commonly known as:  PRILOSEC  Take 20 mg by mouth daily.     oxycodone 5 MG capsule  Commonly known as:  OXY-IR  Take 5 mg by mouth every 4 (four) hours as needed for pain.       Allergies  Allergen Reactions  . Tylenol [Acetaminophen]     Liver issues      The results of significant diagnostics from this hospitalization (including imaging, microbiology, ancillary and laboratory) are listed below for reference.    Significant Diagnostic Studies: Ct Abdomen Pelvis W Contrast  04/11/2014   CLINICAL DATA:  Abdominal pain in multiple areas, including right upper quadrant.  EXAM: CT ABDOMEN AND PELVIS WITH CONTRAST  TECHNIQUE: Multidetector CT imaging of the abdomen and pelvis was performed using the standard protocol following bolus administration of intravenous contrast.  CONTRAST:  140m OMNIPAQUE IOHEXOL 300 MG/ML  SOLN  COMPARISON:  12/22/2013  FINDINGS: BODY WALL: Unremarkable.  LOWER CHEST: Unremarkable.  ABDOMEN/PELVIS:  Liver: Liver cirrhosis with small lobulated liver, fissure enlargement, and recannulated periumbilical vein. Two enhancing masses consistent with hepatocellular carcinoma. A 4.8 cm mass in segment eight appears larger than on December 23, 2013 abdominal CT, but stable in size compared to 12/15/2013 MRI. Differences on CT may be related to imaging phase. The recently treated left hepatic hepatic cellular carcinoma  has a shrunken enhancing portion but new medial in inferiorly directed cavitary portion which measures 5.3 cm. The collection contains a few bubbles of anti dependent gas. There is a mildly enhancing capsule around this fluid component. The fluid collection has mass effect on the upper stomach, without definitive fistulization based on continuity of the gastric mucosa. Stable adenopathy in the deep liver drainage, occluding the porta hepatis and portacaval stations. Lower periesophageal varices.  Biliary: Cholecystectomy. No intrahepatic biliary ductal dilatation suggestive of biliary necrosis.  Pancreas: Unremarkable.  Spleen: Splenomegaly in the setting of portal hypertension.  Adrenals: Unremarkable.  Kidneys and ureters: No hydronephrosis or stone.  Bladder: Unremarkable.  Reproductive: Stable appearance of the uterus and ovaries.  Bowel: No obstruction.  Retroperitoneum: No mass or adenopathy.  Peritoneum: No ascites or pneumoperitoneum.  Vascular: No acute abnormality.  OSSEOUS: No acute abnormalities.  IMPRESSION: 1. Cirrhosis with 2 hepatocellular carcinomas. New 5 cm fluid and gas collection associated with recently treated left lobe mass which could represent abscess, sterile necrosis, or biloma. 2. Stable adenopathy in the deep liver drainage. 3. Portal hypertension with lower esophageal varices.   Electronically Signed   By: Jorje Guild M.D.   On: 04/11/2014 00:00   Ir Angiogram Visceral Selective  03/23/2014   INDICATION: History of hepatitis, polysubstance abuse, alcoholism and cirrhosis and known multifocal hepatic cellular carcinoma. Patient presents today for bland embolization of the dominant lesion within the left lobe of the liver.  EXAM: 1. ULTRASOUND GUIDANCE FOR ARTERIAL ACCESS. 2. CELIAC AND SUPERIOR MESENTERIC ARTERIOGRAMS (1st ORDER) 3. LEFT LATERAL SEGMENTAL HEPATIC ARTERIOGRAM (3rd ORDER) 4. FLUOROSCOPIC GUIDED BLAND EMBOLIZATION OF THE LEFT LATERAL SEGMENTAL HEPATIC ARTERY.   COMPARISON:  CTA of the chest, abdomen and pelvis - 12/22/2013  MEDICATIONS: Versed 1 mg IV; Fentanyl 75 mcg IV; Vancomycin 1 g IV  Sedation time: 45 minutes  CONTRAST:  75 cc Omnipaque 300  FLUOROSCOPY TIME:  10 minutes.  30 seconds.  COMPLICATIONS: None immediate  TECHNIQUE: Informed written consent was obtained from the patient after a discussion of the risks, benefits and alternatives to treatment. Questions regarding the procedure were encouraged and answered. A timeout was performed prior to the initiation of the procedure.  The right groin was prepped and draped in the usual sterile fashion, and a sterile drape was applied covering the operative field. Maximum barrier sterile technique with sterile gowns and gloves were used for the procedure. A timeout was performed prior to the initiation of the procedure. Local anesthesia was provided with 1% lidocaine.  The right femoral head was marked fluoroscopically. Under ultrasound guidance, the right common femoral artery was accessed with a micropuncture kit after the overlying soft tissues were anesthetized with 1% lidocaine. An ultrasound image was saved for documentation purposes. The micropuncture sheath was exchanged for a 5 [place] vascular sheath over a Bentson wire. A closure arteriogram was performed through the side of the sheath confirming access within the right common femoral artery.  Over a Bentson wire, a Mickelson catheter was advanced to the level of the thoracic aorta where it was back bled and flushed. The catheter was then utilized to select the superior mesenteric arch artery and a superior mesenteric arteriogram with delayed portal venous phase imaging was acquired.  The Mickelson within advance cranially and utilized to select the celiac artery and a celiac arteriogram was performed.  With the use of a Fathom micro wire, a high-flow Renegade micro catheter was utilized to select the left lateral segmental hepatic artery and a left lateral  segmental hepatic arteriogram was performed. Appropriate embolization positioning was confirmed within the peripheral aspect of the vessel and the dominant hypervascular mass within the peripheral aspect of the lateral segment of the left lobe of the liver was embolized with approximately 1 vial of 100-300 micron Embospheres.  The micro catheter was slightly withdrawn into the more proximal aspect of the left lateral hepatic artery and post embolization arteriogram was performed.  Images were reviewed and the procedure was terminated. All wires catheters and sheaths were removed from the patient. Hemostasis was achieved at the right groin access site with manual compression. A dressing was placed. The patient tolerated the procedure well without immediate postprocedural complication.  FINDINGS: Initial superior mesenteric arteriogram failed to delineate definitive replaced or accessory supply to the hepatic parenchyma. Delayed portal venous phase imaging  confirmed patency of the main, right and left portal veins.  Celiac arteriogram demonstrates separate origins of the left medial and lateral segmental hepatic arteries. Otherwise, conventional branch branching pattern of the celiac trunk is demonstrated  The left lateral segmental hepatic artery was selected with sub selective angiogram demonstrating perfusion into the dominant mass within the peripheral aspect of the lateral segment of the left lobe of the liver as was demonstrated on preceding CTA. Appropriate embolization positioning was confirmed within the peripheral aspect of this vessel and bland embolization was performed with 100 to 300 Embospheres.  Post embolization left lateral segmental hepatic arteriogram demonstrates near complete stasis of the vessel with no residual perfusion of the known dominant hypervascular mass.  IMPRESSION: Technically successful bland embolization of the dominant hypervascular mass within the peripheral aspect of the left  lobe of the liver.  PLAN: The patient will return to interventional radiology clinic in approximately 1 month. A CMP will be obtained at that time and the patient will be evaluated to ensure complete recovery from this initial procedure prior to proceeding with bland embolization of the additional known lesion within the right lobe of the liver.   Electronically Signed   By: Sandi Mariscal M.D.   On: 03/23/2014 17:23   Ir Angiogram Visceral Selective  03/23/2014   INDICATION: History of hepatitis, polysubstance abuse, alcoholism and cirrhosis and known multifocal hepatic cellular carcinoma. Patient presents today for bland embolization of the dominant lesion within the left lobe of the liver.  EXAM: 1. ULTRASOUND GUIDANCE FOR ARTERIAL ACCESS. 2. CELIAC AND SUPERIOR MESENTERIC ARTERIOGRAMS (1st ORDER) 3. LEFT LATERAL SEGMENTAL HEPATIC ARTERIOGRAM (3rd ORDER) 4. FLUOROSCOPIC GUIDED BLAND EMBOLIZATION OF THE LEFT LATERAL SEGMENTAL HEPATIC ARTERY.  COMPARISON:  CTA of the chest, abdomen and pelvis - 12/22/2013  MEDICATIONS: Versed 1 mg IV; Fentanyl 75 mcg IV; Vancomycin 1 g IV  Sedation time: 45 minutes  CONTRAST:  75 cc Omnipaque 300  FLUOROSCOPY TIME:  10 minutes.  30 seconds.  COMPLICATIONS: None immediate  TECHNIQUE: Informed written consent was obtained from the patient after a discussion of the risks, benefits and alternatives to treatment. Questions regarding the procedure were encouraged and answered. A timeout was performed prior to the initiation of the procedure.  The right groin was prepped and draped in the usual sterile fashion, and a sterile drape was applied covering the operative field. Maximum barrier sterile technique with sterile gowns and gloves were used for the procedure. A timeout was performed prior to the initiation of the procedure. Local anesthesia was provided with 1% lidocaine.  The right femoral head was marked fluoroscopically. Under ultrasound guidance, the right common femoral artery was  accessed with a micropuncture kit after the overlying soft tissues were anesthetized with 1% lidocaine. An ultrasound image was saved for documentation purposes. The micropuncture sheath was exchanged for a 5 [place] vascular sheath over a Bentson wire. A closure arteriogram was performed through the side of the sheath confirming access within the right common femoral artery.  Over a Bentson wire, a Mickelson catheter was advanced to the level of the thoracic aorta where it was back bled and flushed. The catheter was then utilized to select the superior mesenteric arch artery and a superior mesenteric arteriogram with delayed portal venous phase imaging was acquired.  The Mickelson within advance cranially and utilized to select the celiac artery and a celiac arteriogram was performed.  With the use of a Fathom micro wire, a high-flow Renegade micro catheter was utilized to  select the left lateral segmental hepatic artery and a left lateral segmental hepatic arteriogram was performed. Appropriate embolization positioning was confirmed within the peripheral aspect of the vessel and the dominant hypervascular mass within the peripheral aspect of the lateral segment of the left lobe of the liver was embolized with approximately 1 vial of 100-300 micron Embospheres.  The micro catheter was slightly withdrawn into the more proximal aspect of the left lateral hepatic artery and post embolization arteriogram was performed.  Images were reviewed and the procedure was terminated. All wires catheters and sheaths were removed from the patient. Hemostasis was achieved at the right groin access site with manual compression. A dressing was placed. The patient tolerated the procedure well without immediate postprocedural complication.  FINDINGS: Initial superior mesenteric arteriogram failed to delineate definitive replaced or accessory supply to the hepatic parenchyma. Delayed portal venous phase imaging confirmed patency of the  main, right and left portal veins.  Celiac arteriogram demonstrates separate origins of the left medial and lateral segmental hepatic arteries. Otherwise, conventional branch branching pattern of the celiac trunk is demonstrated  The left lateral segmental hepatic artery was selected with sub selective angiogram demonstrating perfusion into the dominant mass within the peripheral aspect of the lateral segment of the left lobe of the liver as was demonstrated on preceding CTA. Appropriate embolization positioning was confirmed within the peripheral aspect of this vessel and bland embolization was performed with 100 to 300 Embospheres.  Post embolization left lateral segmental hepatic arteriogram demonstrates near complete stasis of the vessel with no residual perfusion of the known dominant hypervascular mass.  IMPRESSION: Technically successful bland embolization of the dominant hypervascular mass within the peripheral aspect of the left lobe of the liver.  PLAN: The patient will return to interventional radiology clinic in approximately 1 month. A CMP will be obtained at that time and the patient will be evaluated to ensure complete recovery from this initial procedure prior to proceeding with bland embolization of the additional known lesion within the right lobe of the liver.   Electronically Signed   By: Sandi Mariscal M.D.   On: 03/23/2014 17:23   Ir Angiogram Selective Each Additional Vessel  03/23/2014   INDICATION: History of hepatitis, polysubstance abuse, alcoholism and cirrhosis and known multifocal hepatic cellular carcinoma. Patient presents today for bland embolization of the dominant lesion within the left lobe of the liver.  EXAM: 1. ULTRASOUND GUIDANCE FOR ARTERIAL ACCESS. 2. CELIAC AND SUPERIOR MESENTERIC ARTERIOGRAMS (1st ORDER) 3. LEFT LATERAL SEGMENTAL HEPATIC ARTERIOGRAM (3rd ORDER) 4. FLUOROSCOPIC GUIDED BLAND EMBOLIZATION OF THE LEFT LATERAL SEGMENTAL HEPATIC ARTERY.  COMPARISON:  CTA of the  chest, abdomen and pelvis - 12/22/2013  MEDICATIONS: Versed 1 mg IV; Fentanyl 75 mcg IV; Vancomycin 1 g IV  Sedation time: 45 minutes  CONTRAST:  75 cc Omnipaque 300  FLUOROSCOPY TIME:  10 minutes.  30 seconds.  COMPLICATIONS: None immediate  TECHNIQUE: Informed written consent was obtained from the patient after a discussion of the risks, benefits and alternatives to treatment. Questions regarding the procedure were encouraged and answered. A timeout was performed prior to the initiation of the procedure.  The right groin was prepped and draped in the usual sterile fashion, and a sterile drape was applied covering the operative field. Maximum barrier sterile technique with sterile gowns and gloves were used for the procedure. A timeout was performed prior to the initiation of the procedure. Local anesthesia was provided with 1% lidocaine.  The right femoral head was marked  fluoroscopically. Under ultrasound guidance, the right common femoral artery was accessed with a micropuncture kit after the overlying soft tissues were anesthetized with 1% lidocaine. An ultrasound image was saved for documentation purposes. The micropuncture sheath was exchanged for a 5 [place] vascular sheath over a Bentson wire. A closure arteriogram was performed through the side of the sheath confirming access within the right common femoral artery.  Over a Bentson wire, a Mickelson catheter was advanced to the level of the thoracic aorta where it was back bled and flushed. The catheter was then utilized to select the superior mesenteric arch artery and a superior mesenteric arteriogram with delayed portal venous phase imaging was acquired.  The Mickelson within advance cranially and utilized to select the celiac artery and a celiac arteriogram was performed.  With the use of a Fathom micro wire, a high-flow Renegade micro catheter was utilized to select the left lateral segmental hepatic artery and a left lateral segmental hepatic  arteriogram was performed. Appropriate embolization positioning was confirmed within the peripheral aspect of the vessel and the dominant hypervascular mass within the peripheral aspect of the lateral segment of the left lobe of the liver was embolized with approximately 1 vial of 100-300 micron Embospheres.  The micro catheter was slightly withdrawn into the more proximal aspect of the left lateral hepatic artery and post embolization arteriogram was performed.  Images were reviewed and the procedure was terminated. All wires catheters and sheaths were removed from the patient. Hemostasis was achieved at the right groin access site with manual compression. A dressing was placed. The patient tolerated the procedure well without immediate postprocedural complication.  FINDINGS: Initial superior mesenteric arteriogram failed to delineate definitive replaced or accessory supply to the hepatic parenchyma. Delayed portal venous phase imaging confirmed patency of the main, right and left portal veins.  Celiac arteriogram demonstrates separate origins of the left medial and lateral segmental hepatic arteries. Otherwise, conventional branch branching pattern of the celiac trunk is demonstrated  The left lateral segmental hepatic artery was selected with sub selective angiogram demonstrating perfusion into the dominant mass within the peripheral aspect of the lateral segment of the left lobe of the liver as was demonstrated on preceding CTA. Appropriate embolization positioning was confirmed within the peripheral aspect of this vessel and bland embolization was performed with 100 to 300 Embospheres.  Post embolization left lateral segmental hepatic arteriogram demonstrates near complete stasis of the vessel with no residual perfusion of the known dominant hypervascular mass.  IMPRESSION: Technically successful bland embolization of the dominant hypervascular mass within the peripheral aspect of the left lobe of the liver.   PLAN: The patient will return to interventional radiology clinic in approximately 1 month. A CMP will be obtained at that time and the patient will be evaluated to ensure complete recovery from this initial procedure prior to proceeding with bland embolization of the additional known lesion within the right lobe of the liver.   Electronically Signed   By: Sandi Mariscal M.D.   On: 03/23/2014 17:23   Ir US Guide Vasc Access Right  03/23/2014   INDICATION: History of hepatitis, polysubstance abuse, alcoholism and cirrhosis and known multifocal hepatic cellular carcinoma. Patient presents today for bland embolization of the dominant lesion within the left lobe of the liver.  EXAM: 1. ULTRASOUND GUIDANCE FOR ARTERIAL ACCESS. 2. CELIAC AND SUPERIOR MESENTERIC ARTERIOGRAMS (1st ORDER) 3. LEFT LATERAL SEGMENTAL HEPATIC ARTERIOGRAM (3rd ORDER) 4. FLUOROSCOPIC GUIDED BLAND EMBOLIZATION OF THE LEFT LATERAL SEGMENTAL HEPATIC ARTERY.  COMPARISON:  CTA of the chest, abdomen and pelvis - 12/22/2013  MEDICATIONS: Versed 1 mg IV; Fentanyl 75 mcg IV; Vancomycin 1 g IV  Sedation time: 45 minutes  CONTRAST:  75 cc Omnipaque 300  FLUOROSCOPY TIME:  10 minutes.  30 seconds.  COMPLICATIONS: None immediate  TECHNIQUE: Informed written consent was obtained from the patient after a discussion of the risks, benefits and alternatives to treatment. Questions regarding the procedure were encouraged and answered. A timeout was performed prior to the initiation of the procedure.  The right groin was prepped and draped in the usual sterile fashion, and a sterile drape was applied covering the operative field. Maximum barrier sterile technique with sterile gowns and gloves were used for the procedure. A timeout was performed prior to the initiation of the procedure. Local anesthesia was provided with 1% lidocaine.  The right femoral head was marked fluoroscopically. Under ultrasound guidance, the right common femoral artery was accessed with a  micropuncture kit after the overlying soft tissues were anesthetized with 1% lidocaine. An ultrasound image was saved for documentation purposes. The micropuncture sheath was exchanged for a 5 [place] vascular sheath over a Bentson wire. A closure arteriogram was performed through the side of the sheath confirming access within the right common femoral artery.  Over a Bentson wire, a Mickelson catheter was advanced to the level of the thoracic aorta where it was back bled and flushed. The catheter was then utilized to select the superior mesenteric arch artery and a superior mesenteric arteriogram with delayed portal venous phase imaging was acquired.  The Mickelson within advance cranially and utilized to select the celiac artery and a celiac arteriogram was performed.  With the use of a Fathom micro wire, a high-flow Renegade micro catheter was utilized to select the left lateral segmental hepatic artery and a left lateral segmental hepatic arteriogram was performed. Appropriate embolization positioning was confirmed within the peripheral aspect of the vessel and the dominant hypervascular mass within the peripheral aspect of the lateral segment of the left lobe of the liver was embolized with approximately 1 vial of 100-300 micron Embospheres.  The micro catheter was slightly withdrawn into the more proximal aspect of the left lateral hepatic artery and post embolization arteriogram was performed.  Images were reviewed and the procedure was terminated. All wires catheters and sheaths were removed from the patient. Hemostasis was achieved at the right groin access site with manual compression. A dressing was placed. The patient tolerated the procedure well without immediate postprocedural complication.  FINDINGS: Initial superior mesenteric arteriogram failed to delineate definitive replaced or accessory supply to the hepatic parenchyma. Delayed portal venous phase imaging confirmed patency of the main, right and  left portal veins.  Celiac arteriogram demonstrates separate origins of the left medial and lateral segmental hepatic arteries. Otherwise, conventional branch branching pattern of the celiac trunk is demonstrated  The left lateral segmental hepatic artery was selected with sub selective angiogram demonstrating perfusion into the dominant mass within the peripheral aspect of the lateral segment of the left lobe of the liver as was demonstrated on preceding CTA. Appropriate embolization positioning was confirmed within the peripheral aspect of this vessel and bland embolization was performed with 100 to 300 Embospheres.  Post embolization left lateral segmental hepatic arteriogram demonstrates near complete stasis of the vessel with no residual perfusion of the known dominant hypervascular mass.  IMPRESSION: Technically successful bland embolization of the dominant hypervascular mass within the peripheral aspect of the left lobe of the  liver.  PLAN: The patient will return to interventional radiology clinic in approximately 1 month. A CMP will be obtained at that time and the patient will be evaluated to ensure complete recovery from this initial procedure prior to proceeding with bland embolization of the additional known lesion within the right lobe of the liver.   Electronically Signed   By: Sandi Mariscal M.D.   On: 03/23/2014 17:23   Ir Embo Tumor Organ Ischemia Infarct Inc Guide Roadmapping  03/23/2014   INDICATION: History of hepatitis, polysubstance abuse, alcoholism and cirrhosis and known multifocal hepatic cellular carcinoma. Patient presents today for bland embolization of the dominant lesion within the left lobe of the liver.  EXAM: 1. ULTRASOUND GUIDANCE FOR ARTERIAL ACCESS. 2. CELIAC AND SUPERIOR MESENTERIC ARTERIOGRAMS (1st ORDER) 3. LEFT LATERAL SEGMENTAL HEPATIC ARTERIOGRAM (3rd ORDER) 4. FLUOROSCOPIC GUIDED BLAND EMBOLIZATION OF THE LEFT LATERAL SEGMENTAL HEPATIC ARTERY.  COMPARISON:  CTA of the  chest, abdomen and pelvis - 12/22/2013  MEDICATIONS: Versed 1 mg IV; Fentanyl 75 mcg IV; Vancomycin 1 g IV  Sedation time: 45 minutes  CONTRAST:  75 cc Omnipaque 300  FLUOROSCOPY TIME:  10 minutes.  30 seconds.  COMPLICATIONS: None immediate  TECHNIQUE: Informed written consent was obtained from the patient after a discussion of the risks, benefits and alternatives to treatment. Questions regarding the procedure were encouraged and answered. A timeout was performed prior to the initiation of the procedure.  The right groin was prepped and draped in the usual sterile fashion, and a sterile drape was applied covering the operative field. Maximum barrier sterile technique with sterile gowns and gloves were used for the procedure. A timeout was performed prior to the initiation of the procedure. Local anesthesia was provided with 1% lidocaine.  The right femoral head was marked fluoroscopically. Under ultrasound guidance, the right common femoral artery was accessed with a micropuncture kit after the overlying soft tissues were anesthetized with 1% lidocaine. An ultrasound image was saved for documentation purposes. The micropuncture sheath was exchanged for a 5 [place] vascular sheath over a Bentson wire. A closure arteriogram was performed through the side of the sheath confirming access within the right common femoral artery.  Over a Bentson wire, a Mickelson catheter was advanced to the level of the thoracic aorta where it was back bled and flushed. The catheter was then utilized to select the superior mesenteric arch artery and a superior mesenteric arteriogram with delayed portal venous phase imaging was acquired.  The Mickelson within advance cranially and utilized to select the celiac artery and a celiac arteriogram was performed.  With the use of a Fathom micro wire, a high-flow Renegade micro catheter was utilized to select the left lateral segmental hepatic artery and a left lateral segmental hepatic  arteriogram was performed. Appropriate embolization positioning was confirmed within the peripheral aspect of the vessel and the dominant hypervascular mass within the peripheral aspect of the lateral segment of the left lobe of the liver was embolized with approximately 1 vial of 100-300 micron Embospheres.  The micro catheter was slightly withdrawn into the more proximal aspect of the left lateral hepatic artery and post embolization arteriogram was performed.  Images were reviewed and the procedure was terminated. All wires catheters and sheaths were removed from the patient. Hemostasis was achieved at the right groin access site with manual compression. A dressing was placed. The patient tolerated the procedure well without immediate postprocedural complication.  FINDINGS: Initial superior mesenteric arteriogram failed to delineate definitive replaced or  accessory supply to the hepatic parenchyma. Delayed portal venous phase imaging confirmed patency of the main, right and left portal veins.  Celiac arteriogram demonstrates separate origins of the left medial and lateral segmental hepatic arteries. Otherwise, conventional branch branching pattern of the celiac trunk is demonstrated  The left lateral segmental hepatic artery was selected with sub selective angiogram demonstrating perfusion into the dominant mass within the peripheral aspect of the lateral segment of the left lobe of the liver as was demonstrated on preceding CTA. Appropriate embolization positioning was confirmed within the peripheral aspect of this vessel and bland embolization was performed with 100 to 300 Embospheres.  Post embolization left lateral segmental hepatic arteriogram demonstrates near complete stasis of the vessel with no residual perfusion of the known dominant hypervascular mass.  IMPRESSION: Technically successful bland embolization of the dominant hypervascular mass within the peripheral aspect of the left lobe of the liver.   PLAN: The patient will return to interventional radiology clinic in approximately 1 month. A CMP will be obtained at that time and the patient will be evaluated to ensure complete recovery from this initial procedure prior to proceeding with bland embolization of the additional known lesion within the right lobe of the liver.   Electronically Signed   By: Sandi Mariscal M.D.   On: 03/23/2014 17:23    Microbiology: Recent Results (from the past 240 hour(s))  CLOSTRIDIUM DIFFICILE BY PCR     Status: None   Collection Time    04/11/14  3:07 PM      Result Value Ref Range Status   C difficile by pcr NEGATIVE  NEGATIVE Final     Labs: Basic Metabolic Panel:  Recent Labs Lab 04/10/14 1637 04/11/14 0805  NA 141 142  K 3.1* 3.7  CL 101 104  CO2 29 31  GLUCOSE 86 67*  BUN 13 13  CREATININE 1.05 1.03  CALCIUM 9.1 9.1   Liver Function Tests:  Recent Labs Lab 04/10/14 1637  AST 81*  ALT 26  ALKPHOS 185*  BILITOT 0.8  PROT 7.5  ALBUMIN 2.8*    Recent Labs Lab 04/10/14 1637  LIPASE 19    Recent Labs Lab 04/14/14 1210  AMMONIA 39   CBC:  Recent Labs Lab 04/10/14 1637 04/11/14 0805  WBC 3.0* 2.6*  NEUTROABS 1.6*  --   HGB 12.0 11.2*  HCT 35.1* 33.3*  MCV 94.6 92.8  PLT 104* 82*   Cardiac Enzymes: No results found for this basename: CKTOTAL, CKMB, CKMBINDEX, TROPONINI,  in the last 168 hours BNP: BNP (last 3 results) No results found for this basename: PROBNP,  in the last 8760 hours CBG: No results found for this basename: GLUCAP,  in the last 168 hours     Signed:  Debbe Odea, MD [facility] 04/15/2014, 10:36 AM

## 2014-04-16 DIAGNOSIS — K7460 Unspecified cirrhosis of liver: Secondary | ICD-10-CM

## 2014-04-16 NOTE — Progress Notes (Signed)
[facility] PROGRESS NOTE  Dorothy Burns VZD:[id number] DOB: 04-19-1956 DOA: 04/10/2014 PCP: Barbette Merino, MD  Assessment/Plan: 58 y.o. female with PMH of Liver Cirrhosis HCC, Hepatitis C, Polysubstance use,  recent bland embolization of the left lateral hepatic artery for treatment of large left HCC tumor who presented to RUQ pain after the procedure. CT done in ED showed New 5 cm fluid and gas collection associated with recently treated left lobe mass; Initially per ER, and admitting physician's discussion IR planned to perform aspiration/drainage of this collection; But on further discussion with IR: Dr. Pascal Lux, who performed embo procedure, and he feels this represents a normal response to treatment as a sterile necrosis - Pt also reports having diarrhea >7-10 times a day without vomiting, no hematochezia, or hematemesis; denies fever, chills, no chest pain, no SOB  HPI/Subjective: No complaints of pain- using narcotics with good control.   A/P: RUQ pain; CT: New 5 cm fluid and gas collection associated with recently treated left lobe mass; -per IR: Dr. Pascal Lux, who performed embo procedure, and he feels this represents a normal response to treatment as a sterile necrosis -pain has improved- takes Oxycodone at home for pain- has been resumed here.    Diarrhea of unclear etiology; +recent atx exposure;  -resolved- c diff negative- pathogen panel negative  Low grade fevers - likely from recent embolization- no other source has been found   Liver Cirrhosis HCC, Hepatitis C; f/u with Dr. Alvy Bimler -not a surgical candidate; s/p bland arterial embolization of the dominant hypervascular mass within the peripheral aspect of the left lobe of the liver -cont home regimen; outpatient oncology follow up    Thrombocytopenia due to liver cirrhosis; no s/s of acute bleeding; cont monitor    Code Status: full Family Communication:  D/w patient and  Dorothy Burns Disposition Plan: SNF     Consultants:  IR  Procedures:  none  Antibiotics:  none (indicate start date, and stop date if known)    Objective: Filed Vitals:   04/16/14 0604  BP: 96/63  Pulse: 66  Temp: 97 F (36.1 C)  Resp: 16    Intake/Output Summary (Last 24 hours) at 04/16/14 0915 Last data filed at 04/15/14 1647  Gross per 24 hour  Intake    582 ml  Output      1 ml  Net    581 ml   Filed Weights   04/10/14 1655 04/11/14 0227  Weight: 62.596 kg (138 lb) 58.196 kg (128 lb 4.8 oz)    Exam:   General:  alert  Cardiovascular: s1,s2 rrr  Respiratory: CTA BL  Abdomen: soft, mild tender R UQ; no rebound   Musculoskeletal: no LE edeam   Data Reviewed: Basic Metabolic Panel:  Recent Labs Lab 04/10/14 1637 04/11/14 0805  NA 141 142  K 3.1* 3.7  CL 101 104  CO2 29 31  GLUCOSE 86 67*  BUN 13 13  CREATININE 1.05 1.03  CALCIUM 9.1 9.1   Liver Function Tests:  Recent Labs Lab 04/10/14 1637  AST 81*  ALT 26  ALKPHOS 185*  BILITOT 0.8  PROT 7.5  ALBUMIN 2.8*    Recent Labs Lab 04/10/14 1637  LIPASE 19    Recent Labs Lab 04/14/14 1210  AMMONIA 39   CBC:  Recent Labs Lab 04/10/14 1637 04/11/14 0805  WBC 3.0* 2.6*  NEUTROABS 1.6*  --   HGB 12.0 11.2*  HCT 35.1* 33.3*  MCV 94.6 92.8  PLT 104* 82*   Cardiac Enzymes: No  results found for this basename: CKTOTAL, CKMB, CKMBINDEX, TROPONINI,  in the last 168 hours BNP (last 3 results) No results found for this basename: PROBNP,  in the last 8760 hours CBG: No results found for this basename: GLUCAP,  in the last 168 hours  Recent Results (from the past 240 hour(s))  CLOSTRIDIUM DIFFICILE BY PCR     Status: None   Collection Time    04/11/14  3:07 PM      Result Value Ref Range Status   C difficile by pcr NEGATIVE  NEGATIVE Final     Studies: No results found.  Scheduled Meds: . colchicine  0.6 mg Oral Daily  . divalproex  250 mg Oral Q12H  . furosemide  20 mg Oral Daily  . gabapentin   300 mg Oral TID  . nicotine  7 mg Transdermal Q24H  . pantoprazole  40 mg Oral Daily  . sodium chloride  3 mL Intravenous Q12H   Continuous Infusions:   Principal Problem:   Abdominal pain, unspecified site Active Problems:   Substance abuse   Anemia of other chronic disease   Liver cirrhosis   Hepatocellular carcinoma   Protein-calorie malnutrition, severe   RUQ pain    Time spent: >35 minutes     [place]  [facility]  If 7PM-7AM, please contact night-coverage at [email or web address], password [facility] 04/16/2014, 9:15 AM  LOS: 6 days

## 2014-04-17 MED ORDER — CLONAZEPAM 2 MG PO TABS: 2.0000 mg | ORAL_TABLET | Freq: Two times a day (BID) | ORAL | Status: DC

## 2014-04-17 MED ORDER — OXYCODONE HCL 5 MG PO CAPS: 5.0000 mg | ORAL_CAPSULE | ORAL | Status: DC | PRN

## 2014-04-17 NOTE — Clinical Social Work Placement (Signed)
Clinical Social Work Department CLINICAL SOCIAL WORK PLACEMENT NOTE 04/17/2014  Patient:  Dorothy Burns, Dorothy Burns  Account Number:  [id number] Admit date:  04/10/2014  Clinical Social Worker:  Kemper Durie, [place]  Date/time:  04/17/2014 03:23 PM  Clinical Social Work is seeking post-discharge placement for this patient at the following level of care:   SKILLED NURSING   (*CSW will update this form in Epic as items are completed)   04/17/2014  Patient/family provided with [facility] of Clinical Social Work's list of facilities offering this level of care within the geographic area requested by the patient (or if unable, by the patient's family).  04/17/2014  Patient/family informed of their freedom to choose among providers that offer the needed level of care, that participate in Medicare, Medicaid or managed care program needed by the patient, have an available bed and are willing to accept the patient.  04/17/2014  Patient/family informed of [facility]' ownership interest in [facility], as well as of the fact that they are under no obligation to receive care at this facility.  PASARR submitted to EDS on 04/17/2014 PASARR number received on 04/17/2014  FL2 transmitted to all facilities in geographic area requested by pt/family on  04/17/2014 FL2 transmitted to all facilities within larger geographic area on 04/17/2014  Patient informed that his/her managed care company has contracts with or will negotiate with  certain facilities, including the following:     Patient/family informed of bed offers received:  04/17/2014 Patient chooses bed at [facility] [place] Physician recommends and patient chooses bed at    Patient to be transferred to [place] on  04/17/2014 Patient to be transferred to facility by Ambulance Patient and family notified of transfer on 04/17/2014 Name of family member notified:  Lunette Stands, CSW  attempted to reach other contacts without success. [place] Patient said she would update family.   The following physician request were entered in Epic:   Additional Comments:   Per MD patient ready for DC to [facility]. RN, patient, patient's family, and facility notified of DC. RN given number for report. DC packet on chart. [facility] transport requested for patient. CSW signing off.   Liz Beach MSW, [place], [place], [phone]

## 2014-04-17 NOTE — Progress Notes (Signed)
Physical Therapy Treatment Patient Details Name: Dorothy Burns MRN: [id number] DOB: December 30, 1955 Today's Date: 04/17/2014    History of Present Illness Abd pain; 58 yo female h/o liver cirrhosis with recent dx of hepatocellular carcinoma and arterial embolization by IR to lesion about 3 weeks ago comes in with ruq abd pain that is worsened since her procedure. Per radiology she this can be a normal symptom of this procedure. PMHx: multiple falls (one while here), COPD, HTN, Bi-polar    PT Comments    Pt walked in halls with no device for 200 feet.  Pt with loss of balance with turns and head turns.  Pt educated that she should use RW if she doesn't have someone providing safety guard for her.  Focus on fall prevention.  WIl benefit from advanced balance exercises.  Pt encouraged to take deep breaths.  Pt cooperative.    Follow Up Recommendations  SNF;Supervision for mobility/OOB     Equipment Recommendations   (to be assessed at [facility])    Recommendations for Other Services       Precautions / Restrictions Precautions Precautions: Fall Restrictions Weight Bearing Restrictions: No    Mobility  Bed Mobility Overal bed mobility: Modified Independent                Transfers Overall transfer level: Needs assistance   Transfers: Sit to/from Stand Sit to Stand: Min guard         General transfer comment: pt with cues for safety and hand placement  Ambulation/Gait Ambulation/Gait assistance: Min guard Ambulation Distance (Feet): 200 Feet Assistive device: None Gait Pattern/deviations: Step-through pattern     General Gait Details: pt wanted to walk with no device today.   I provided min guard for safety.  pt wanting ot hold to walls etc.  I had her not hold nad go slowly.  pt iwth loss of balance with turns and head turns but abel to self correct.  Pt educated that she would use RW if no one providing close min guard fo rher - she verbalized Automotive engineer [place] (Stroke Patients Only)       Balance                                    Cognition Arousal/Alertness: Awake/alert Behavior During Therapy: WFL for tasks assessed/performed Overall Cognitive Status: Within Functional Limits for tasks assessed                      Exercises      General Comments        Pertinent Vitals/Pain Pain Assessment: No/denies pain (no complaints of pain during therapy today)    Home Living                      Prior Function            PT Goals (current goals can now be found in the care plan section) Progress towards PT goals: Progressing toward goals    Frequency  Min 3X/week    PT Plan Current plan remains appropriate    Co-evaluation             End of Session Equipment Utilized During Treatment: Gait belt Activity Tolerance: Patient tolerated treatment well Patient left: in bed;with bed  alarm set;with call bell/phone within reach     Time: 1220-1235 PT Time Calculation (min): 15 min  Charges:  $Gait Training: 8-22 mins                    G Codes:      Loyal Buba 04/17/2014, 12:42 PM 04/17/2014   Rande Lawman, PT

## 2014-04-17 NOTE — Progress Notes (Signed)
[facility] PROGRESS NOTE  Dorothy Burns [id number] DOB: [birth year] DOA: 04/10/2014 PCP: Barbette Merino, MD  Assessment/Plan: 58 y.o. female with PMH of Liver Cirrhosis HCC, Hepatitis C, Polysubstance use,  recent bland embolization of the left lateral hepatic artery for treatment of large left HCC tumor who presented to RUQ pain after the procedure. CT done in ED showed New 5 cm fluid and gas collection associated with recently treated left lobe mass; Initially per ER, and admitting physician's discussion IR planned to perform aspiration/drainage of this collection; But on further discussion with IR: Dr. Pascal Lux, who performed embo procedure, and he feels this represents a normal response to treatment as a sterile necrosis - Pt also reports having diarrhea >7-10 times a day without vomiting, no hematochezia, or hematemesis; denies fever, chills, no chest pain, no SOB  HPI/Subjective: Pain controlled with PRN narcotics  A/P: RUQ pain - CT: New 5 cm fluid and gas collection associated with recently treated left lobe mass; -per IR: Dr. Pascal Lux, who performed embo procedure, and he feels this represents a normal response to treatment as a sterile necrosis -pain has improved- takes Oxycodone at home for pain- has been resumed here.    Diarrhea of unclear etiology - +recent atx exposure;  -resolved- c diff negative- pathogen panel negative  Low grade fevers - likely from recent embolization- no other source has been found- resolved after 9/27   Liver Cirrhosis HCC, Hepatitis C; f/u with Dr. Alvy Bimler -not a surgical candidate; s/p bland arterial embolization of the dominant hypervascular mass within the peripheral aspect of the left lobe of the liver -cont home regimen; outpatient oncology follow up    Thrombocytopenia due to liver cirrhosis; no s/s of acute bleeding; cont monitor    Code Status: full Family Communication:  D/w patient and  father Disposition Plan: SNF     Consultants:  IR  Procedures:  none  Antibiotics:  none (indicate start date, and stop date if known)    Objective: Filed Vitals:   04/17/14 0533  BP: 99/67  Pulse: 63  Temp: 98.3 F (36.8 C)  Resp: 16   No intake or output data in the 24 hours ending 04/17/14 1105 Filed Weights   04/10/14 1655 04/11/14 0227  Weight: 62.596 kg (138 lb) 58.196 kg (128 lb 4.8 oz)    Exam:   General:  alert  Cardiovascular: s1,s2 rrr  Respiratory: CTA BL  Abdomen: soft, mild tender R UQ; no rebound   Musculoskeletal: no LE edeam   Data Reviewed: Basic Metabolic Panel:  Recent Labs Lab 04/10/14 1637 04/11/14 0805  NA 141 142  K 3.1* 3.7  CL 101 104  CO2 29 31  GLUCOSE 86 67*  BUN 13 13  CREATININE 1.05 1.03  CALCIUM 9.1 9.1   Liver Function Tests:  Recent Labs Lab 04/10/14 1637  AST 81*  ALT 26  ALKPHOS 185*  BILITOT 0.8  PROT 7.5  ALBUMIN 2.8*    Recent Labs Lab 04/10/14 1637  LIPASE 19    Recent Labs Lab 04/14/14 1210  AMMONIA 39   CBC:  Recent Labs Lab 04/10/14 1637 04/11/14 0805  WBC 3.0* 2.6*  NEUTROABS 1.6*  --   HGB 12.0 11.2*  HCT 35.1* 33.3*  MCV 94.6 92.8  PLT 104* 82*   Cardiac Enzymes: No results found for this basename: CKTOTAL, CKMB, CKMBINDEX, TROPONINI,  in the last 168 hours BNP (last 3 results) No results found for this basename: PROBNP,  in the last 8760 hours CBG:  No results found for this basename: GLUCAP,  in the last 168 hours  Recent Results (from the past 240 hour(s))  CLOSTRIDIUM DIFFICILE BY PCR     Status: None   Collection Time    04/11/14  3:07 PM      Result Value Ref Range Status   C difficile by pcr NEGATIVE  NEGATIVE Final     Studies: No results found.  Scheduled Meds: . colchicine  0.6 mg Oral Daily  . divalproex  250 mg Oral Q12H  . furosemide  20 mg Oral Daily  . gabapentin  300 mg Oral TID  . nicotine  7 mg Transdermal Q24H  . pantoprazole  40 mg Oral Daily  . sodium  chloride  3 mL Intravenous Q12H   Continuous Infusions:   Principal Problem:   Abdominal pain, unspecified site Active Problems:   Substance abuse   Anemia of other chronic disease   Liver cirrhosis   Hepatocellular carcinoma   Protein-calorie malnutrition, severe   RUQ pain    Time spent: >35 minutes     [place]  [facility]  If 7PM-7AM, please contact night-coverage at [email or web address], password [facility] 04/17/2014, 11:05 AM  LOS: 7 days

## 2014-04-18 ENCOUNTER — Encounter: Payer: Self-pay | Admitting: Internal Medicine

## 2014-04-18 ENCOUNTER — Other Ambulatory Visit: Payer: Self-pay | Admitting: *Deleted

## 2014-04-18 ENCOUNTER — Non-Acute Institutional Stay (SKILLED_NURSING_FACILITY): Payer: Medicare Other | Admitting: Internal Medicine

## 2014-04-18 DIAGNOSIS — R188 Other ascites: Secondary | ICD-10-CM

## 2014-04-18 DIAGNOSIS — D696 Thrombocytopenia, unspecified: Secondary | ICD-10-CM

## 2014-04-18 DIAGNOSIS — R161 Splenomegaly, not elsewhere classified: Secondary | ICD-10-CM

## 2014-04-18 DIAGNOSIS — J439 Emphysema, unspecified: Secondary | ICD-10-CM

## 2014-04-18 DIAGNOSIS — K7460 Unspecified cirrhosis of liver: Secondary | ICD-10-CM

## 2014-04-18 DIAGNOSIS — E43 Unspecified severe protein-calorie malnutrition: Secondary | ICD-10-CM

## 2014-04-18 DIAGNOSIS — F1910 Other psychoactive substance abuse, uncomplicated: Secondary | ICD-10-CM

## 2014-04-18 DIAGNOSIS — I10 Essential (primary) hypertension: Secondary | ICD-10-CM

## 2014-04-18 DIAGNOSIS — C228 Malignant neoplasm of liver, primary, unspecified as to type: Secondary | ICD-10-CM

## 2014-04-18 DIAGNOSIS — C220 Liver cell carcinoma: Secondary | ICD-10-CM

## 2014-04-18 DIAGNOSIS — J438 Other emphysema: Secondary | ICD-10-CM

## 2014-04-18 DIAGNOSIS — F319 Bipolar disorder, unspecified: Secondary | ICD-10-CM

## 2014-04-18 MED ORDER — OXYCODONE HCL 5 MG PO CAPS: ORAL_CAPSULE | ORAL | Status: DC

## 2014-04-18 MED ORDER — CLONAZEPAM 2 MG PO TABS: ORAL_TABLET | ORAL | Status: DC

## 2014-04-18 NOTE — Progress Notes (Signed)
Patient ID: Dorothy Burns, female   DOB: [birth year], 58 y.o.   MRN: [id number]  Provider:  Rexene Edison. Mariea Clonts, D.O., C.M.D. Location:  [facility]  PCP: Barbette Merino, MD  Code Status: full code  Allergies  Allergen Reactions  . Tylenol [Acetaminophen]     Liver issues    Chief Complaint  Patient presents with  . New Admit To SNF    new admit for rehab s/p hospitalization with RUQ pain (hepatocellular ca)    HPI: 58 y.o. female with h/o bipolar disorder, htn, COPD, cirrhosis, polysubstance abuse, severe protein calorie malnutrition, low back pain, anemia of chronic disease admitted here for rehab s/p hospitalization with RUQ pain.    She has hepatocellular carcinoma and splenomegaly.  She had just undergone arterial embolization by IR to the liver lesion 3 wks ago.  There was a concerning area around the embolization site thought to be an abcess. IR was consulted, but felt that this was a normal response to the embolization and she had no other findings suggestive of infection.  She is to f/u with them in 1 month.    Her stay was complicated by diarrhea .  C diff and bacterial pathogens were negative.    She also has thrombocytopenia due to her cirrhosis.  ROS: Review of Systems  Constitutional: Negative for fever.  Eyes: Negative for blurred vision.  Respiratory: Negative for shortness of breath.   Cardiovascular: Negative for chest pain.  Gastrointestinal: Positive for abdominal pain and diarrhea. Negative for constipation.       RUQ  Genitourinary: Negative for dysuria, urgency and frequency.  Musculoskeletal: Negative for falls.  Skin: Negative for rash.  Neurological: Negative for dizziness.  Endo/Heme/Allergies: Bruises/bleeds easily.  Psychiatric/Behavioral: Positive for depression.     Past Medical History  Diagnosis Date  . Hypertension   . Bipolar 1 disorder   . Menorrhagia   . Post-menopausal bleeding     MOST RECENT BLEEDING  WAS NOV 2013  .  Paresthesia   . COPD (chronic obstructive pulmonary disease)   . 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 Gout   . Mental disorder     bipolar  . Asthma   . Shortness of breath   . Anemia   . GERD (gastroesophageal reflux disease)   . Gallstones     ABD PAIN, N&V  . Arthritis   . Headache(784.0)   . Hepatitis     Hep C x 10 yrs  - NO TREATMENT  . PONV (postoperative nausea and vomiting)   . Cirrhosis    Past Surgical History  Procedure Laterality Date  . Tubal ligation    . Facial reconstruction surgery    . Multiple tooth extractions    . Hysteroscopy w/d&c  09/29/2011    Procedure: DILATATION AND CURETTAGE /HYSTEROSCOPY;  Surgeon: Mora Bellman, MD;  Location: [facility];  Service: Gynecology;  Laterality: N/A;  . Cholecystectomy  08/20/2012    Procedure: LAPAROSCOPIC CHOLECYSTECTOMY;  Surgeon: Ralene Ok, MD;  Location: [facility];  Service: General;;  . Embolization of dominant hcc within left lobe of liver  03/23/14   Social History:   reports that she has been smoking Cigarettes.  She has a 7.5 pack-year smoking history. She has never used smokeless tobacco. She reports that she does not drink alcohol or use illicit drugs.  Family History  Problem Relation Age of Onset  . COPD Mother   . Diabetes Mother   .  Heart disease Mother   . Diabetes Father   . Heart disease Father   . Hypertension Father   . Cancer Father     basal cell ca and prostate ca    Medications: Patient's Medications  New Prescriptions   No medications on file  Previous Medications   BUDESONIDE-FORMOTEROL (SYMBICORT) 160-4.5 MCG/ACT INHALER    Inhale 2 puffs into the lungs 2 (two) times daily.   CLONAZEPAM (KLONOPIN) 2 MG TABLET    Take 1 tablet (2 mg total) by mouth 2 (two) times daily.   COLCHICINE 0.6 MG TABLET    Take 0.6 mg by mouth daily.   CYANOCOBALAMIN (B-12 PO)    Take 1 capsule by mouth daily.   DIVALPROEX (DEPAKOTE) 250 MG DR TABLET    Take 250 mg by mouth 2 (two) times  daily.   FUROSEMIDE (LASIX) 20 MG TABLET    Take 20 mg by mouth every morning.    GABAPENTIN (NEURONTIN) 300 MG CAPSULE    Take 300 mg by mouth 3 (three) times daily.   HYDROCHLOROTHIAZIDE (HYDRODIURIL) 25 MG TABLET    Take 25 mg by mouth every evening.   MULTIPLE VITAMIN (MULTIVITAMIN WITH MINERALS) TABS TABLET    Take 1 tablet by mouth daily.   OMEPRAZOLE (PRILOSEC) 20 MG CAPSULE    Take 20 mg by mouth daily.   OXYCODONE (OXY-IR) 5 MG CAPSULE    Take 1 capsule (5 mg total) by mouth every 4 (four) hours as needed for pain.   THIAMINE HCL (B-1 PO)    Take 1 capsule by mouth daily.  Modified Medications   No medications on file  Discontinued Medications   No medications on file     Physical Exam: Filed Vitals:   04/18/14 1039  BP: 128/76  Pulse: 80  Temp: 97.1 F (36.2 C)  Resp: 20  Height: 5\' 3"  (1.6 m)  Weight: 140 lb (63.504 kg)  Physical Exam  Constitutional: No distress.  HENT:  Head: Normocephalic and atraumatic.  Right Ear: External ear normal.  Left Ear: External ear normal.  Nose: Nose normal.  Mouth/Throat: Oropharynx is clear and moist.  Eyes: Conjunctivae and EOM are normal. Pupils are equal, round, and reactive to light. Scleral icterus is present.  Neck: Neck supple. No JVD present.  Cardiovascular: Normal rate, regular rhythm, normal heart sounds and intact distal pulses.   Pulmonary/Chest: Breath sounds normal. No respiratory distress.  Abdominal: Soft. Bowel sounds are normal. She exhibits distension. She exhibits no mass. There is no tenderness.  Abdominal ascites present  Musculoskeletal: Normal range of motion.  Lymphadenopathy:    She has no cervical adenopathy.  Neurological: She is alert.  Skin: Skin is warm and dry.  ecchymoses  Psychiatric:  Depressed affect     Labs reviewed: Basic Metabolic Panel:  Recent Labs  12/29/13 1724 12/30/13 0430  03/23/14 1030 04/10/14 1637 04/11/14 0805  NA 144 147  < > 139 141 142  K 2.5* 2.8*  < >  3.5* 3.1* 3.7  CL 104 107  < > 96 101 104  CO2 33* 31  < > 30 29 31   GLUCOSE 86 91  < > 83 86 67*  BUN 15 12  < > 13 13 13   CREATININE 0.93 0.98  < > 1.15* 1.05 1.03  CALCIUM 8.7 8.5  < > 9.1 9.1 9.1  MG  --  1.9  --   --   --   --   < > = values  in this interval not displayed. Liver Function Tests:  Recent Labs  12/30/13 0430 03/23/14 1030 04/10/14 1637  AST 107* 124* 81*  ALT 42* 30 26  ALKPHOS 130* 152* 185*  BILITOT 0.7 0.8 0.8  PROT 6.2 7.5 7.5  ALBUMIN 2.2* 3.0* 2.8*    Recent Labs  12/29/13 1724 04/10/14 1637  LIPASE 37 19    Recent Labs  12/29/13 1724 04/14/14 1210  AMMONIA 83* 39   CBC:  Recent Labs  12/16/13 1213  03/23/14 1030 04/10/14 1637 04/11/14 0805  WBC 4.5  < > 2.7* 3.0* 2.6*  NEUTROABS 2.1  --  1.1* 1.6*  --   HGB 11.4*  < > 12.9 12.0 11.2*  HCT 34.9  < > 37.7 35.1* 33.3*  MCV 90.5  < > 91.5 94.6 92.8  PLT 79*  < > 72* 104* 82*  < > = values in this interval not displayed. Cardiac Enzymes: No results found for this basename: CKTOTAL, CKMB, CKMBINDEX, TROPONINI,  in the last 8760 hours BNP: No components found with this basename: POCBNP,  CBG:  Recent Labs  01/01/14 0707 01/02/14 0745 01/03/14 0717  GLUCAP 82 134* 92    Imaging and Procedures: 04/10/14 CT abdomen/pelvis w/ contrast:   1. Cirrhosis with 2 hepatocellular carcinomas. New 5 cm fluid and gas collection associated with recently treated left lobe mass which  could represent abscess, sterile necrosis, or biloma.  2. Stable adenopathy in the deep liver drainage.  3. Portal hypertension with lower esophageal varices.   Assessment/Plan 1. Cirrhosis of liver with ascites, unspecified hepatic cirrhosis type -cont diuretics, monitor ascites, oxyIR for pain mgt -f/u electrolytes-may need to adjust diuretic regimen (also has gout on colchicine--need to check renal function in f/u)  2. Hepatocellular carcinoma -new diagnosis, not biopsy confirmed, is losing weight and  declining -f/u with Dr. Alvy Bimler for further mgt  3. HYPERTENSION, BENIGN ESSENTIAL -bp controlled with lasix and hctz--needs bmp monitored with two diuretics  4. Pulmonary emphysema, unspecified emphysema type -cont symbicort  5. Thrombocytopenia -f/u cbc, associated cirrhosis  6. Polysubstance abuse -prior to admission  7. Protein-calorie malnutrition, severe -has lost weight, had been abusing drugs, not eating well and now has hepatocellular ca -cont proteins supplements and encourage po  8. Splenomegaly -had bleeding s/p embolization, two hepatocellular masses seen -f/u with IR  9. DSORD BIPOLAR I, UNSPC, MOST RECENT EPSD -cont current therapy--? depakote best with liver ca and cirrhosis--monitor levels, also on klonopin, should be followed by [employer]  Family/ staff Communication: discussed with nursing  Labs/tests ordered:  Liver panel, bmp cbc, f/u Dr. Alvy Bimler and IR

## 2014-04-18 NOTE — Telephone Encounter (Signed)
[facility] 

## 2014-04-19 ENCOUNTER — Other Ambulatory Visit: Payer: Self-pay | Admitting: Hematology and Oncology

## 2014-04-19 ENCOUNTER — Telehealth: Payer: Self-pay | Admitting: *Deleted

## 2014-04-19 NOTE — Telephone Encounter (Signed)
Patient is multiple no-show to me. I will see her back after she sees a PCP first

## 2014-04-19 NOTE — Telephone Encounter (Signed)
Hans Eden,  at [facility] states pt needs f/u appt w/ Dr. Alvy Bimler.

## 2014-04-20 ENCOUNTER — Telehealth: Payer: Self-pay | Admitting: *Deleted

## 2014-04-20 NOTE — Telephone Encounter (Signed)
Message copied by Cathlean Cower on Thu Apr 20, 2014  8:41 AM ------      Message from: [facility], [place]: Wed Apr 19, 2014  4:37 PM      Regarding: RE: f/u appt       I guess that counts      10/12 at 1230 pm with labs prior, 30 mins      ----- Message -----         From: Cathlean Cower, RN         Sent: 04/19/2014   4:31 PM           To: Heath Lark, MD      Subject: f/u appt                                                 Pt is actually at Rehab right now and was seen Dr. Hollace Kinnier who asked the staff there to get pt scheduled to see you again.  Does that count as pt seeing a PCP first?  Dr. Cyndi Lennert note is in EMR.        ------

## 2014-04-21 ENCOUNTER — Telehealth: Payer: Self-pay | Admitting: Hematology and Oncology

## 2014-04-21 NOTE — Telephone Encounter (Signed)
s.w. [facility] and pt son with pt appts....all are ok and aware

## 2014-04-25 ENCOUNTER — Emergency Department (HOSPITAL_COMMUNITY)
Admission: EM | Admit: 2014-04-25 | Discharge: 2014-04-26 | Disposition: A | Discharge status: Home / Self Care | Payer: Medicare Other | Attending: Emergency Medicine | Admitting: Emergency Medicine

## 2014-04-25 ENCOUNTER — Emergency Department (HOSPITAL_COMMUNITY): Payer: Medicare Other

## 2014-04-25 ENCOUNTER — Encounter (HOSPITAL_COMMUNITY): Payer: Self-pay | Admitting: Emergency Medicine

## 2014-04-25 DIAGNOSIS — I10 Essential (primary) hypertension: Secondary | ICD-10-CM | POA: Insufficient documentation

## 2014-04-25 DIAGNOSIS — Z79899 Other long term (current) drug therapy: Secondary | ICD-10-CM | POA: Diagnosis not present

## 2014-04-25 DIAGNOSIS — Y9389 Activity, other specified: Secondary | ICD-10-CM | POA: Diagnosis not present

## 2014-04-25 DIAGNOSIS — Z8742 Personal history of other diseases of the female genital tract: Secondary | ICD-10-CM | POA: Diagnosis not present

## 2014-04-25 DIAGNOSIS — Y9289 Other specified places as the place of occurrence of the external cause: Secondary | ICD-10-CM | POA: Diagnosis not present

## 2014-04-25 DIAGNOSIS — Z720 Tobacco use: Secondary | ICD-10-CM | POA: Diagnosis not present

## 2014-04-25 DIAGNOSIS — S0083XA Contusion of other part of head, initial encounter: Secondary | ICD-10-CM | POA: Diagnosis not present

## 2014-04-25 DIAGNOSIS — Z7982 Long term (current) use of aspirin: Secondary | ICD-10-CM | POA: Diagnosis not present

## 2014-04-25 DIAGNOSIS — M109 Gout, unspecified: Secondary | ICD-10-CM | POA: Diagnosis not present

## 2014-04-25 DIAGNOSIS — J449 Chronic obstructive pulmonary disease, unspecified: Secondary | ICD-10-CM | POA: Insufficient documentation

## 2014-04-25 DIAGNOSIS — W1830XA Fall on same level, unspecified, initial encounter: Secondary | ICD-10-CM | POA: Insufficient documentation

## 2014-04-25 DIAGNOSIS — K219 Gastro-esophageal reflux disease without esophagitis: Secondary | ICD-10-CM | POA: Insufficient documentation

## 2014-04-25 DIAGNOSIS — F319 Bipolar disorder, unspecified: Secondary | ICD-10-CM | POA: Insufficient documentation

## 2014-04-25 DIAGNOSIS — S0990XA Unspecified injury of head, initial encounter: Secondary | ICD-10-CM | POA: Diagnosis present

## 2014-04-25 DIAGNOSIS — W19XXXA Unspecified fall, initial encounter: Secondary | ICD-10-CM

## 2014-04-25 DIAGNOSIS — M1990 Unspecified osteoarthritis, unspecified site: Secondary | ICD-10-CM | POA: Diagnosis not present

## 2014-04-25 DIAGNOSIS — D649 Anemia, unspecified: Secondary | ICD-10-CM | POA: Diagnosis not present

## 2014-04-25 LAB — CBC
HEMATOCRIT: 37.3 % (ref 36.0–46.0)
Hemoglobin: 12.2 g/dL (ref 12.0–15.0)
MCH: 31.7 pg (ref 26.0–34.0)
MCHC: 32.7 g/dL (ref 30.0–36.0)
MCV: 96.9 fL (ref 78.0–100.0)
PLATELETS: 52 10*3/uL — AB (ref 150–400)
RBC: 3.85 MIL/uL — ABNORMAL LOW (ref 3.87–5.11)
RDW: 16.3 % — AB (ref 11.5–15.5)
WBC: 2.4 10*3/uL — ABNORMAL LOW (ref 4.0–10.5)

## 2014-04-25 LAB — COMPREHENSIVE METABOLIC PANEL
ALBUMIN: 2.6 g/dL — AB (ref 3.5–5.2)
ALK PHOS: 158 U/L — AB (ref 39–117)
ALT: 25 U/L (ref 0–35)
ANION GAP: 10 (ref 5–15)
AST: 80 U/L — ABNORMAL HIGH (ref 0–37)
BUN: 17 mg/dL (ref 6–23)
CO2: 22 mEq/L (ref 19–32)
CREATININE: 0.91 mg/dL (ref 0.50–1.10)
Calcium: 8.9 mg/dL (ref 8.4–10.5)
Chloride: 107 mEq/L (ref 96–112)
GFR calc non Af Amer: 68 mL/min — ABNORMAL LOW (ref >90)
GFR, EST AFRICAN AMERICAN: 79 mL/min — AB (ref >90)
GLUCOSE: 90 mg/dL (ref 70–99)
POTASSIUM: 3.8 meq/L (ref 3.7–5.3)
Sodium: 139 mEq/L (ref 137–147)
Total Bilirubin: 1.1 mg/dL (ref 0.3–1.2)
Total Protein: 7.2 g/dL (ref 6.0–8.3)

## 2014-04-25 LAB — AMMONIA: Ammonia: 42 umol/L (ref 11–60)

## 2014-04-25 NOTE — ED Notes (Signed)
Per family, pt fell at nursing facility on Friday.  Pt daughter took her out of facility and has tried to change location.  Cannot get placement until pt is cleared.  No medical changes or s/sx from fall.

## 2014-04-25 NOTE — ED Notes (Signed)
Pt's contact:  Dorothy Burns (daughter)--- tel# [phone]

## 2014-04-26 NOTE — Progress Notes (Addendum)
CSW received call for assistance with a new skilled nursing facility. Patient was evaluated last night and discharged home. CSW spoke with pt daughter, Carey Bullocks who stated that patient was a resident at [facility], and due to patient fall and having concerns regarding the facility she opted to bring patient home with her to provide care. Pt daughter stated that she would like assistance with finding a new skilled nursing facility. Patient daughter stated that patient can not live alone, and family can only provide 24 hour care for a limited time period.  CSW provided active listening and assisted patient with strengths based approach. Patient family and csw identified strengths of strong family support, working with [facility] in the past, and pt daughters strong advocacy for patient   CSW emailed list of skilled nursing facilities for family too look into and provided education regarding process. CSW also referred patient to RN CM regarding home health needs including Social Work, PT, OT, Therapist, sports, and Aid. CSW also encouraged patient daughter to call facility to obtain previous FL2 to help with the process. Patient daughter thanked csw for concern and support. Patient daughter to follow up with RN CM, home health, [facility], and patient primary care doctor. .No further Clinical Social Work needs, signing off.    Belia Heman, [place]  ED CSW 04/26/2014 953am

## 2014-04-26 NOTE — Progress Notes (Signed)
  CARE MANAGEMENT ED NOTE 04/26/2014  Patient:  Dorothy Burns, Dorothy Burns   Account Number:  [id number]  Date Initiated:  04/26/2014  Documentation initiated by:  Dorothy Burns  Subjective/Objective Assessment:   58 yr old medicare/medicaid of [place] pt seen at [facility] ED 04/25/14 to 04/26/14 am c/o pt fell at nursing facility on Friday.  Pt daughter took her out of snf & has tried to change location.  Cannot get placement until pt is cleared     Subjective/Objective Assessment Detail:   Daughter Dorothy Burns [phone]  Was in snf but dtr opt to take pt home after falls  pt has her own apartment but was with dtr when first taken out of snf  Tried to get pt to another to snf from [facility] but not successful  confirmed pcp as dr [name]  choice of home health agency offered Pt & daughter states she has used [facility] and would like services from them again  Reports pt has a walker, an electric w/c     Action/Plan:   consulted by ED SW to assist with Home health services Spoke with daughter see notes below  Left a voice message for Cyril Mourning of [facility] [phone]  at 1448 04/26/14 providing referral information   Action/Plan Detail:   1428 Daughter tells cm want pt back in snf She nor brother can care for her Confirmed with dtr snf does not assist if pt d/c [facility] Referred to pcp to assist Home health staff to get forms for another snf placement   Anticipated DC Date:  04/26/2014     Status Recommendation to Physician:   Result of Recommendation:    Other ED Services  Consult Working Plan   In-house referral  Clinical Social Worker   DC Forensic scientist  Other  Outpatient Services - Pt will follow up   [place]   Choice offered to / List presented to:  C-4 Adult Children     HH arranged  HH-1 RN  [place].    Status of service:  Completed, signed off  ED  Comments:   ED Comments Detail:  04/26/14 1500 Spoke with Benjamine Mola (at [phone]) Dr [name] office to inform her of pt ED visit and to request Dr [name] be updated that pt and family chose [facility] to assist with home services and eventually wants pt to return to a snf with his assistance 1438 Spoke with [name] daughter at [phone], assessed needs and discussed with [name] that Cm will assist with getting orders initiated for home health services through [facility] (pt/daughter choice) for [facility]

## 2014-04-26 NOTE — ED Provider Notes (Signed)
CSN: [id number]     Arrival date & time 04/25/14  1745 History   First MD Initiated Contact with Patient 04/25/14 2140     Chief Complaint  Patient presents with  . Fall  . Head Injury      HPI The patient had a fall at a rehabilitation facility on Friday.  The daughter became frustrated with the care that her mother was receiving at the facility and therefore checked her out at the facility and brought her home.  The patient is having difficulty finding her new placement at this time and after speaking with multiple people the day they recommended that she come to the emergency department for evaluation and placement.  As reported that the patient had a fall several days ago which resulted in a bruise to the right head.  No change in mental status per family.  No recent nausea vomiting.  No fevers or chills.  Patient does report mild amount of pain around her right clavicle and right shoulder.  She's been compliant with her medications.  The patient has a history of hypertension and bipolar disorder.  She also has a history of hepatitis and cirrhosis.   Past Medical History  Diagnosis Date  . Hypertension   . Bipolar 1 disorder   . Menorrhagia   . Post-menopausal bleeding     MOST RECENT BLEEDING  WAS NOV 2013  . Paresthesia   . COPD (chronic obstructive pulmonary disease)   . 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 Gout   . Mental disorder     bipolar  . Asthma   . Shortness of breath   . Anemia   . GERD (gastroesophageal reflux disease)   . Gallstones     ABD PAIN, N&V  . Arthritis   . Headache(784.0)   . Hepatitis     Hep C x 10 yrs  - NO TREATMENT  . PONV (postoperative nausea and vomiting)   . Cirrhosis    Past Surgical History  Procedure Laterality Date  . Tubal ligation    . Facial reconstruction surgery    . Multiple tooth extractions    . Hysteroscopy w/d&c  09/29/2011    Procedure: DILATATION AND CURETTAGE /HYSTEROSCOPY;  Surgeon: Mora Bellman,  MD;  Location: [facility];  Service: Gynecology;  Laterality: N/A;  . Cholecystectomy  08/20/2012    Procedure: LAPAROSCOPIC CHOLECYSTECTOMY;  Surgeon: Ralene Ok, MD;  Location: [facility];  Service: General;;  . 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 COPD Mother   . Diabetes Mother   . Heart disease Mother   . Diabetes Father   . Heart disease Father   . Hypertension Father   . Cancer Father     basal cell ca and prostate ca   History  Substance Use Topics  . Smoking status: Current Every Day Smoker -- 0.25 packs/day for 30 years    Types: Cigarettes  . Smokeless tobacco: Never Used     Comment: in process of quitting---would like nicotine patch if insurance covers it  . Alcohol Use: No     Comment: recovering alcoholic    NO COCAINE SINCE 1990   OB History   Grav Para Term Preterm Abortions TAB SAB Ect Mult Living   8 4 4  0 4 1 3   4      Review of Systems  All other  systems reviewed and are negative.     Allergies  Nsaids and Tylenol  Home Medications   Prior to Admission medications   Medication Sig Start Date End Date Taking? Authorizing Provider  aspirin 81 MG tablet Take 243 mg by mouth once.   Yes Historical Provider, MD  budesonide-formoterol (SYMBICORT) 160-4.5 MCG/ACT inhaler Inhale 2 puffs into the lungs 2 (two) times daily.   Yes Historical Provider, MD  clonazePAM Bobbye Charleston) 2 MG tablet Take one tablet by mouth twice daily 04/18/14  Yes Mahima Pandey, MD  colchicine 0.6 MG tablet Take 0.6 mg by mouth daily with breakfast.    Yes Historical Provider, MD  Cyanocobalamin (B-12 PO) Take 1 capsule by mouth daily with breakfast.    Yes Historical Provider, MD  divalproex (DEPAKOTE) 250 MG DR tablet Take 250 mg by mouth 2 (two) times daily.   Yes Historical Provider, MD  furosemide (LASIX) 20 MG tablet Take 20 mg by mouth every morning.    Yes Historical Provider, MD  gabapentin (NEURONTIN)  300 MG capsule Take 300 mg by mouth 3 (three) times daily. 10/17/13  Yes Historical Provider, MD  hydrochlorothiazide (HYDRODIURIL) 25 MG tablet Take 25 mg by mouth every evening.   Yes Historical Provider, MD  Multiple Vitamin (MULTIVITAMIN WITH MINERALS) TABS tablet Take 1 tablet by mouth daily.   Yes Historical Provider, MD  omeprazole (PRILOSEC) 20 MG capsule Take 20 mg by mouth daily with breakfast.    Yes Historical Provider, MD  oxycodone (OXY-IR) 5 MG capsule Take one to two tablets by mouth every 4 hours as needed for pain 04/18/14  Yes Mahima Pandey, MD  Thiamine HCl (B-1 PO) Take 1 capsule by mouth daily.   Yes Historical Provider, MD   BP 127/86  Pulse 69  Temp(Src) 97.9 F (36.6 C) (Oral)  Resp 15  SpO2 100%  LMP 05/21/2012 Physical Exam  Nursing note and vitals reviewed. Constitutional: She is oriented to person, place, and time. She appears well-developed and well-nourished. No distress.  HENT:  Head: Normocephalic.  Small right frontal bruise without hematoma.  No abrasions or lacerations.  Eyes: EOM are normal.  Neck: Normal range of motion.  C-spine nontender.  C-spine cleared by Nexus criteria.  Cardiovascular: Normal rate, regular rhythm and normal heart sounds.   Pulmonary/Chest: Effort normal and breath sounds normal.  Abdominal: Soft. She exhibits no distension. There is no tenderness.  Musculoskeletal: Normal range of motion.  Neurological: She is alert and oriented to person, place, and time.  Skin: Skin is warm and dry.  Psychiatric: She has a normal mood and affect. Judgment normal.    ED Course  Procedures (including critical care time) Labs Review Labs Reviewed  CBC - Abnormal; Notable for the following:    WBC 2.4 (*)    RBC 3.85 (*)    RDW 16.3 (*)    Platelets 52 (*)    All other components within normal limits  COMPREHENSIVE METABOLIC PANEL - Abnormal; Notable for the following:    Albumin 2.6 (*)    AST 80 (*)    Alkaline Phosphatase 158  (*)    GFR calc non Af Amer 68 (*)    GFR calc Af Amer 79 (*)    All other components within normal limits  AMMONIA    Imaging Review Ct Head Wo Contrast  04/25/2014   CLINICAL DATA:  Initial encounter for fall from bed and a nursing facility. No loss of consciousness. Persistent weakness and dizziness. The fall  was 4 days ago. Unknown loss of consciousness.  EXAM: CT HEAD WITHOUT CONTRAST  TECHNIQUE: Contiguous axial images were obtained from the base of the skull through the vertex without intravenous contrast.  COMPARISON:  CT head without contrast 12/01/2013.  FINDINGS: Mild generalized atrophy and white matter disease stable. This is advanced for age. Ventricles are proportionate to the degree of atrophy. No significant extra-axial fluid collection is present. Postsurgical changes of the right maxilla and lateral orbit are again seen. Remote orbital wall fracture is noted.  No focal extracranial soft tissue injury is evident. The paranasal sinuses and mastoid air cells are clear. The osseous skull is otherwise intact.  IMPRESSION: 1. Stable atrophy and white matter disease. 2. No acute intracranial abnormality. 3. Postsurgical changes of the right maxilla and right lateral orbit.   Electronically Signed   By: Lawrence Santiago M.D.   On: 04/25/2014 23:57   Dg Shoulder Left  04/26/2014   CLINICAL DATA:  Initial encounter for fall at nursing home. The patient is unsure she struck anything. The fall was 4 days ago. Anterior left shoulder pain.  EXAM: LEFT SHOULDER - 2+ VIEW  COMPARISON:  Left shoulder radiographs 06/11/2009.  FINDINGS: A remote left clavicle fracture is again seen. No acute fracture is present. The left shoulder is located. The left hemi thorax is clear.  IMPRESSION: 1. No acute abnormality or significant interval change. 2. Remote left clavicle fracture.   Electronically Signed   By: Lawrence Santiago M.D.   On: 04/26/2014 00:01  I personally reviewed the imaging tests through PACS  system I reviewed available ER/hospitalization records through the EMR    EKG Interpretation None      MDM   Final diagnoses:  Fall, initial encounter    Patient is without acute complaints at this time.  My case management team is not in-house at this time and therefore a message was left with him.  They will be able to respond to the patient and the patient's daughter in the morning time.  CT head demonstrates no abnormalities.  Labs without significant abnormality.    Hoy Morn, MD 04/26/14 [phone]

## 2014-04-27 ENCOUNTER — Other Ambulatory Visit: Payer: Self-pay

## 2014-05-01 ENCOUNTER — Ambulatory Visit: Payer: Self-pay | Admitting: Hematology and Oncology

## 2014-05-01 ENCOUNTER — Encounter (HOSPITAL_COMMUNITY): Payer: Self-pay | Admitting: Emergency Medicine

## 2014-05-01 ENCOUNTER — Other Ambulatory Visit: Payer: Self-pay

## 2014-05-01 DIAGNOSIS — Y9289 Other specified places as the place of occurrence of the external cause: Secondary | ICD-10-CM | POA: Insufficient documentation

## 2014-05-01 DIAGNOSIS — Z8505 Personal history of malignant neoplasm of liver: Secondary | ICD-10-CM

## 2014-05-01 DIAGNOSIS — I10 Essential (primary) hypertension: Secondary | ICD-10-CM

## 2014-05-01 DIAGNOSIS — K219 Gastro-esophageal reflux disease without esophagitis: Secondary | ICD-10-CM

## 2014-05-01 DIAGNOSIS — C229 Malignant neoplasm of liver, not specified as primary or secondary: Secondary | ICD-10-CM | POA: Diagnosis not present

## 2014-05-01 DIAGNOSIS — M1990 Unspecified osteoarthritis, unspecified site: Secondary | ICD-10-CM

## 2014-05-01 DIAGNOSIS — Z8742 Personal history of other diseases of the female genital tract: Secondary | ICD-10-CM

## 2014-05-01 DIAGNOSIS — Z720 Tobacco use: Secondary | ICD-10-CM

## 2014-05-01 DIAGNOSIS — R109 Unspecified abdominal pain: Secondary | ICD-10-CM | POA: Diagnosis not present

## 2014-05-01 DIAGNOSIS — F1410 Cocaine abuse, uncomplicated: Secondary | ICD-10-CM

## 2014-05-01 DIAGNOSIS — M109 Gout, unspecified: Secondary | ICD-10-CM

## 2014-05-01 DIAGNOSIS — S22080A Wedge compression fracture of T11-T12 vertebra, initial encounter for closed fracture: Secondary | ICD-10-CM | POA: Insufficient documentation

## 2014-05-01 DIAGNOSIS — Z7982 Long term (current) use of aspirin: Secondary | ICD-10-CM

## 2014-05-01 DIAGNOSIS — D649 Anemia, unspecified: Secondary | ICD-10-CM

## 2014-05-01 DIAGNOSIS — Z7951 Long term (current) use of inhaled steroids: Secondary | ICD-10-CM

## 2014-05-01 DIAGNOSIS — X58XXXA Exposure to other specified factors, initial encounter: Secondary | ICD-10-CM | POA: Insufficient documentation

## 2014-05-01 DIAGNOSIS — F319 Bipolar disorder, unspecified: Secondary | ICD-10-CM | POA: Insufficient documentation

## 2014-05-01 DIAGNOSIS — J449 Chronic obstructive pulmonary disease, unspecified: Secondary | ICD-10-CM

## 2014-05-01 DIAGNOSIS — Z79899 Other long term (current) drug therapy: Secondary | ICD-10-CM | POA: Insufficient documentation

## 2014-05-01 DIAGNOSIS — Y9389 Activity, other specified: Secondary | ICD-10-CM | POA: Insufficient documentation

## 2014-05-01 LAB — CBC WITH DIFFERENTIAL/PLATELET
Basophils Absolute: 0 10*3/uL (ref 0.0–0.1)
Basophils Relative: 0 % (ref 0–1)
Eosinophils Absolute: 0 10*3/uL (ref 0.0–0.7)
Eosinophils Relative: 2 % (ref 0–5)
HEMATOCRIT: 33.2 % — AB (ref 36.0–46.0)
Hemoglobin: 11 g/dL — ABNORMAL LOW (ref 12.0–15.0)
LYMPHS PCT: 24 % (ref 12–46)
Lymphs Abs: 0.5 10*3/uL — ABNORMAL LOW (ref 0.7–4.0)
MCH: 31.4 pg (ref 26.0–34.0)
MCHC: 33.1 g/dL (ref 30.0–36.0)
MCV: 94.9 fL (ref 78.0–100.0)
Monocytes Absolute: 0.2 10*3/uL (ref 0.1–1.0)
Monocytes Relative: 12 % (ref 3–12)
NEUTROS ABS: 1.2 10*3/uL — AB (ref 1.7–7.7)
NEUTROS PCT: 62 % (ref 43–77)
Platelets: 56 10*3/uL — ABNORMAL LOW (ref 150–400)
RBC: 3.5 MIL/uL — ABNORMAL LOW (ref 3.87–5.11)
RDW: 15.8 % — ABNORMAL HIGH (ref 11.5–15.5)
WBC: 1.9 10*3/uL — AB (ref 4.0–10.5)

## 2014-05-01 LAB — COMPREHENSIVE METABOLIC PANEL
ALBUMIN: 2.8 g/dL — AB (ref 3.5–5.2)
ALT: 23 U/L (ref 0–35)
AST: 82 U/L — ABNORMAL HIGH (ref 0–37)
Alkaline Phosphatase: 161 U/L — ABNORMAL HIGH (ref 39–117)
Anion gap: 11 (ref 5–15)
BUN: 14 mg/dL (ref 6–23)
CO2: 27 meq/L (ref 19–32)
Calcium: 9.5 mg/dL (ref 8.4–10.5)
Chloride: 101 mEq/L (ref 96–112)
Creatinine, Ser: 0.98 mg/dL (ref 0.50–1.10)
GFR calc Af Amer: 72 mL/min — ABNORMAL LOW (ref >90)
GFR, EST NON AFRICAN AMERICAN: 62 mL/min — AB (ref >90)
Glucose, Bld: 112 mg/dL — ABNORMAL HIGH (ref 70–99)
POTASSIUM: 3.7 meq/L (ref 3.7–5.3)
SODIUM: 139 meq/L (ref 137–147)
Total Bilirubin: 0.9 mg/dL (ref 0.3–1.2)
Total Protein: 7.6 g/dL (ref 6.0–8.3)

## 2014-05-01 LAB — LIPASE, BLOOD: Lipase: 21 U/L (ref 11–59)

## 2014-05-01 MED ORDER — SODIUM CHLORIDE 0.9 % IV BOLUS (SEPSIS)
500.0000 mL | Freq: Once | INTRAVENOUS | Status: AC
  Administered 2014-05-01: 500 mL via INTRAVENOUS

## 2014-05-01 NOTE — ED Notes (Signed)
Pt. Arrived with EMS from home , reports mid abdominal pain / low back pain for 2 weeks worse these past several days , denies injury or fall / no  nausea or vomitting , denies fever or chills.

## 2014-05-02 ENCOUNTER — Other Ambulatory Visit: Payer: Self-pay

## 2014-05-02 ENCOUNTER — Inpatient Hospital Stay (HOSPITAL_COMMUNITY)
Admission: EM | Admit: 2014-05-02 | Discharge: 2014-05-09 | DRG: 435 | Disposition: A | Discharge status: Skilled Nursing Facility | Payer: Medicare Other | Attending: Internal Medicine | Admitting: Internal Medicine

## 2014-05-02 ENCOUNTER — Emergency Department (HOSPITAL_COMMUNITY)
Admission: EM | Admit: 2014-05-02 | Discharge: 2014-05-02 | Disposition: A | Discharge status: Home / Self Care | Payer: Medicare Other | Source: Home / Self Care | Attending: Emergency Medicine | Admitting: Emergency Medicine

## 2014-05-02 ENCOUNTER — Other Ambulatory Visit: Payer: Self-pay | Admitting: *Deleted

## 2014-05-02 ENCOUNTER — Emergency Department (HOSPITAL_COMMUNITY): Payer: Medicare Other

## 2014-05-02 ENCOUNTER — Telehealth: Payer: Self-pay | Admitting: Hematology and Oncology

## 2014-05-02 ENCOUNTER — Encounter (HOSPITAL_COMMUNITY): Payer: Self-pay | Admitting: Emergency Medicine

## 2014-05-02 DIAGNOSIS — B1920 Unspecified viral hepatitis C without hepatic coma: Secondary | ICD-10-CM | POA: Diagnosis present

## 2014-05-02 DIAGNOSIS — Z833 Family history of diabetes mellitus: Secondary | ICD-10-CM

## 2014-05-02 DIAGNOSIS — M4854XA Collapsed vertebra, not elsewhere classified, thoracic region, initial encounter for fracture: Secondary | ICD-10-CM | POA: Diagnosis present

## 2014-05-02 DIAGNOSIS — C229 Malignant neoplasm of liver, not specified as primary or secondary: Secondary | ICD-10-CM

## 2014-05-02 DIAGNOSIS — K648 Other hemorrhoids: Secondary | ICD-10-CM

## 2014-05-02 DIAGNOSIS — C220 Liver cell carcinoma: Secondary | ICD-10-CM

## 2014-05-02 DIAGNOSIS — K5900 Constipation, unspecified: Secondary | ICD-10-CM

## 2014-05-02 DIAGNOSIS — K766 Portal hypertension: Secondary | ICD-10-CM | POA: Diagnosis present

## 2014-05-02 DIAGNOSIS — K5909 Other constipation: Secondary | ICD-10-CM | POA: Diagnosis present

## 2014-05-02 DIAGNOSIS — F1490 Cocaine use, unspecified, uncomplicated: Secondary | ICD-10-CM

## 2014-05-02 DIAGNOSIS — F39 Unspecified mood [affective] disorder: Secondary | ICD-10-CM | POA: Diagnosis present

## 2014-05-02 DIAGNOSIS — G9340 Encephalopathy, unspecified: Secondary | ICD-10-CM | POA: Diagnosis present

## 2014-05-02 DIAGNOSIS — J449 Chronic obstructive pulmonary disease, unspecified: Secondary | ICD-10-CM | POA: Diagnosis present

## 2014-05-02 DIAGNOSIS — K7460 Unspecified cirrhosis of liver: Secondary | ICD-10-CM | POA: Diagnosis present

## 2014-05-02 DIAGNOSIS — R079 Chest pain, unspecified: Secondary | ICD-10-CM

## 2014-05-02 DIAGNOSIS — E860 Dehydration: Secondary | ICD-10-CM | POA: Diagnosis present

## 2014-05-02 DIAGNOSIS — E876 Hypokalemia: Secondary | ICD-10-CM | POA: Diagnosis not present

## 2014-05-02 DIAGNOSIS — F1910 Other psychoactive substance abuse, uncomplicated: Secondary | ICD-10-CM

## 2014-05-02 DIAGNOSIS — B182 Chronic viral hepatitis C: Secondary | ICD-10-CM | POA: Diagnosis present

## 2014-05-02 DIAGNOSIS — Z79899 Other long term (current) drug therapy: Secondary | ICD-10-CM

## 2014-05-02 DIAGNOSIS — I10 Essential (primary) hypertension: Secondary | ICD-10-CM | POA: Diagnosis present

## 2014-05-02 DIAGNOSIS — Z7982 Long term (current) use of aspirin: Secondary | ICD-10-CM

## 2014-05-02 DIAGNOSIS — M109 Gout, unspecified: Secondary | ICD-10-CM | POA: Diagnosis present

## 2014-05-02 DIAGNOSIS — F1410 Cocaine abuse, uncomplicated: Secondary | ICD-10-CM | POA: Diagnosis present

## 2014-05-02 DIAGNOSIS — R634 Abnormal weight loss: Secondary | ICD-10-CM

## 2014-05-02 DIAGNOSIS — S22080A Wedge compression fracture of T11-T12 vertebra, initial encounter for closed fracture: Secondary | ICD-10-CM

## 2014-05-02 DIAGNOSIS — G893 Neoplasm related pain (acute) (chronic): Secondary | ICD-10-CM | POA: Diagnosis present

## 2014-05-02 DIAGNOSIS — F17210 Nicotine dependence, cigarettes, uncomplicated: Secondary | ICD-10-CM | POA: Diagnosis present

## 2014-05-02 DIAGNOSIS — Z886 Allergy status to analgesic agent status: Secondary | ICD-10-CM

## 2014-05-02 DIAGNOSIS — F3130 Bipolar disorder, current episode depressed, mild or moderate severity, unspecified: Secondary | ICD-10-CM | POA: Diagnosis present

## 2014-05-02 DIAGNOSIS — Z515 Encounter for palliative care: Secondary | ICD-10-CM

## 2014-05-02 DIAGNOSIS — F14120 Cocaine abuse with intoxication, uncomplicated: Secondary | ICD-10-CM

## 2014-05-02 DIAGNOSIS — E43 Unspecified severe protein-calorie malnutrition: Secondary | ICD-10-CM | POA: Diagnosis present

## 2014-05-02 DIAGNOSIS — R109 Unspecified abdominal pain: Secondary | ICD-10-CM | POA: Diagnosis present

## 2014-05-02 DIAGNOSIS — Z8249 Family history of ischemic heart disease and other diseases of the circulatory system: Secondary | ICD-10-CM

## 2014-05-02 DIAGNOSIS — I8500 Esophageal varices without bleeding: Secondary | ICD-10-CM | POA: Diagnosis present

## 2014-05-02 DIAGNOSIS — R45851 Suicidal ideations: Secondary | ICD-10-CM | POA: Diagnosis present

## 2014-05-02 DIAGNOSIS — Z66 Do not resuscitate: Secondary | ICD-10-CM

## 2014-05-02 DIAGNOSIS — R630 Anorexia: Secondary | ICD-10-CM

## 2014-05-02 DIAGNOSIS — D61818 Other pancytopenia: Secondary | ICD-10-CM | POA: Diagnosis present

## 2014-05-02 DIAGNOSIS — F319 Bipolar disorder, unspecified: Secondary | ICD-10-CM

## 2014-05-02 DIAGNOSIS — K649 Unspecified hemorrhoids: Secondary | ICD-10-CM | POA: Diagnosis present

## 2014-05-02 DIAGNOSIS — Z825 Family history of asthma and other chronic lower respiratory diseases: Secondary | ICD-10-CM

## 2014-05-02 DIAGNOSIS — K219 Gastro-esophageal reflux disease without esophagitis: Secondary | ICD-10-CM | POA: Diagnosis present

## 2014-05-02 HISTORY — DX: Malignant (primary) neoplasm, unspecified: C80.1

## 2014-05-02 HISTORY — DX: Malignant neoplasm of liver, not specified as primary or secondary: C22.9

## 2014-05-02 LAB — URINALYSIS, ROUTINE W REFLEX MICROSCOPIC
BILIRUBIN URINE: NEGATIVE
Bilirubin Urine: NEGATIVE
GLUCOSE, UA: NEGATIVE mg/dL
GLUCOSE, UA: NEGATIVE mg/dL
Hgb urine dipstick: NEGATIVE
KETONES UR: NEGATIVE mg/dL
KETONES UR: 15 mg/dL — AB
LEUKOCYTES UA: NEGATIVE
Nitrite: NEGATIVE
Nitrite: NEGATIVE
PH: 6.5 (ref 5.0–8.0)
PROTEIN: NEGATIVE mg/dL
Protein, ur: NEGATIVE mg/dL
Specific Gravity, Urine: 1.009 (ref 1.005–1.030)
Specific Gravity, Urine: 1.011 (ref 1.005–1.030)
Urobilinogen, UA: 1 mg/dL (ref 0.0–1.0)
Urobilinogen, UA: 1 mg/dL (ref 0.0–1.0)
pH: 7 (ref 5.0–8.0)

## 2014-05-02 LAB — URINE MICROSCOPIC-ADD ON

## 2014-05-02 LAB — COMPREHENSIVE METABOLIC PANEL
ALK PHOS: 177 U/L — AB (ref 39–117)
ALT: 24 U/L (ref 0–35)
AST: 89 U/L — AB (ref 0–37)
Albumin: 2.9 g/dL — ABNORMAL LOW (ref 3.5–5.2)
Anion gap: 13 (ref 5–15)
BILIRUBIN TOTAL: 1.2 mg/dL (ref 0.3–1.2)
BUN: 16 mg/dL (ref 6–23)
CALCIUM: 9.6 mg/dL (ref 8.4–10.5)
CHLORIDE: 103 meq/L (ref 96–112)
CO2: 25 mEq/L (ref 19–32)
Creatinine, Ser: 0.84 mg/dL (ref 0.50–1.10)
GFR, EST AFRICAN AMERICAN: 87 mL/min — AB (ref >90)
GFR, EST NON AFRICAN AMERICAN: 75 mL/min — AB (ref >90)
GLUCOSE: 82 mg/dL (ref 70–99)
POTASSIUM: 3.6 meq/L — AB (ref 3.7–5.3)
SODIUM: 141 meq/L (ref 137–147)
Total Protein: 8 g/dL (ref 6.0–8.3)

## 2014-05-02 LAB — CBC WITH DIFFERENTIAL/PLATELET
BASOS ABS: 0 10*3/uL (ref 0.0–0.1)
Basophils Relative: 0 % (ref 0–1)
EOS PCT: 1 % (ref 0–5)
Eosinophils Absolute: 0 10*3/uL (ref 0.0–0.7)
HCT: 37.7 % (ref 36.0–46.0)
Hemoglobin: 12.5 g/dL (ref 12.0–15.0)
LYMPHS PCT: 12 % (ref 12–46)
Lymphs Abs: 0.6 10*3/uL — ABNORMAL LOW (ref 0.7–4.0)
MCH: 31.2 pg (ref 26.0–34.0)
MCHC: 33.2 g/dL (ref 30.0–36.0)
MCV: 94 fL (ref 78.0–100.0)
Monocytes Absolute: 0.5 10*3/uL (ref 0.1–1.0)
Monocytes Relative: 11 % (ref 3–12)
NEUTROS ABS: 3.5 10*3/uL (ref 1.7–7.7)
NEUTROS PCT: 76 % (ref 43–77)
PLATELETS: 53 10*3/uL — AB (ref 150–400)
RBC: 4.01 MIL/uL (ref 3.87–5.11)
RDW: 15.6 % — AB (ref 11.5–15.5)
WBC: 4.6 10*3/uL (ref 4.0–10.5)

## 2014-05-02 LAB — RAPID URINE DRUG SCREEN, HOSP PERFORMED
AMPHETAMINES: NOT DETECTED
Amphetamines: NOT DETECTED
BARBITURATES: NOT DETECTED
BENZODIAZEPINES: NOT DETECTED
BENZODIAZEPINES: NOT DETECTED
Barbiturates: NOT DETECTED
COCAINE: POSITIVE — AB
Cocaine: POSITIVE — AB
Opiates: NOT DETECTED
Opiates: NOT DETECTED
TETRAHYDROCANNABINOL: NOT DETECTED
TETRAHYDROCANNABINOL: NOT DETECTED

## 2014-05-02 LAB — SALICYLATE LEVEL: Salicylate Lvl: <2 mg/dL — ABNORMAL LOW (ref 2.8–20.0)

## 2014-05-02 LAB — I-STAT CG4 LACTIC ACID, ED: Lactic Acid, Venous: 2.4 mmol/L — ABNORMAL HIGH (ref 0.5–2.2)

## 2014-05-02 LAB — TROPONIN I: Troponin I: <0.3 ng/mL (ref <0.30)

## 2014-05-02 LAB — PREGNANCY, URINE: Preg Test, Ur: NEGATIVE

## 2014-05-02 LAB — AMMONIA
AMMONIA: 45 umol/L (ref 11–60)
Ammonia: 39 umol/L (ref 11–60)

## 2014-05-02 LAB — LIPASE, BLOOD: Lipase: 14 U/L (ref 11–59)

## 2014-05-02 LAB — ACETAMINOPHEN LEVEL

## 2014-05-02 LAB — I-STAT TROPONIN, ED: Troponin i, poc: 0.02 ng/mL (ref 0.00–0.08)

## 2014-05-02 LAB — ETHANOL

## 2014-05-02 MED ORDER — SODIUM CHLORIDE 0.9 % IV BOLUS (SEPSIS)
1000.0000 mL | Freq: Once | INTRAVENOUS | Status: AC
  Administered 2014-05-02: 1000 mL via INTRAVENOUS

## 2014-05-02 MED ORDER — METHOCARBAMOL 500 MG PO TABS: 750.0000 mg | ORAL_TABLET | Freq: Three times a day (TID) | ORAL | Status: DC | PRN

## 2014-05-02 MED ORDER — METHOCARBAMOL 500 MG PO TABS: 500.0000 mg | ORAL_TABLET | Freq: Three times a day (TID) | ORAL | Status: DC | PRN

## 2014-05-02 MED ORDER — METHOCARBAMOL 500 MG PO TABS
500.0000 mg | ORAL_TABLET | Freq: Once | ORAL | Status: DC
  Filled 2014-05-02: qty 1
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
## 2014-05-02 NOTE — ED Notes (Signed)
Tried to get UA pt was taken  to restroom just before coming back to her room, will try in 30 min.

## 2014-05-02 NOTE — ED Notes (Signed)
Pts father is on his way to pick up pt according to pts daughter.

## 2014-05-02 NOTE — Discharge Instructions (Signed)
Given your frequent falls, you should have someone around you at all times if possible.  You appear to be very underweight, and should supplement your diet with ENSURE or other meal shakes.  Take medications as prescribed.   Back, Compression Fracture A compression fracture happens when a force is put upon the length of your spine. Slipping and falling on your bottom are examples of such a force. When this happens, sometimes the force is great enough to compress the building blocks (vertebral bodies) of your spine. Although this causes a lot of pain, this can usually be treated at home, unless your caregiver feels hospitalization is needed for pain control. Your backbone (spinal column) is made up of 24 main vertebral bodies in addition to the sacrum and coccyx (see illustration). These are held together by tough fibrous tissues (ligaments) and by support of your muscles. Nerve roots pass through the openings between the vertebrae. A sudden wrenching move, injury, or a fall may cause a compression fracture of one of the vertebral bodies. This may result in back pain or spread of pain into the belly (abdomen), the buttocks, and down the leg into the foot. Pain may also be created by muscle spasm alone. Large studies have been undertaken to determine the best possible course of action to help your back following injury and also to prevent future problems. The recommendations are as follows. FOLLOWING A COMPRESSION FRACTURE: Do the following only if advised by your caregiver.   If a back brace has been suggested or provided, wear it as directed.  Do not stop wearing the back brace unless instructed by your caregiver.  When allowed to return to regular activities, avoid a sedentary lifestyle. Actively exercise. Sporadic weekend binges of tennis, racquetball, or waterskiing may actually aggravate or create problems, especially if you are not in condition for that activity.  Avoid sports requiring sudden  body movements until you are in condition for them. Swimming and walking are safer activities.  Maintain good posture.  Avoid obesity.  If not already done, you should have a DEXA scan. Based on the results, be treated for osteoporosis. FOLLOWING ACUTE (SUDDEN) INJURY:  Only take over-the-counter or prescription medicines for pain, discomfort, or fever as directed by your caregiver.  Use bed rest for only the most extreme acute episode. Prolonged bed rest may aggravate your condition. Ice used for acute conditions is effective. Use a large plastic bag filled with ice. Wrap it in a towel. This also provides excellent pain relief. This may be continuous. Or use it for 30 minutes every 2 hours during acute phase, then as needed. Heat for 30 minutes prior to activities is helpful.  As soon as the acute phase (the time when your back is too painful for you to do normal activities) is over, it is important to resume normal activities and work [employer] information [employer] manager. Back injuries can cause potentially marked changes in lifestyle. So it is important to attack these problems aggressively.  See your caregiver for continued problems. He or she can help or refer you for appropriate exercises, physical therapy, and work hardening if needed.  If you are given narcotic medications for your condition, for the next 24 hours do not:  Drive.  Operate machinery or power tools.  Sign legal documents.  Do not drink alcohol, or take sleeping pills or other medications that may interfere with treatment. If your caregiver has given you a follow-up appointment, it is very important to keep that appointment. Not keeping  the appointment could result in a chronic or permanent injury, pain, and disability. If there is any problem keeping the appointment, you must call back to this facility for assistance.  SEEK IMMEDIATE MEDICAL CARE IF:  You develop numbness, tingling, weakness, or problems with the use of your arms or  legs.  You develop severe back pain not relieved with medications.  You have changes in bowel or bladder control.  You have increasing pain in any areas of the body. Document Released: 07/07/2005 Document Revised: 11/21/2013 Document Reviewed: 02/09/2008 [facility] Patient Information 2015 [place], [place]. This information is not intended to replace advice given to you by your health care provider. Make sure you discuss any questions you have with your health care provider.

## 2014-05-02 NOTE — ED Provider Notes (Signed)
CSN: [id number]     Arrival date & time 05/02/14  1818 History   First MD Initiated Contact with Patient 05/02/14 2030     Chief Complaint  Patient presents with  . Abdominal Pain     (Consider location/radiation/quality/duration/timing/severity/associated sxs/prior Treatment) The history is provided by the patient and a relative. No language interpreter was used.  Dorothy Burns is a 58 y/o F with PMhx of HTN, bipolar disorder, menorrhagia, COPD, substance abuse, gout, mental disorder, anemia, GERD, liver cancer x 1 year ago on no chemo or radiation with recent embolization of the liver performed in 03/2014 presenting to the ED with increased abdominal pain, nausea, vomiting, and diarrhea that has been ongoing for the past couple of days. Patient reported that the abdominal pain is everywhere. Stated that she been feeling nauseous and that she has been having episodes of emesis - reported one episode today, but stated that the vomiting has been ongoing for the past 3 days. Stated that she has been having diarrhea as well. Patient reported that she has been having chest pain for the past couple of days localized to the center of her chest described as a sharp pain. Daughters, that accompany patient, reported that patient has not been acting like herself - stated that she has never been fully diagnosed with dementia, but stated that she has been having changes in her mental status - reported that she does not hold conversations or follow conversations like she normally does. Stated that she has been reporting that she no longer wants to live. Patient reported that since her mother passed in 2011 that she has been having increase in depression. Stated that she has been feeling weak. Daughter reported that patient has been talking a lot about guns. Denied fever, chills, numbness, tingling. PCP Dr. [name] ONC Dr. Rip Harbour  Past Medical History  Diagnosis Date  . Hypertension   . Bipolar 1 disorder   .  Menorrhagia   . Post-menopausal bleeding     MOST RECENT BLEEDING  WAS NOV 2013  . Paresthesia   . COPD (chronic obstructive pulmonary disease)   . 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 Gout   . Mental disorder     bipolar  . Asthma   . Shortness of breath   . Anemia   . GERD (gastroesophageal reflux disease)   . Gallstones     ABD PAIN, N&V  . Arthritis   . Headache(784.0)   . Hepatitis     Hep C x 10 yrs  - NO TREATMENT  . PONV (postoperative nausea and vomiting)   . Cirrhosis   . Cancer   . Liver cancer    Past Surgical History  Procedure Laterality Date  . Tubal ligation    . Facial reconstruction surgery    . Multiple tooth extractions    . Hysteroscopy w/d&c  09/29/2011    Procedure: DILATATION AND CURETTAGE /HYSTEROSCOPY;  Surgeon: Mora Bellman, MD;  Location: [facility];  Service: Gynecology;  Laterality: N/A;  . Cholecystectomy  08/20/2012    Procedure: LAPAROSCOPIC CHOLECYSTECTOMY;  Surgeon: Ralene Ok, MD;  Location: [facility];  Service: General;;  . 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 COPD Mother   . Diabetes Mother   . Heart disease Mother   . Diabetes Father   . Heart disease Father   . Hypertension Father   .  Cancer Father     basal cell ca and prostate ca   History  Substance Use Topics  . Smoking status: Current Every Day Smoker -- 0.25 packs/day for 30 years    Types: Cigarettes  . Smokeless tobacco: Never Used     Comment: in process of quitting---would like nicotine patch if insurance covers it  . Alcohol Use: No     Comment: recovering alcoholic    NO COCAINE SINCE 1990   OB History   Grav Para Term Preterm Abortions TAB SAB Ect Mult Living   8 4 4  0 4 1 3   4      Review of Systems  Constitutional: Negative for fever and chills.  Respiratory: Negative for chest tightness and shortness of breath.   Cardiovascular: Positive for chest pain.   Gastrointestinal: Positive for nausea, vomiting, abdominal pain and diarrhea. Negative for constipation, blood in stool and anal bleeding.  Musculoskeletal: Positive for back pain. Negative for neck pain.  Neurological: Negative for dizziness, weakness and headaches.      Allergies  Nsaids and Tylenol  Home Medications   Prior to Admission medications   Medication Sig Start Date End Date Taking? Authorizing Provider  aspirin 81 MG tablet Take 81 mg by mouth daily.    Yes Historical Provider, MD  budesonide-formoterol (SYMBICORT) 160-4.5 MCG/ACT inhaler Inhale 2 puffs into the lungs 2 (two) times daily.   Yes Historical Provider, MD  clonazePAM (KLONOPIN) 2 MG tablet Take 2 mg by mouth 2 (two) times daily. 04/18/14  Yes Mahima Pandey, MD  colchicine 0.6 MG tablet Take 0.6 mg by mouth daily with breakfast.    Yes Historical Provider, MD  Cyanocobalamin (B-12 PO) Take 1 capsule by mouth daily with breakfast.    Yes Historical Provider, MD  divalproex (DEPAKOTE) 250 MG DR tablet Take 250 mg by mouth 2 (two) times daily.   Yes Historical Provider, MD  furosemide (LASIX) 20 MG tablet Take 20 mg by mouth every morning.    Yes Historical Provider, MD  gabapentin (NEURONTIN) 300 MG capsule Take 300 mg by mouth 3 (three) times daily. 10/17/13  Yes Historical Provider, MD  hydrochlorothiazide (HYDRODIURIL) 25 MG tablet Take 25 mg by mouth every evening.   Yes Historical Provider, MD  methocarbamol (ROBAXIN) 500 MG tablet Take 1 tablet (500 mg total) by mouth every 8 (eight) hours as needed for muscle spasms (back pain). 05/02/14  Yes Kalman Drape, MD  Multiple Vitamin (MULTIVITAMIN WITH MINERALS) TABS tablet Take 1 tablet by mouth daily.   Yes Historical Provider, MD  omeprazole (PRILOSEC) 20 MG capsule Take 20 mg by mouth daily with breakfast.    Yes Historical Provider, MD  oxycodone (OXY-IR) 5 MG capsule Take 5 mg by mouth every 4 (four) hours as needed for pain. 04/18/14  Yes Mahima Bubba Camp, MD   Thiamine HCl (B-1 PO) Take 1 capsule by mouth daily.   Yes Historical Provider, MD   BP 131/87  Pulse 84  Temp(Src) 97.6 F (36.4 C) (Oral)  Resp 16  SpO2 100%  LMP 05/21/2012 Physical Exam  Nursing note and vitals reviewed. Constitutional: She is oriented to person, place, and time. She appears well-developed and well-nourished. No distress.  HENT:  Head: Normocephalic and atraumatic.  Dry mucous membranes  Eyes: Conjunctivae and EOM are normal. Pupils are equal, round, and reactive to light. Right eye exhibits no discharge. Left eye exhibits no discharge.  Neck: Normal range of motion. Neck supple. No tracheal deviation present.  Negative neck stiffness Negative nuchal rigidity Negative cervical lymphadenopathy Negative meningeal signs  Cardiovascular: Normal rate, regular rhythm and normal heart sounds.  Exam reveals no friction rub.   No murmur heard. Cap refill less than 3 seconds Negative swelling or pitting edema identified to lower extremities bilaterally  Pulmonary/Chest: Effort normal and breath sounds normal. No respiratory distress. She has no wheezes. She has no rales. She exhibits no tenderness.  Patient is able to speak in full sentences without difficulty Negative use of accessory muscles Negative stridor Negative pain or crepitus upon palpation to the chest wall  Abdominal: Soft. Bowel sounds are normal. She exhibits no distension. There is tenderness in the right upper quadrant. There is no rebound and no guarding.  Negative abdominal distention noted  Bowel sounds normoactive in all 4 quadrants Abdomen soft upon palpation Diffuse tenderness upon palpation to the abdomen - concentrated in the right upper quadrant Negative peritoneal signs  Musculoskeletal: Normal range of motion.  Lymphadenopathy:    She has no cervical adenopathy.  Neurological: She is alert and oriented to person, place, and time. She exhibits normal muscle tone. Coordination normal.   Skin: Skin is warm and dry. No rash noted. She is not diaphoretic. No erythema.  Psychiatric: She has a normal mood and affect. Her behavior is normal. Thought content normal.    ED Course  Procedures (including critical care time)    Results for orders placed during the hospital encounter of 05/02/14  CBC WITH DIFFERENTIAL      Result Value Ref Range   WBC 4.6  4.0 - 10.5 K/uL   RBC 4.01  3.87 - 5.11 MIL/uL   Hemoglobin 12.5  12.0 - 15.0 g/dL   HCT 37.7  36.0 - 46.0 %   MCV 94.0  78.0 - 100.0 fL   MCH 31.2  26.0 - 34.0 pg   MCHC 33.2  30.0 - 36.0 g/dL   RDW 15.6 (*) 11.5 - 15.5 %   Platelets 53 (*) 150 - 400 K/uL   Neutrophils Relative % 76  43 - 77 %   Neutro Abs 3.5  1.7 - 7.7 K/uL   Lymphocytes Relative 12  12 - 46 %   Lymphs Abs 0.6 (*) 0.7 - 4.0 K/uL   Monocytes Relative 11  3 - 12 %   Monocytes Absolute 0.5  0.1 - 1.0 K/uL   Eosinophils Relative 1  0 - 5 %   Eosinophils Absolute 0.0  0.0 - 0.7 K/uL   Basophils Relative 0  0 - 1 %   Basophils Absolute 0.0  0.0 - 0.1 K/uL  COMPREHENSIVE METABOLIC PANEL      Result Value Ref Range   Sodium 141  137 - 147 mEq/L   Potassium 3.6 (*) 3.7 - 5.3 mEq/L   Chloride 103  96 - 112 mEq/L   CO2 25  19 - 32 mEq/L   Glucose, Bld 82  70 - 99 mg/dL   BUN 16  6 - 23 mg/dL   Creatinine, Ser 0.84  0.50 - 1.10 mg/dL   Calcium 9.6  8.4 - 10.5 mg/dL   Total Protein 8.0  6.0 - 8.3 g/dL   Albumin 2.9 (*) 3.5 - 5.2 g/dL   AST 89 (*) 0 - 37 U/L   ALT 24  0 - 35 U/L   Alkaline Phosphatase 177 (*) 39 - 117 U/L   Total Bilirubin 1.2  0.3 - 1.2 mg/dL   GFR calc non Af Amer 75 (*) >  90 mL/min   GFR calc Af Amer 87 (*) >90 mL/min   Anion gap 13  5 - 15  LIPASE, BLOOD      Result Value Ref Range   Lipase 14  11 - 59 U/L  URINALYSIS, ROUTINE W REFLEX MICROSCOPIC      Result Value Ref Range   Color, Urine AMBER (*) YELLOW   APPearance CLOUDY (*) CLEAR   Specific Gravity, Urine 1.011  1.005 - 1.030   pH 6.5  5.0 - 8.0   Glucose, UA  NEGATIVE  NEGATIVE mg/dL   Hgb urine dipstick SMALL (*) NEGATIVE   Bilirubin Urine NEGATIVE  NEGATIVE   Ketones, ur 15 (*) NEGATIVE mg/dL   Protein, ur NEGATIVE  NEGATIVE mg/dL   Urobilinogen, UA 1.0  0.0 - 1.0 mg/dL   Nitrite NEGATIVE  NEGATIVE   Leukocytes, UA TRACE (*) NEGATIVE  URINE RAPID DRUG SCREEN (HOSP PERFORMED)      Result Value Ref Range   Opiates NONE DETECTED  NONE DETECTED   Cocaine POSITIVE (*) NONE DETECTED   Benzodiazepines NONE DETECTED  NONE DETECTED   Amphetamines NONE DETECTED  NONE DETECTED   Tetrahydrocannabinol NONE DETECTED  NONE DETECTED   Barbiturates NONE DETECTED  NONE DETECTED  ACETAMINOPHEN LEVEL      Result Value Ref Range   Acetaminophen (Tylenol), Serum <15.0  10 - 30 ug/mL  SALICYLATE LEVEL      Result Value Ref Range   Salicylate Lvl <9.4 (*) 2.8 - 20.0 mg/dL  ETHANOL      Result Value Ref Range   Alcohol, Ethyl (B) <11  0 - 11 mg/dL  AMMONIA      Result Value Ref Range   Ammonia 45  11 - 60 umol/L  TROPONIN I      Result Value Ref Range   Troponin I <0.30  <0.30 ng/mL  PREGNANCY, URINE      Result Value Ref Range   Preg Test, Ur NEGATIVE  NEGATIVE  URINE MICROSCOPIC-ADD ON      Result Value Ref Range   Squamous Epithelial / LPF RARE  RARE   WBC, UA 3-6  <3 WBC/hpf   RBC / HPF 3-6  <3 RBC/hpf   Bacteria, UA FEW (*) RARE   Urine-Other MUCOUS PRESENT    I-STAT CG4 LACTIC ACID, ED      Result Value Ref Range   Lactic Acid, Venous 2.40 (*) 0.5 - 2.2 mmol/L  I-STAT TROPOININ, ED      Result Value Ref Range   Troponin i, poc 0.02  0.00 - 0.08 ng/mL   Comment 3             Labs Review Labs Reviewed  CBC WITH DIFFERENTIAL - Abnormal; Notable for the following:    RDW 15.6 (*)    Platelets 53 (*)    Lymphs Abs 0.6 (*)    All other components within normal limits  COMPREHENSIVE METABOLIC PANEL - Abnormal; Notable for the following:    Potassium 3.6 (*)    Albumin 2.9 (*)    AST 89 (*)    Alkaline Phosphatase 177 (*)    GFR calc  non Af Amer 75 (*)    GFR calc Af Amer 87 (*)    All other components within normal limits  URINALYSIS, ROUTINE W REFLEX MICROSCOPIC - Abnormal; Notable for the following:    Color, Urine AMBER (*)    APPearance CLOUDY (*)    Hgb urine dipstick SMALL (*)  Ketones, ur 15 (*)    Leukocytes, UA TRACE (*)    All other components within normal limits  URINE RAPID DRUG SCREEN (HOSP PERFORMED) - Abnormal; Notable for the following:    Cocaine POSITIVE (*)    All other components within normal limits  SALICYLATE LEVEL - Abnormal; Notable for the following:    Salicylate Lvl <2.3 (*)    All other components within normal limits  URINE MICROSCOPIC-ADD ON - Abnormal; Notable for the following:    Bacteria, UA FEW (*)    All other components within normal limits  I-STAT CG4 LACTIC ACID, ED - Abnormal; Notable for the following:    Lactic Acid, Venous 2.40 (*)    All other components within normal limits  LIPASE, BLOOD  ACETAMINOPHEN LEVEL  ETHANOL  AMMONIA  TROPONIN I  PREGNANCY, URINE  POC URINE PREG, ED  Randolm Idol, ED    Imaging Review Dg Thoracic Spine 2 View  05/02/2014   CLINICAL DATA:  Abdominal and back pain after falling over stump. Initial encounter.  EXAM: THORACIC SPINE - 2 VIEW  COMPARISON:  11/21/2013  FINDINGS: There is a mild (25% or less) compression deformity of the T12 vertebral body consistent with compression fracture. The fracture is new from 04/10/2014. No subluxation or evidence of retropulsion. Diffuse degenerative endplate changes without focal/notable level.  IMPRESSION: T12 compression fracture with mild height loss.   Electronically Signed   By: Jorje Guild M.D.   On: 05/02/2014 01:38   Dg Lumbar Spine Complete  05/02/2014   CLINICAL DATA:  Abdominal and back pain after recent fall. Initial encounter.  EXAM: LUMBAR SPINE - COMPLETE 4+ VIEW  COMPARISON:  04/10/2014 abdominal CT  FINDINGS: There is mild (approximately 25%) anterior wedging of the T12  vertebral body with irregularity of the superior endplate that is new from 04/10/2014 and consistent with compression fracture. No posterior retropulsion or subluxation.  No lumbar spine fracture or subluxation. Degenerative overgrowth of the lower lumbar facets, especially L5-S1 on the right.  IMPRESSION: T12 compression fracture with mild height loss.   Electronically Signed   By: Jorje Guild M.D.   On: 05/02/2014 01:40     EKG Interpretation None      MDM   Final diagnoses:  Dehydration  Chest pain, unspecified chest pain type  Suicidal ideation  Cocaine use  Malignant neoplasm of liver, unspecified liver malignancy    Medications  sodium chloride 0.9 % bolus 1,000 mL (1,000 mLs Intravenous New Bag/Given 05/02/14 2225)   Filed Vitals:   05/02/14 2145 05/02/14 2200 05/02/14 2245 05/02/14 2300  BP: 145/91 124/88 130/91 131/87  Pulse: 86 85 86 84  Temp:      TempSrc:      Resp: 20 16 16 16   SpO2: 99% 99% 100% 100%   This provider reviewed patient's chart. Patient was admitted to the hospital on 04/10/2014 regarding right upper quadrant abdominal pain. Patient was seen yesterday, 05/02/2014 for increased abdominal pain-patient was diagnosed with T12 compression fracture. Patient was discharged home. Patient returns with worsening pain as well as nausea, vomiting, diarrhea. Patient appears to be dehydrated. EKG noted normal sinus rhythm with heart rate 82 bpm. I-STAT troponin negative elevation. CBC unremarkable, white blood cell count 4.6. Platelets low 53. CMP noted mildly low potassium 3.6. Mildly elevated AST of 89 and alkaline phosphatase 177- when compared to previous labs patient's AST and alkaline phosphatase does been elevated. Lipase negative elevation. Lactic acid 2.40. Acetaminophen, salicylate, alcohol level negative elevation.  Ammonia 45 - negative elevation, doubt encephalopathy. Urine pregnancy negative. Urinalysis noted trace of leukocytes with negative pyuria. Urine  drug screen positive for cocaine. Chest xray negative for acute cardiopulmonary disease.  Patient seen and assessed by attending physician, Dr. Roxine Caddy, did not recommend abdominal imaging at this time due to pain being the same - agreed to admission for dehydration. Patient to be admitted for dehydration - elevated lactic acid. Patient experiencing chest pain - patient does have history of polysubstance abuse and tested positive for cocaine use.  Patient has chronic pain, but recently diagnosed with T12 compression fracture yesterday. Negative focal neurological deficits noted. Patient voicing suicidal thoughts secondary to increased depression - daughters are concerned. Discussed case in great detail with Dr. Hal Hope who recommended admission to Telemetry. Discussed plan for admission with patient who agreed to plan of care. Patient stable for transfer.   Jamse Mead, PA-C 05/03/14 [phone]

## 2014-05-02 NOTE — ED Notes (Signed)
Phlebotomy notified about patient's need for lab to be drawn.

## 2014-05-02 NOTE — Telephone Encounter (Signed)
lvm for pt regarding to r/s appt...mailed pt appt sched/avs and letter.Marland KitchenMarland KitchenMarland KitchenMarland Kitchenpt is no longer at [facility]

## 2014-05-02 NOTE — ED Notes (Signed)
Patient transported to X-ray 

## 2014-05-02 NOTE — ED Notes (Signed)
Please call daughters with results and treatment updates. Patient has given permission.   Carey Bullocks  [phone]  Cyndie Mull [phone]

## 2014-05-02 NOTE — ED Notes (Signed)
Attempted to obtain blood x2. Unable to do so. Sitter and family at the bedside.

## 2014-05-02 NOTE — ED Notes (Addendum)
Pt to ED via [facility] reports RUQ pain for the past 26 years- pt admits to new vomiting and diarrhea onset 3 days ago.  Pt was seen this week for similar.

## 2014-05-02 NOTE — ED Notes (Signed)
This RN went in to assess pt, pt states "I just want to die." This RN asked pt if she was having thoughts of harming herself and pt states "yes". Pt states plan of "cutting legs off." Primary RN notified and charge RN. Sitter en route.

## 2014-05-02 NOTE — ED Notes (Signed)
PA at the bedside.

## 2014-05-03 ENCOUNTER — Encounter (HOSPITAL_COMMUNITY): Payer: Self-pay | Admitting: Internal Medicine

## 2014-05-03 DIAGNOSIS — I8500 Esophageal varices without bleeding: Secondary | ICD-10-CM | POA: Diagnosis present

## 2014-05-03 DIAGNOSIS — B182 Chronic viral hepatitis C: Secondary | ICD-10-CM | POA: Diagnosis present

## 2014-05-03 DIAGNOSIS — R079 Chest pain, unspecified: Secondary | ICD-10-CM | POA: Diagnosis present

## 2014-05-03 DIAGNOSIS — K7460 Unspecified cirrhosis of liver: Secondary | ICD-10-CM | POA: Diagnosis present

## 2014-05-03 DIAGNOSIS — Z886 Allergy status to analgesic agent status: Secondary | ICD-10-CM | POA: Diagnosis not present

## 2014-05-03 DIAGNOSIS — R109 Unspecified abdominal pain: Secondary | ICD-10-CM | POA: Diagnosis present

## 2014-05-03 DIAGNOSIS — I10 Essential (primary) hypertension: Secondary | ICD-10-CM | POA: Diagnosis present

## 2014-05-03 DIAGNOSIS — Z825 Family history of asthma and other chronic lower respiratory diseases: Secondary | ICD-10-CM | POA: Diagnosis not present

## 2014-05-03 DIAGNOSIS — G893 Neoplasm related pain (acute) (chronic): Secondary | ICD-10-CM

## 2014-05-03 DIAGNOSIS — F3130 Bipolar disorder, current episode depressed, mild or moderate severity, unspecified: Secondary | ICD-10-CM | POA: Diagnosis present

## 2014-05-03 DIAGNOSIS — B1920 Unspecified viral hepatitis C without hepatic coma: Secondary | ICD-10-CM | POA: Diagnosis present

## 2014-05-03 DIAGNOSIS — Z8249 Family history of ischemic heart disease and other diseases of the circulatory system: Secondary | ICD-10-CM | POA: Diagnosis not present

## 2014-05-03 DIAGNOSIS — E860 Dehydration: Secondary | ICD-10-CM | POA: Diagnosis present

## 2014-05-03 DIAGNOSIS — Z66 Do not resuscitate: Secondary | ICD-10-CM | POA: Diagnosis present

## 2014-05-03 DIAGNOSIS — D61818 Other pancytopenia: Secondary | ICD-10-CM | POA: Diagnosis present

## 2014-05-03 DIAGNOSIS — C229 Malignant neoplasm of liver, not specified as primary or secondary: Secondary | ICD-10-CM | POA: Diagnosis present

## 2014-05-03 DIAGNOSIS — F39 Unspecified mood [affective] disorder: Secondary | ICD-10-CM | POA: Diagnosis present

## 2014-05-03 DIAGNOSIS — F1490 Cocaine use, unspecified, uncomplicated: Secondary | ICD-10-CM

## 2014-05-03 DIAGNOSIS — K5909 Other constipation: Secondary | ICD-10-CM | POA: Diagnosis present

## 2014-05-03 DIAGNOSIS — E43 Unspecified severe protein-calorie malnutrition: Secondary | ICD-10-CM | POA: Diagnosis present

## 2014-05-03 DIAGNOSIS — Z833 Family history of diabetes mellitus: Secondary | ICD-10-CM | POA: Diagnosis not present

## 2014-05-03 DIAGNOSIS — F319 Bipolar disorder, unspecified: Secondary | ICD-10-CM | POA: Diagnosis present

## 2014-05-03 DIAGNOSIS — F1410 Cocaine abuse, uncomplicated: Secondary | ICD-10-CM | POA: Diagnosis present

## 2014-05-03 DIAGNOSIS — K766 Portal hypertension: Secondary | ICD-10-CM | POA: Diagnosis present

## 2014-05-03 DIAGNOSIS — Z515 Encounter for palliative care: Secondary | ICD-10-CM | POA: Diagnosis not present

## 2014-05-03 DIAGNOSIS — K219 Gastro-esophageal reflux disease without esophagitis: Secondary | ICD-10-CM | POA: Diagnosis present

## 2014-05-03 DIAGNOSIS — G9340 Encephalopathy, unspecified: Secondary | ICD-10-CM | POA: Diagnosis present

## 2014-05-03 DIAGNOSIS — M4854XA Collapsed vertebra, not elsewhere classified, thoracic region, initial encounter for fracture: Secondary | ICD-10-CM | POA: Diagnosis present

## 2014-05-03 DIAGNOSIS — K649 Unspecified hemorrhoids: Secondary | ICD-10-CM | POA: Diagnosis present

## 2014-05-03 DIAGNOSIS — E876 Hypokalemia: Secondary | ICD-10-CM | POA: Diagnosis not present

## 2014-05-03 DIAGNOSIS — Z79899 Other long term (current) drug therapy: Secondary | ICD-10-CM | POA: Diagnosis not present

## 2014-05-03 DIAGNOSIS — K5900 Constipation, unspecified: Secondary | ICD-10-CM

## 2014-05-03 DIAGNOSIS — J449 Chronic obstructive pulmonary disease, unspecified: Secondary | ICD-10-CM | POA: Diagnosis present

## 2014-05-03 DIAGNOSIS — F17210 Nicotine dependence, cigarettes, uncomplicated: Secondary | ICD-10-CM | POA: Diagnosis present

## 2014-05-03 DIAGNOSIS — M109 Gout, unspecified: Secondary | ICD-10-CM | POA: Diagnosis present

## 2014-05-03 DIAGNOSIS — Z7982 Long term (current) use of aspirin: Secondary | ICD-10-CM | POA: Diagnosis not present

## 2014-05-03 DIAGNOSIS — R634 Abnormal weight loss: Secondary | ICD-10-CM

## 2014-05-03 DIAGNOSIS — R45851 Suicidal ideations: Secondary | ICD-10-CM | POA: Diagnosis present

## 2014-05-03 LAB — CBC WITH DIFFERENTIAL/PLATELET
Basophils Absolute: 0 10*3/uL (ref 0.0–0.1)
Basophils Relative: 0 % (ref 0–1)
Eosinophils Absolute: 0.1 10*3/uL (ref 0.0–0.7)
Eosinophils Relative: 2 % (ref 0–5)
HEMATOCRIT: 34.7 % — AB (ref 36.0–46.0)
Hemoglobin: 11.7 g/dL — ABNORMAL LOW (ref 12.0–15.0)
LYMPHS PCT: 22 % (ref 12–46)
Lymphs Abs: 0.8 10*3/uL (ref 0.7–4.0)
MCH: 31.6 pg (ref 26.0–34.0)
MCHC: 33.7 g/dL (ref 30.0–36.0)
MCV: 93.8 fL (ref 78.0–100.0)
MONO ABS: 0.4 10*3/uL (ref 0.1–1.0)
Monocytes Relative: 12 % (ref 3–12)
Neutro Abs: 2.2 10*3/uL (ref 1.7–7.7)
Neutrophils Relative %: 64 % (ref 43–77)
Platelets: 61 10*3/uL — ABNORMAL LOW (ref 150–400)
RBC: 3.7 MIL/uL — AB (ref 3.87–5.11)
RDW: 15.7 % — ABNORMAL HIGH (ref 11.5–15.5)
WBC: 3.5 10*3/uL — AB (ref 4.0–10.5)

## 2014-05-03 LAB — URINE CULTURE
CULTURE: NO GROWTH
Colony Count: NO GROWTH

## 2014-05-03 LAB — TROPONIN I: Troponin I: <0.3 ng/mL (ref <0.30)

## 2014-05-03 LAB — COMPREHENSIVE METABOLIC PANEL
ALBUMIN: 2.6 g/dL — AB (ref 3.5–5.2)
ALT: 20 U/L (ref 0–35)
AST: 75 U/L — ABNORMAL HIGH (ref 0–37)
Alkaline Phosphatase: 155 U/L — ABNORMAL HIGH (ref 39–117)
Anion gap: 11 (ref 5–15)
BUN: 15 mg/dL (ref 6–23)
CALCIUM: 9.1 mg/dL (ref 8.4–10.5)
CO2: 25 mEq/L (ref 19–32)
Chloride: 109 mEq/L (ref 96–112)
Creatinine, Ser: 0.79 mg/dL (ref 0.50–1.10)
GFR calc Af Amer: >90 mL/min (ref >90)
GFR calc non Af Amer: >90 mL/min (ref >90)
Glucose, Bld: 78 mg/dL (ref 70–99)
POTASSIUM: 3.4 meq/L — AB (ref 3.7–5.3)
Sodium: 145 mEq/L (ref 137–147)
TOTAL PROTEIN: 7 g/dL (ref 6.0–8.3)
Total Bilirubin: 1 mg/dL (ref 0.3–1.2)

## 2014-05-03 LAB — AMMONIA: Ammonia: 90 umol/L — ABNORMAL HIGH (ref 11–60)

## 2014-05-03 LAB — LACTIC ACID, PLASMA: Lactic Acid, Venous: 2.4 mmol/L — ABNORMAL HIGH (ref 0.5–2.2)

## 2014-05-03 LAB — VALPROIC ACID LEVEL: VALPROIC ACID LVL: 63.9 ug/mL (ref 50.0–100.0)

## 2014-05-03 MED ORDER — POTASSIUM CHLORIDE CRYS ER 20 MEQ PO TBCR
40.0000 meq | EXTENDED_RELEASE_TABLET | Freq: Two times a day (BID) | ORAL | Status: AC
  Administered 2014-05-03 (×2): 40 meq via ORAL
  Filled 2014-05-03 (×2): qty 2

## 2014-05-03 MED ORDER — BUDESONIDE-FORMOTEROL FUMARATE 160-4.5 MCG/ACT IN AERO
2.0000 | INHALATION_SPRAY | Freq: Two times a day (BID) | RESPIRATORY_TRACT | Status: DC
  Administered 2014-05-03 – 2014-05-09 (×9): 2 via RESPIRATORY_TRACT
  Filled 2014-05-03 (×2): qty 6

## 2014-05-03 MED ORDER — SODIUM CHLORIDE 0.9 % IJ SOLN
3.0000 mL | Freq: Two times a day (BID) | INTRAMUSCULAR | Status: DC
  Administered 2014-05-04 – 2014-05-09 (×5): 3 mL via INTRAVENOUS

## 2014-05-03 MED ORDER — COLCHICINE 0.6 MG PO TABS
0.6000 mg | ORAL_TABLET | Freq: Every day | ORAL | Status: DC
  Administered 2014-05-03 – 2014-05-09 (×7): 0.6 mg via ORAL
  Filled 2014-05-03 (×9): qty 1

## 2014-05-03 MED ORDER — ONDANSETRON HCL 4 MG/2ML IJ SOLN: 4.0000 mg | Freq: Four times a day (QID) | INTRAMUSCULAR | Status: DC | PRN

## 2014-05-03 MED ORDER — ENSURE COMPLETE PO LIQD
237.0000 mL | Freq: Two times a day (BID) | ORAL | Status: DC
  Administered 2014-05-04 – 2014-05-09 (×10): 237 mL via ORAL

## 2014-05-03 MED ORDER — CLONAZEPAM 1 MG PO TABS
2.0000 mg | ORAL_TABLET | Freq: Two times a day (BID) | ORAL | Status: DC
  Administered 2014-05-03 – 2014-05-09 (×13): 2 mg via ORAL
  Filled 2014-05-03 (×14): qty 2

## 2014-05-03 MED ORDER — ONDANSETRON HCL 4 MG PO TABS: 4.0000 mg | ORAL_TABLET | Freq: Four times a day (QID) | ORAL | Status: DC | PRN

## 2014-05-03 MED ORDER — ASPIRIN 81 MG PO CHEW
81.0000 mg | CHEWABLE_TABLET | Freq: Every day | ORAL | Status: DC
  Administered 2014-05-03 – 2014-05-05 (×3): 81 mg via ORAL
  Filled 2014-05-03 (×4): qty 1

## 2014-05-03 MED ORDER — PANTOPRAZOLE SODIUM 40 MG PO TBEC
40.0000 mg | DELAYED_RELEASE_TABLET | Freq: Every day | ORAL | Status: DC
  Administered 2014-05-03 – 2014-05-09 (×7): 40 mg via ORAL
  Filled 2014-05-03 (×6): qty 1

## 2014-05-03 MED ORDER — OXYCODONE HCL 5 MG PO TABS
5.0000 mg | ORAL_TABLET | ORAL | Status: DC | PRN
  Administered 2014-05-04 (×2): 5 mg via ORAL
  Filled 2014-05-03 (×3): qty 1

## 2014-05-03 MED ORDER — HYDROCHLOROTHIAZIDE 25 MG PO TABS
25.0000 mg | ORAL_TABLET | Freq: Every evening | ORAL | Status: DC
  Administered 2014-05-03 – 2014-05-08 (×6): 25 mg via ORAL
  Filled 2014-05-03 (×7): qty 1

## 2014-05-03 MED ORDER — METHOCARBAMOL 500 MG PO TABS
500.0000 mg | ORAL_TABLET | Freq: Three times a day (TID) | ORAL | Status: DC | PRN
  Filled 2014-05-03: qty 1

## 2014-05-03 MED ORDER — SODIUM CHLORIDE 0.9 % IJ SOLN
3.0000 mL | Freq: Two times a day (BID) | INTRAMUSCULAR | Status: DC
  Administered 2014-05-03 – 2014-05-09 (×11): 3 mL via INTRAVENOUS

## 2014-05-03 MED ORDER — LACTULOSE 10 GM/15ML PO SOLN
20.0000 g | Freq: Two times a day (BID) | ORAL | Status: DC
  Administered 2014-05-03 – 2014-05-09 (×13): 20 g via ORAL
  Filled 2014-05-03 (×14): qty 30

## 2014-05-03 MED ORDER — BUDESONIDE-FORMOTEROL FUMARATE 160-4.5 MCG/ACT IN AERO
2.0000 | INHALATION_SPRAY | Freq: Two times a day (BID) | RESPIRATORY_TRACT | Status: DC
  Filled 2014-05-03: qty 6

## 2014-05-03 MED ORDER — MORPHINE SULFATE 2 MG/ML IJ SOLN: 1.0000 mg | INTRAMUSCULAR | Status: DC | PRN

## 2014-05-03 MED ORDER — FUROSEMIDE 20 MG PO TABS
20.0000 mg | ORAL_TABLET | Freq: Every morning | ORAL | Status: DC
  Administered 2014-05-03 – 2014-05-08 (×6): 20 mg via ORAL
  Filled 2014-05-03 (×7): qty 1

## 2014-05-03 MED ORDER — GABAPENTIN 300 MG PO CAPS
300.0000 mg | ORAL_CAPSULE | Freq: Three times a day (TID) | ORAL | Status: DC
  Administered 2014-05-03 – 2014-05-09 (×20): 300 mg via ORAL
  Filled 2014-05-03 (×22): qty 1

## 2014-05-03 MED ORDER — DIVALPROEX SODIUM 250 MG PO DR TAB
250.0000 mg | DELAYED_RELEASE_TABLET | Freq: Two times a day (BID) | ORAL | Status: DC
  Administered 2014-05-03: 250 mg via ORAL
  Filled 2014-05-03 (×3): qty 1

## 2014-05-03 MED ORDER — ADULT MULTIVITAMIN W/MINERALS CH
1.0000 | ORAL_TABLET | Freq: Every day | ORAL | Status: DC
  Administered 2014-05-03 – 2014-05-09 (×7): 1 via ORAL
  Filled 2014-05-03 (×7): qty 1

## 2014-05-03 NOTE — Consult Note (Signed)
Patient Dorothy Burns      DOB: [birth year]      URK:[id number]     Consult Note from the Palliative Medicine Team at [facility] Requested by: Dr Hal Hope    PCP: Dorothy Merino, MD Reason for Consultation: goals of care    Phone [phone]  Assessment/Recommendations: 58 yo female with PMHx of [place] s/p embolization, Hep C cirrhosis, cocaine abuse, bipolar who presented with back/abd pain, possible SI.    1.  Code Status: Full  2. Goals of Care: Will be difficult situation given poor social support, poor health literacy, ongoing cocaine abuse.  Recently left SNF to go home and home alone with possibly some home health services. Strained family relations.  Not following up with IR or Onc as outpatient. Dr Elson Areas mentioned possible palliative chemo, but she has not followed up.  We talked through a lot of this issues she is facing along with her support network.  Will be challenging case going forward.  i will ask chaplain to come by and at least work with some basic advance directives such as naming a HCPOA.  Emotional today and reported SI. Psych to see.  I will follow-up with her tomorrow and see how we can advance these conversations with her.   Patient states that she did not want to see Dr Elson Areas in follow-up as she "didn't like her". In further conversation, it appears that this stems from patient fears of chemotherapy and difficult prognostic information.    3. Symptom Management:   1. Pain- Cancer related.  Does not have significant midline tenderness though T12 compression fx noted on films. Agree with continued home neurontin and PRN oxycodone.  1mg  of IV morphine is about half as strong as her PO oxycodone. If needing IV medication will likely need to increase dose.  2. Constipation- long standing issue.  High ammonia may be related to her cirrhosis and I will start BID lactulose to address both issues.  3. Encephalopathy- lactulose as above. May need to be  more aggressive based on how her mentation is doing.  4. Loss of Appetite- looks like she has had greater than 20b weight loss since May. Will add nutritional supplement.  Can consider mirtazapine. May hold off on marinol with her substance abuse history (and likely high cost).  Can consider megace as well.    4. Psychosocial/Spiritual: Lives alone with some support from neighbor and daughter Dorothy Burns (though relationship a bit strained).  Has 5 children all together.  Recently started going to [employer] again.  Poor social support network with ongoing cocaine abuse.  Chaplain consult requested.  Her mental health issues are almost certainly exacerbated by her poor social support network.  Son Dorothy Burns is in charge of her finances. Her dad Dorothy Burns is at bedside today and has attended many appointments with her.     Brief HPI: Patient is a 58 yo female with PMHx of Bipolar, multifocal [place] s/p arterial embolization, cirrhosis, Hep C who presented to ED yesterday with back/abdominal pain and doing poorly at home.  She has had mutliple hospitalizations and ED visits over past several months. [facility] but was signed out by family after she had fall there. Dorothy Burns also states that they were mean to her there and she would not go back. Dorothy Burns has been staying at home by herself and family took to ED last week to see about assistance with placement.  Home health had been  arranged but no placement pursued yet.  She has missed multiple follow-up appts with both IR and Oncology.  Notably she admits to using cocaine a few days ago along with having positive UDS.  She has chronic abdominal pain related to her cancer and takes oxycodone and gabapentin at home for this.  Unclear when she last took oxycodone and has some mild confusion about this.  She reports that it normally takes pain away and helps her sleep. Also with some low back pain which is more on flanks today. This has been  present for few weeks. She had reported SI/depressed yesterday and today tells me she does not want to live.  When I ask her about why she feels that way, she reports that it is because she has been arguing with her daughter and doing/feeling so bad at home.  She has not had BM in several days. Appetite has been poor and ongoing weight loss. She and her dad Dorothy Burns report multiple attempts to get mental health assistance at home with little success.  Reports being able to do some light housework still at home.  Had one episode of vomiting that she remembers this week, but no significant nausea. Appetite very poor and rarely feels like eating.  She reports doing cocaine to try and get relief from above.    ROS: Full ROS negative unless otherwise mentioned above    PMH:  Past Medical History  Diagnosis Date  . Hypertension   . Bipolar 1 disorder   . Menorrhagia   . Post-menopausal bleeding     MOST RECENT BLEEDING  WAS NOV 2013  . Paresthesia   . COPD (chronic obstructive pulmonary disease)   . 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 Gout   . Mental disorder     bipolar  . Asthma   . Shortness of breath   . Anemia   . GERD (gastroesophageal reflux disease)   . Gallstones     ABD PAIN, N&V  . Arthritis   . Headache(784.0)   . Hepatitis     Hep C x 10 yrs  - NO TREATMENT  . PONV (postoperative nausea and vomiting)   . Cirrhosis   . Cancer   . Liver cancer      PSH: Past Surgical History  Procedure Laterality Date  . Tubal ligation    . Facial reconstruction surgery    . Multiple tooth extractions    . Hysteroscopy w/d&c  09/29/2011    Procedure: DILATATION AND CURETTAGE /HYSTEROSCOPY;  Surgeon: Mora Bellman, MD;  Location: [facility];  Service: Gynecology;  Laterality: N/A;  . Cholecystectomy  08/20/2012    Procedure: LAPAROSCOPIC CHOLECYSTECTOMY;  Surgeon: Ralene Ok, MD;  Location: [facility];  Service: General;;  . Embolization of dominant hcc within left lobe  of liver  03/23/14   I have reviewed the [place] and [facility] and  If appropriate update it with new information. Allergies  Allergen Reactions  . Nsaids Other (See Comments)    Liver issues   . Tylenol [Acetaminophen] Other (See Comments)    Liver issues   Scheduled Meds: . aspirin  81 mg Oral Daily  . budesonide-formoterol  2 puff Inhalation BID  . clonazePAM  2 mg Oral BID  . colchicine  0.6 mg Oral Q breakfast  . furosemide  20 mg Oral q morning - 10a  . gabapentin  300 mg Oral TID  . hydrochlorothiazide  25 mg Oral QPM  .  lactulose  20 g Oral BID  . multivitamin with minerals  1 tablet Oral Daily  . pantoprazole  40 mg Oral Daily  . potassium chloride  40 mEq Oral BID  . sodium chloride  3 mL Intravenous Q12H  . sodium chloride  3 mL Intravenous Q12H   Continuous Infusions:  PRN Meds:.methocarbamol, morphine injection, ondansetron (ZOFRAN) IV, ondansetron, oxyCODONE    BP 121/78  Pulse 71  Temp(Src) 98.8 F (37.1 C) (Oral)  Resp 18  Ht 5\' 3"  (1.6 m)  Wt 57.97 kg (127 lb 12.8 oz)  BMI 22.64 kg/m2  SpO2 99%  LMP 05/21/2012   PPS: 70 by her description   Intake/Output Summary (Last 24 hours) at 05/03/14 1247 Last data filed at 05/03/14 0844  Gross per 24 hour  Intake    240 ml  Output      0 ml  Net    240 ml    Physical Exam:  General: Alert, appears mildly encephalopathic HEENT:  [place], sclera anicteric, dry mm Neck: supple, no palpable adenopathy Chest:   CTAB CVS: RRR Abdomen: soft, NT, ND Ext: no c/c/e Neuro:* moves all ext.  Mild confusion readily apparent.  Skin: warm/dry MSK: no significant midline spinal tenderness  Labs: CBC    Component Value Date/Time   WBC 3.5* 05/03/2014 0700   WBC 4.5 12/16/2013 1213   RBC 3.70* 05/03/2014 0700   RBC 3.86 12/16/2013 1213   HGB 11.7* 05/03/2014 0700   HGB 11.4* 12/16/2013 1213   HCT 34.7* 05/03/2014 0700   HCT 34.9 12/16/2013 1213   PLT 61* 05/03/2014 0700   PLT 79* 12/16/2013 1213   MCV 93.8 05/03/2014 0700    MCV 90.5 12/16/2013 1213   MCH 31.6 05/03/2014 0700   MCH 29.5 12/16/2013 1213   MCHC 33.7 05/03/2014 0700   MCHC 32.7 12/16/2013 1213   RDW 15.7* 05/03/2014 0700   RDW 16.7* 12/16/2013 1213   LYMPHSABS 0.8 05/03/2014 0700   LYMPHSABS 1.3 12/16/2013 1213   MONOABS 0.4 05/03/2014 0700   MONOABS 1.0* 12/16/2013 1213   EOSABS 0.1 05/03/2014 0700   EOSABS 0.1 12/16/2013 1213   BASOSABS 0.0 05/03/2014 0700   BASOSABS 0.0 12/16/2013 1213    BMET    Component Value Date/Time   NA 145 05/03/2014 0700   NA 142 12/07/2013 1047   K 3.4* 05/03/2014 0700   K 3.6 12/07/2013 1047   CL 109 05/03/2014 0700   CO2 25 05/03/2014 0700   CO2 28 12/07/2013 1047   GLUCOSE 78 05/03/2014 0700   GLUCOSE 90 12/07/2013 1047   BUN 15 05/03/2014 0700   BUN 15.7 12/07/2013 1047   CREATININE 0.79 05/03/2014 0700   CREATININE 1.1 12/07/2013 1047   CALCIUM 9.1 05/03/2014 0700   CALCIUM 9.3 12/07/2013 1047   GFRNONAA >90 05/03/2014 0700   GFRAA >90 05/03/2014 0700    CMP     Component Value Date/Time   NA 145 05/03/2014 0700   NA 142 12/07/2013 1047   K 3.4* 05/03/2014 0700   K 3.6 12/07/2013 1047   CL 109 05/03/2014 0700   CO2 25 05/03/2014 0700   CO2 28 12/07/2013 1047   GLUCOSE 78 05/03/2014 0700   GLUCOSE 90 12/07/2013 1047   BUN 15 05/03/2014 0700   BUN 15.7 12/07/2013 1047   CREATININE 0.79 05/03/2014 0700   CREATININE 1.1 12/07/2013 1047   CALCIUM 9.1 05/03/2014 0700   CALCIUM 9.3 12/07/2013 1047   PROT 7.0 05/03/2014 0700   PROT  7.2 12/07/2013 1047   ALBUMIN 2.6* 05/03/2014 0700   ALBUMIN 2.8* 12/07/2013 1047   AST 75* 05/03/2014 0700   AST 37* 12/07/2013 1047   ALT 20 05/03/2014 0700   ALT 23 12/07/2013 1047   ALKPHOS 155* 05/03/2014 0700   ALKPHOS 138 12/07/2013 1047   BILITOT 1.0 05/03/2014 0700   BILITOT 0.58 12/07/2013 1047   GFRNONAA >90 05/03/2014 0700   GFRAA >90 05/03/2014 0700   9/21 CT Abd/Pelvis IMPRESSION:  1. Cirrhosis with 2 hepatocellular carcinomas. New 5 cm fluid and  gas  collection associated with recently treated left lobe mass which  could represent abscess, sterile necrosis, or biloma.  2. Stable adenopathy in the deep liver drainage.  3. Portal hypertension with lower esophageal varices.   10/6 CT Head IMPRESSION:  1. Stable atrophy and white matter disease.  2. No acute intracranial abnormality.  3. Postsurgical changes of the right maxilla and right lateral  orbit.   10/13 Thoracic/Lumbar Xray IMPRESSION:  T12 compression fracture with mild height loss.    10/13 CXR IMPRESSION:  No active cardiopulmonary disease.   Total Time: 90 minutes  Greater than 50%  of this time was spent counseling and coordinating care related to the above assessment and plan, review of records, extensive discussion about above plan.   Doran Clay D.O. Palliative Medicine Team at [facility]  Pager: [phone] Team Phone: [phone]

## 2014-05-03 NOTE — Progress Notes (Signed)
Patient seen and examined.  Dorothy Burns is a 58 y.o. female with history of hepatocellular carcinoma, hepatitis C, cirrhosis of liver, COPD, bipolar disorder who had arterial embolization of the hypervascular mass last month presents to the ER because of persistent abdominal pain with nausea and back pain. Outpatient follow up with with [facility] for another embolization after discharge. Pain control for now.  Palliative care consulted and will notify Dr Julien Nordmann of patient's admission.   Hosie Poisson, MD [phone]

## 2014-05-03 NOTE — Progress Notes (Signed)
05/03/14 1400  Clinical Encounter Type  Visited With Patient and family together;Health care provider  Visit Type Initial;Spiritual support  Referral From Palliative care team  Spiritual Encounters  Spiritual Needs Prayer;Emotional  Stress Factors  Patient Stress Factors Family relationships;Loss;Major life changes  Family Stress Factors Health changes;Loss   Chaplain visited with the patient and one of the patient's daughters at around 2:00PM. Chaplain primarily spoke to the patient's daughter about getting an advanced directive for her mother. Patient's daughter indicated that her mother and all of the siblings should be together when it is filled out. Patient was awaken when I entered the room and at first seemed confused and chaplain had difficulty talking to her. Chaplain, patient, and patient's daughter explored the patient's sadness. Patient and her daughter explained that the patient's health and household declined rapidly a few years ago after the patient's mother passed away. Patient wept when her mother was the topic of conversation and said that she missed her dearly. Patient also said that the her mother's absence is the reason "she wants to die" sometimes. Patient asked for prayer. Chaplain prayed with patient and patient's daughter. Patient asked that the Chaplain come back tomorrow. Chaplain will follow up with patient in the days to come. Almer Bushey, Claudius Sis, Chaplain 4:19 PM

## 2014-05-03 NOTE — H&P (Addendum)
[facility] History and Physical  Dorothy Burns PJA:[id number] DOB: [birth year] DOA: 05/02/2014  Referring physician: ER physician. PCP: Barbette Merino, MD  Chief Complaint: Abdominal pain.  Patient is a poor historian.  HPI: Dorothy Burns is a 58 y.o. female with history of hepatocellular carcinoma, hepatitis C, cirrhosis of liver, COPD, bipolar disorder who had arterial embolization of the hypervascular mass last month presents to the ER because of persistent abdominal pain with nausea and also has complaint of chest pain. Patient also had come to the ER 2 days ago with similar complaints but was discharged home. Patient states pain is not improving and also at this time complained of chest pain which was retrosternal burning sensation persistent. Patient's urine drug screen is positive for cocaine. Patient also endorsed suicidal ideations. Patient's abdomen on exam appears benign. Patient denies any associated shortness of breath fever chills productive cough or any diarrhea. Cardiac markers were negative.  Review of Systems: As presented in the history of presenting illness, rest negative.  Past Medical History  Diagnosis Date  . Hypertension   . Bipolar 1 disorder   . Menorrhagia   . Post-menopausal bleeding     MOST RECENT BLEEDING  WAS NOV 2013  . Paresthesia   . COPD (chronic obstructive pulmonary disease)   . 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 Gout   . Mental disorder     bipolar  . Asthma   . Shortness of breath   . Anemia   . GERD (gastroesophageal reflux disease)   . Gallstones     ABD PAIN, N&V  . Arthritis   . Headache(784.0)   . Hepatitis     Hep C x 10 yrs  - NO TREATMENT  . PONV (postoperative nausea and vomiting)   . Cirrhosis   . Cancer   . Liver cancer    Past Surgical History  Procedure Laterality Date  . Tubal ligation    . Facial reconstruction surgery    . Multiple tooth extractions    . Hysteroscopy w/d&c  09/29/2011     Procedure: DILATATION AND CURETTAGE /HYSTEROSCOPY;  Surgeon: Mora Bellman, MD;  Location: [facility];  Service: Gynecology;  Laterality: N/A;  . Cholecystectomy  08/20/2012    Procedure: LAPAROSCOPIC CHOLECYSTECTOMY;  Surgeon: Ralene Ok, MD;  Location: [facility];  Service: General;;  . Embolization of dominant hcc within left lobe of liver  03/23/14   Social History:  reports that she has been smoking Cigarettes.  She has a 7.5 pack-year smoking history. She has never used smokeless tobacco. She reports that she does not drink alcohol or use illicit drugs. Where does patient live home. Can patient participate in ADLs? Yes.  Allergies  Allergen Reactions  . Nsaids Other (See Comments)    Liver issues   . Tylenol [Acetaminophen] Other (See Comments)    Liver issues    Family History:  Family History  Problem Relation Age of Onset  . COPD Mother   . Diabetes Mother   . Heart disease Mother   . Diabetes Father   . Heart disease Father   . Hypertension Father   . Cancer Father     basal cell ca and prostate ca      Prior to Admission medications   Medication Sig Start Date End Date Taking? Authorizing Provider  aspirin 81 MG tablet Take 81 mg by mouth daily.    Yes Historical Provider, MD  budesonide-formoterol (SYMBICORT) 160-4.5 MCG/ACT inhaler  Inhale 2 puffs into the lungs 2 (two) times daily.   Yes Historical Provider, MD  clonazePAM (KLONOPIN) 2 MG tablet Take 2 mg by mouth 2 (two) times daily. 04/18/14  Yes Mahima Pandey, MD  colchicine 0.6 MG tablet Take 0.6 mg by mouth daily with breakfast.    Yes Historical Provider, MD  Cyanocobalamin (B-12 PO) Take 1 capsule by mouth daily with breakfast.    Yes Historical Provider, MD  divalproex (DEPAKOTE) 250 MG DR tablet Take 250 mg by mouth 2 (two) times daily.   Yes Historical Provider, MD  furosemide (LASIX) 20 MG tablet Take 20 mg by mouth every morning.    Yes Historical Provider, MD  gabapentin (NEURONTIN) 300 MG capsule Take 300  mg by mouth 3 (three) times daily. 10/17/13  Yes Historical Provider, MD  hydrochlorothiazide (HYDRODIURIL) 25 MG tablet Take 25 mg by mouth every evening.   Yes Historical Provider, MD  methocarbamol (ROBAXIN) 500 MG tablet Take 1 tablet (500 mg total) by mouth every 8 (eight) hours as needed for muscle spasms (back pain). 05/02/14  Yes Kalman Drape, MD  Multiple Vitamin (MULTIVITAMIN WITH MINERALS) TABS tablet Take 1 tablet by mouth daily.   Yes Historical Provider, MD  omeprazole (PRILOSEC) 20 MG capsule Take 20 mg by mouth daily with breakfast.    Yes Historical Provider, MD  oxycodone (OXY-IR) 5 MG capsule Take 5 mg by mouth every 4 (four) hours as needed for pain. 04/18/14  Yes Mahima Bubba Camp, MD  Thiamine HCl (B-1 PO) Take 1 capsule by mouth daily.   Yes Historical Provider, MD    Physical Exam: Filed Vitals:   05/02/14 2345 05/03/14 0000 05/03/14 0015 05/03/14 0030  BP: 133/91 137/90 132/93 144/88  Pulse: 89 88 80 86  Temp:      TempSrc:      Resp:    16  SpO2: 99% 100% 100% 100%     General:  Moderately built poorly nourished.  Eyes: Anicteric no pallor.  ENT: No discharge from ears eyes nose or mouth.  Neck: No mass felt.  Cardiovascular: S1-S2 heard.  Respiratory: No rhonchi or crepitations.  Abdomen: Soft nontender bowel sounds present.  Skin: No rash.  Musculoskeletal: No edema.  Psychiatric: Has suicidal ideation.  Neurologic: Alert awake oriented to name and place. Moves all extremities.  Labs on Admission:  Basic Metabolic Panel:  Recent Labs Lab 05/01/14 1937 05/02/14 2159  NA 139 141  K 3.7 3.6*  CL 101 103  CO2 27 25  GLUCOSE 112* 82  BUN 14 16  CREATININE 0.98 0.84  CALCIUM 9.5 9.6   Liver Function Tests:  Recent Labs Lab 05/01/14 1937 05/02/14 2159  AST 82* 89*  ALT 23 24  ALKPHOS 161* 177*  BILITOT 0.9 1.2  PROT 7.6 8.0  ALBUMIN 2.8* 2.9*    Recent Labs Lab 05/01/14 1937 05/02/14 2159  LIPASE 21 14    Recent Labs Lab  05/02/14 0055 05/02/14 2159  AMMONIA 39 45   CBC:  Recent Labs Lab 05/01/14 1937 05/02/14 2159  WBC 1.9* 4.6  NEUTROABS 1.2* 3.5  HGB 11.0* 12.5  HCT 33.2* 37.7  MCV 94.9 94.0  PLT 56* 53*   Cardiac Enzymes:  Recent Labs Lab 05/02/14 2159  TROPONINI <0.30    BNP (last 3 results) No results found for this basename: PROBNP,  in the last 8760 hours CBG: No results found for this basename: GLUCAP,  in the last 168 hours  Radiological Exams on Admission:  Dg Chest 2 View  05/02/2014   CLINICAL DATA:  Abdominal pain.  Initial encounter.  EXAM: CHEST  2 VIEW  COMPARISON:  12/29/2013  FINDINGS: No cardiomegaly. No definite contour abnormality of the aorta (apparent posterior lobulation of the descending thoracic aorta in the lateral projection is not confirmed frontally or on recent thoracic spine imaging). There is no edema, consolidation, effusion, or pneumothorax. T12 compression deformity noted on previous thoracic spine imaging.  IMPRESSION: No active cardiopulmonary disease.   Electronically Signed   By: Jorje Guild M.D.   On: 05/02/2014 23:55   Dg Thoracic Spine 2 View  05/02/2014   CLINICAL DATA:  Abdominal and back pain after falling over stump. Initial encounter.  EXAM: THORACIC SPINE - 2 VIEW  COMPARISON:  11/21/2013  FINDINGS: There is a mild (25% or less) compression deformity of the T12 vertebral body consistent with compression fracture. The fracture is new from 04/10/2014. No subluxation or evidence of retropulsion. Diffuse degenerative endplate changes without focal/notable level.  IMPRESSION: T12 compression fracture with mild height loss.   Electronically Signed   By: Jorje Guild M.D.   On: 05/02/2014 01:38   Dg Lumbar Spine Complete  05/02/2014   CLINICAL DATA:  Abdominal and back pain after recent fall. Initial encounter.  EXAM: LUMBAR SPINE - COMPLETE 4+ VIEW  COMPARISON:  04/10/2014 abdominal CT  FINDINGS: There is mild (approximately 25%) anterior  wedging of the T12 vertebral body with irregularity of the superior endplate that is new from 04/10/2014 and consistent with compression fracture. No posterior retropulsion or subluxation.  No lumbar spine fracture or subluxation. Degenerative overgrowth of the lower lumbar facets, especially L5-S1 on the right.  IMPRESSION: T12 compression fracture with mild height loss.   Electronically Signed   By: Jorje Guild M.D.   On: 05/02/2014 01:40     Assessment/Plan Principal Problem:   Abdominal pain Active Problems:   Cancer, hepatocellular   Chest pain   Cocaine use   1. Abdominal pain with history of hepatocellular carcinoma and recent embolization of the hypervascular mass - I have discussed the radiologist Dr. Pascal Lux who had done the embolization last month. Dr. Pascal Lux advised that is no need to have any repeat CT scan abdomen done as patient has had a repeat one done just a few days ago with similar complaints. Patient had not kept her appointment for followup embolization last week. Dr. Pascal Lux is planning to rearrange once patient gets discharged from hospital. Continue when necessary pain relief medications and closely observe patient's oral intake. Pain may also be related T12 compression fracture. 2. Chest pain - given that patient has taken cocaine we will cycle cardiac markers. Counseling done not to abuse drugs. Repeat EKG has been ordered as the first one does not have complete tracing. 3. Suicide ideation with history of bipolar disorder - patient has been placed with suicide precautions. Continue home medications for bipolar disorder. Consult psychiatry in a.m. 4. Cirrhosis of liver and history of hepatitis C - continue diuretics. Recheck ammonia levels as patient appears mildly confused. If ammonia is elevated then we may have to stop Depakote and start lactulose. 5. Chronic thrombocytopenia - follow CBC. 6. COPD - presently not wheezing.  Consult Hospice.  Addendum - patients  ammonia levels have come elevated. I am holding Depakote and check Depakote levels. Discussed with oncoming hospitalist Dr.Akula.  Code Status: Full code.  Family Communication: None.  Disposition Plan: Admit to inpatient.    Gretchen Weinfeld N. [facility] Pager  [phone].  If 7PM-7AM, please contact night-coverage [email or web address] Password [email or web address] 05/03/2014, 1:15 AM

## 2014-05-03 NOTE — Progress Notes (Signed)
INITIAL NUTRITION ASSESSMENT  DOCUMENTATION CODES Per approved criteria  -Severe malnutrition in the context of chronic illness   INTERVENTION:  Vanilla Ensure Complete po BID, each supplement provides 350 kcal and 13 grams of protein  Continue multivitamin with minerals daily RD to follow for nutrition care plan  NUTRITION DIAGNOSIS: Inadequate oral intake related to poor appetite as evidenced by patient report  Goal: Pt to meet >/= 90% of their estimated nutrition needs   Monitor:  PO & supplemental intake, weight, labs, I/O's  Reason for Assessment: Malnutrition Screening Tool Report  58 y.o. female  Admitting Dx: Abdominal pain  ASSESSMENT: 58 y.o. Female with history of hepatocellular carcinoma, hepatitis C, cirrhosis of liver, COPD, bipolar disorder who had arterial embolization of the hypervascular mass last month; presented to the ER because of persistent abdominal pain with nausea and also has complaint of chest pain.  Patient known to Clinical Nutrition during previous hospitalization at end of September 2015 and dx with severe malnutrition; upon this RD visit, pt laying in bed with blanket over body; + pain; reports a poor appetite at present and PTA; husband at bedside -- states pt has had an approximate 30 lb weight loss in the past 3 months (160 lbs to 130 lbs); amenable to [employer] supplements; RD to order.  RD unable to complete full Nutrition Focused Physical Exam at this time, however, severe temporal wasting noted.  Patient continues to meet criteria for severe malnutrition in the context of chronic illness as evidenced by < 75% intake of estimated energy requirement for > 1 month, 18% weight loss x 3 months and severe muscle loss.  Height: Ht Readings from Last 1 Encounters:  05/03/14 5\' 3"  (1.6 m)    Weight: Wt Readings from Last 1 Encounters:  05/03/14 127 lb 12.8 oz (57.97 kg)    Ideal Body Weight: 115 lb  % Ideal Body Weight: 110%  Wt  Readings from Last 10 Encounters:  05/03/14 127 lb 12.8 oz (57.97 kg)  04/18/14 140 lb (63.504 kg)  04/11/14 128 lb 4.8 oz (58.196 kg)  03/23/14 140 lb (63.504 kg)  01/25/14 152 lb (68.947 kg)  01/16/14 152 lb (68.947 kg)  12/29/13 156 lb (70.762 kg)  12/23/13 154 lb 14.4 oz (70.262 kg)  12/22/13 156 lb (70.761 kg)  12/16/13 156 lb 14.4 oz (71.169 kg)    Usual Body Weight: 160 lb  % Usual Body Weight: 79%  BMI:  Body mass index is 22.64 kg/(m^2).  Estimated Nutritional Needs: Kcal: 1700-1900 Protein: 70-80 gm Fluid: 1.7-1.9 L  Skin: Intact  Diet Order: Sodium Restricted  EDUCATION NEEDS: -No education needs identified at this time   Intake/Output Summary (Last 24 hours) at 05/03/14 1247 Last data filed at 05/03/14 0844  Gross per 24 hour  Intake    240 ml  Output      0 ml  Net    240 ml    Labs:   Recent Labs Lab 05/01/14 1937 05/02/14 2159 05/03/14 0700  NA 139 141 145  K 3.7 3.6* 3.4*  CL 101 103 109  CO2 27 25 25   BUN 14 16 15   CREATININE 0.98 0.84 0.79  CALCIUM 9.5 9.6 9.1  GLUCOSE 112* 82 78    Scheduled Meds: . aspirin  81 mg Oral Daily  . budesonide-formoterol  2 puff Inhalation BID  . clonazePAM  2 mg Oral BID  . colchicine  0.6 mg Oral Q breakfast  . furosemide  20 mg Oral q  morning - 10a  . gabapentin  300 mg Oral TID  . hydrochlorothiazide  25 mg Oral QPM  . lactulose  20 g Oral BID  . multivitamin with minerals  1 tablet Oral Daily  . pantoprazole  40 mg Oral Daily  . potassium chloride  40 mEq Oral BID  . sodium chloride  3 mL Intravenous Q12H  . sodium chloride  3 mL Intravenous Q12H    Continuous Infusions:   Past Medical History  Diagnosis Date  . Hypertension   . Bipolar 1 disorder   . Menorrhagia   . Post-menopausal bleeding     MOST RECENT BLEEDING  WAS NOV 2013  . Paresthesia   . COPD (chronic obstructive pulmonary disease)   . 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 Gout   . Mental disorder      bipolar  . Asthma   . Shortness of breath   . Anemia   . GERD (gastroesophageal reflux disease)   . Gallstones     ABD PAIN, N&V  . Arthritis   . Headache(784.0)   . Hepatitis     Hep C x 10 yrs  - NO TREATMENT  . PONV (postoperative nausea and vomiting)   . Cirrhosis   . Cancer   . Liver cancer     Past Surgical History  Procedure Laterality Date  . Tubal ligation    . Facial reconstruction surgery    . Multiple tooth extractions    . Hysteroscopy w/d&c  09/29/2011    Procedure: DILATATION AND CURETTAGE /HYSTEROSCOPY;  Surgeon: Mora Bellman, MD;  Location: [facility];  Service: Gynecology;  Laterality: N/A;  . Cholecystectomy  08/20/2012    Procedure: LAPAROSCOPIC CHOLECYSTECTOMY;  Surgeon: Ralene Ok, MD;  Location: [facility];  Service: General;;  . Embolization of dominant hcc within left lobe of liver  03/23/14    Arthur Holms, RD, LDN Pager #: [phone] After-Hours Pager #: [phone]

## 2014-05-04 DIAGNOSIS — K648 Other hemorrhoids: Secondary | ICD-10-CM

## 2014-05-04 DIAGNOSIS — C220 Liver cell carcinoma: Secondary | ICD-10-CM

## 2014-05-04 DIAGNOSIS — F1410 Cocaine abuse, uncomplicated: Secondary | ICD-10-CM

## 2014-05-04 DIAGNOSIS — R109 Unspecified abdominal pain: Secondary | ICD-10-CM

## 2014-05-04 DIAGNOSIS — Z66 Do not resuscitate: Secondary | ICD-10-CM

## 2014-05-04 LAB — BASIC METABOLIC PANEL
Anion gap: 10 (ref 5–15)
BUN: 12 mg/dL (ref 6–23)
CHLORIDE: 106 meq/L (ref 96–112)
CO2: 25 mEq/L (ref 19–32)
Calcium: 9.2 mg/dL (ref 8.4–10.5)
Creatinine, Ser: 0.82 mg/dL (ref 0.50–1.10)
GFR calc non Af Amer: 77 mL/min — ABNORMAL LOW (ref >90)
GFR, EST AFRICAN AMERICAN: 90 mL/min — AB (ref >90)
Glucose, Bld: 96 mg/dL (ref 70–99)
POTASSIUM: 3.8 meq/L (ref 3.7–5.3)
SODIUM: 141 meq/L (ref 137–147)

## 2014-05-04 MED ORDER — MORPHINE SULFATE 2 MG/ML IJ SOLN
1.0000 mg | INTRAMUSCULAR | Status: DC | PRN
  Administered 2014-05-05 – 2014-05-08 (×8): 2 mg via INTRAVENOUS
  Filled 2014-05-04 (×8): qty 1

## 2014-05-04 MED ORDER — MEGESTROL ACETATE 40 MG PO TABS
40.0000 mg | ORAL_TABLET | Freq: Every day | ORAL | Status: DC
  Administered 2014-05-04 – 2014-05-07 (×4): 40 mg via ORAL
  Filled 2014-05-04 (×4): qty 1

## 2014-05-04 MED ORDER — NICOTINE 21 MG/24HR TD PT24
21.0000 mg | MEDICATED_PATCH | Freq: Every day | TRANSDERMAL | Status: DC
  Administered 2014-05-04 – 2014-05-09 (×6): 21 mg via TRANSDERMAL
  Filled 2014-05-04 (×6): qty 1

## 2014-05-04 MED ORDER — DIPHENHYDRAMINE HCL 25 MG PO CAPS
25.0000 mg | ORAL_CAPSULE | Freq: Once | ORAL | Status: AC
  Administered 2014-05-04: 25 mg via ORAL
  Filled 2014-05-04: qty 1

## 2014-05-04 MED ORDER — HYDROCORTISONE 2.5 % RE CREA
TOPICAL_CREAM | Freq: Three times a day (TID) | RECTAL | Status: DC
  Administered 2014-05-05: 17:00:00 via RECTAL
  Administered 2014-05-05: 1 via RECTAL
  Administered 2014-05-05 – 2014-05-09 (×8): via RECTAL
  Filled 2014-05-04 (×3): qty 28.35

## 2014-05-04 NOTE — Progress Notes (Signed)
Patient [id number] DELENA CASEBEER      DOB: 04/04/1956      IOX:[id number]   Palliative Medicine Team at [facility] Progress Note    Subjective: Moving bowels small amount. Mentation clearer today. Having a bit more pain but only taken 1 PRN medicine.  Complains of hemorrhoid pain today as well.  Long discussion about goals of care/dispo as below.      Filed Vitals:   05/04/14 0349  BP: 113/74  Pulse: 80  Temp: 98.5 F (36.9 C)  Resp: 20   Physical exam: Gen: alert, NAD CV: RRR LUNGS: CTA Abd; Soft, ND EXT: no edema  CBC    Component Value Date/Time   WBC 3.5* 05/03/2014 0700   WBC 4.5 12/16/2013 1213   RBC 3.70* 05/03/2014 0700   RBC 3.86 12/16/2013 1213   HGB 11.7* 05/03/2014 0700   HGB 11.4* 12/16/2013 1213   HCT 34.7* 05/03/2014 0700   HCT 34.9 12/16/2013 1213   PLT 61* 05/03/2014 0700   PLT 79* 12/16/2013 1213   MCV 93.8 05/03/2014 0700   MCV 90.5 12/16/2013 1213   MCH 31.6 05/03/2014 0700   MCH 29.5 12/16/2013 1213   MCHC 33.7 05/03/2014 0700   MCHC 32.7 12/16/2013 1213   RDW 15.7* 05/03/2014 0700   RDW 16.7* 12/16/2013 1213   LYMPHSABS 0.8 05/03/2014 0700   LYMPHSABS 1.3 12/16/2013 1213   MONOABS 0.4 05/03/2014 0700   MONOABS 1.0* 12/16/2013 1213   EOSABS 0.1 05/03/2014 0700   EOSABS 0.1 12/16/2013 1213   BASOSABS 0.0 05/03/2014 0700   BASOSABS 0.0 12/16/2013 1213    CMP     Component Value Date/Time   NA 141 05/04/2014 1135   NA 142 12/07/2013 1047   K 3.8 05/04/2014 1135   K 3.6 12/07/2013 1047   CL 106 05/04/2014 1135   CO2 25 05/04/2014 1135   CO2 28 12/07/2013 1047   GLUCOSE 96 05/04/2014 1135   GLUCOSE 90 12/07/2013 1047   BUN 12 05/04/2014 1135   BUN 15.7 12/07/2013 1047   CREATININE 0.82 05/04/2014 1135   CREATININE 1.1 12/07/2013 1047   CALCIUM 9.2 05/04/2014 1135   CALCIUM 9.3 12/07/2013 1047   PROT 7.0 05/03/2014 0700   PROT 7.2 12/07/2013 1047   ALBUMIN 2.6* 05/03/2014 0700   ALBUMIN 2.8* 12/07/2013 1047   AST 75* 05/03/2014 0700   AST 37*  12/07/2013 1047   ALT 20 05/03/2014 0700   ALT 23 12/07/2013 1047   ALKPHOS 155* 05/03/2014 0700   ALKPHOS 138 12/07/2013 1047   BILITOT 1.0 05/03/2014 0700   BILITOT 0.58 12/07/2013 1047   GFRNONAA 77* 05/04/2014 1135   GFRAA 90* 05/04/2014 1135     Assessment and plan: 58 yo female with PMHx of HCC s/p embolization, Hep C cirrhosis, cocaine abuse, bipolar who presented with back/abd pain, possible SI.   1. Code Status: DNR  2. Goals of Care:  See initial consultation note. Long discussion again today.  She ultimately is still not wanting to pursue chemo because of side effects possibly making her feel bad. While I dont think she has great insight into risks/beenfits of treatment, we also can not make her follow-up with Oncology. We also discussed her IR embolization procedure and missed appointment.  Her social situation and ongoing substance abuse make even palliative treatment options difficult. Discussed case with Dr Pascal Lux from IR who agrees that while she may be candidate for these palliative treatments above issues make things challenging. I suspect she will  not do well even with our best attempts to get her follow-up.  In talking through these issues with her today, she states that she wants focus to be on being out of pain.  When I asked about things like hospice to focus on comfort she has heard of this and states she tried getting hospice help before, but couldn't because she did not have a Dr's order.  In forgoing palliative IR and chemo, she would certainly be hospice candidate for her cancer and prognosis of likely 6 months or less. The challenge will bet that she also cant care for herself at home and only will go to certain nursing homes. She would not be residential hospice candidate. I have talked with social work to look into 2 options nursing facility with hospice care or nursing facility with palliative care.  Insurance will present challenge in covering hospice care in nursing  home and likely limit her options.  I also encouraged her that if she wanted to find out more about palliative chemo or treatments to follow-up with Dr Elson Areas. I do not sense that she is suicidial and does not detail any plans or thoughts to kill or harm herself. Awaiting psych input, but I do not sense that depression is making her unable to make decisions but I will defer to psych expertise. We also discussed code status with her and her father and they confirm when they talked in past she would not want to be resuscitated. i have placed DNR order. They are working on [employer] paperwork to name her son HCPOA. They both tell me he knows about her terminal condition.   3. Symptom Management:  1. Pain- Cancer related. Continue PRN oxycodone. Only needed 1 dose. If not working can increase to 2 tabs 2. Constipation-small BM's. Cont lactulose 3. Encephalopathy- improving with lactulsoe 4. Loss of Appetite- started on megace.  Monitor 5. Hemorrhoids- will order anusol cream  4. Psychosocial/Spiritual: Lives alone with some support from neighbor and daughter [name] (though relationship a bit strained). Has 5 children all together. Recently started going to [employer] again. Poor social support network with ongoing cocaine abuse. Appreciate chaplain consult.  Her Father [name] is again at bedside.    Total time 45 minutes  >50% of time spent in counseling and coordination of care regarding above assessment plan as well as case discussion with SW, Dr Karleen Hampshire, and Dr Pascal Lux  Doran Clay D.O. Palliative Medicine Team at [facility]  Pager: [phone] Team Phone: [phone]

## 2014-05-04 NOTE — Clinical Social Work Psychosocial (Signed)
Clinical Social Work Department BRIEF PSYCHOSOCIAL ASSESSMENT 05/04/2014  Patient:  Dorothy Burns, Dorothy Burns     Account Number:  [id number]     Admit date:  04/21/2008  Clinical Social Worker:  Dian Queen  Date/Time:  05/04/2014 03:06 PM  Referred by:  Physician  Date Referred:  05/04/2014 Referred for  SNF Placement   Other Referral:   Interview type:  Patient Other interview type:   Patient's father    PSYCHOSOCIAL DATA Living Status:  ALONE Admitted from facility:   Level of care:   Primary support name:   Primary support relationship to patient:  PARENT Degree of support available:   Father is at bedside and is supportive.    CURRENT CONCERNS Current Concerns  Post-Acute Placement   Other Concerns:    SOCIAL WORK ASSESSMENT / PLAN Patient is a 58 year old female who lives on her own and has Bipolar.  Patient's father is involved in her care. Patient states she is agreeable to going to SNF per doctor recommendations.  Patient currently has a Actuary in with her.  Patient states she would like to go to [facility] if beds are available, but she is open to other locations in [place].   Assessment/plan status:  Other - See comment Other assessment/ plan:   Palliative MD spoke to this social worker in regards to patient, and proposes patient may be good candidate for hospice or palliative care.   Information/referral to community resources:    PATIENT'S/FAMILY'S RESPONSE TO PLAN OF CARE: Patient and father are in agreement of plan.   Jones Broom. [place], MSW, [place] 05/04/2014 3:40 PM

## 2014-05-04 NOTE — Progress Notes (Signed)
[facility] PROGRESS NOTE  ESTEPHANI Burns JIR:[id number] DOB: 1955-12-28 DOA: 05/02/2014 PCP: Barbette Merino, MD Interim summary: Dorothy Burns is a 58 y.o. female with history of hepatocellular carcinoma, hepatitis C, cirrhosis of liver, COPD, bipolar disorder who had arterial embolization of the hypervascular mass last month presents to the ER because of persistent abdominal pain with nausea and also has complaint of chest pain. Patient also had come to the ER 2 days ago with similar complaints but was discharged home. Patient states pain is not improving and also at this time complained of chest pain which was retrosternal burning sensation persistent. Patient's urine drug screen is positive for cocaine. Patient also endorsed suicidal ideations. She was admitted to medical service for further evaluation. She was supposed to follow up with Dr Alvy Bimler and Dr watts with IR as outpatient and she never did. She was given prognosis of 6 months if no treatment for the [facility] by Dr Alvy Bimler. Palliative care consulted and recommendations given. Spoke to her daughter who wanted her mother to go to ALF or possibly SNF on discharge. PT/OT consulted.   Assessment/Plan: Abdominal pain with history of hepatocellular carcinoma and recent embolization of the hypervascular mass - Pain control and outpatient follow up with Dr Pascal Lux for follow up embolization.   Chest pain; resolved. Cardiac enzymes negative. EKG non specific t wave changes ACS ruled out.   Bipolar disorder and suicidal ideations: Continue home medications and psychiatry consulted for recommendations.   Liver cirrhosis and hepatitis C,: Elevated ammonia levels, on admission, started lactulose and repeat ammonia levels in am.  Resume lasix    HCC: Pt refusing to see an oncologist at this time. Knows the prognosis . Palliative care consulted and recommendations given.  COPD: No wheezing heard.resume symbicort   Code Status: full code.   Family Communication: father at bedside Disposition Plan: pending.    Consultants:  Psychiatry.   Palliative care consult.   Procedures:  none  Antibiotics:  none  HPI/Subjective: Reports she wants to diet. Has sitter at bedside  Objective: Filed Vitals:   05/04/14 0349  BP: 113/74  Pulse: 80  Temp: 98.5 F (36.9 C)  Resp: 20    Intake/Output Summary (Last 24 hours) at 05/04/14 1402 Last data filed at 05/04/14 1349  Gross per 24 hour  Intake    843 ml  Output      0 ml  Net    843 ml   Filed Weights   05/03/14 0113  Weight: 57.97 kg (127 lb 12.8 oz)    Exam:   General:  Alert afebrile laying in the bed.  Cardiovascular: s1s2  Respiratory: cchest clear to auscultation , no wheezing or rhonchi  Abdomen: soft tender in the right side, bowel sounds heard  Musculoskeletal: no pedal edema.   Data Reviewed: Basic Metabolic Panel:  Recent Labs Lab 05/01/14 1937 05/02/14 2159 05/03/14 0700 05/04/14 1135  NA 139 141 145 141  K 3.7 3.6* 3.4* 3.8  CL 101 103 109 106  CO2 27 25 25 25   GLUCOSE 112* 82 78 96  BUN 14 16 15 12   CREATININE 0.98 0.84 0.79 0.82  CALCIUM 9.5 9.6 9.1 9.2   Liver Function Tests:  Recent Labs Lab 05/01/14 1937 05/02/14 2159 05/03/14 0700  AST 82* 89* 75*  ALT 23 24 20   ALKPHOS 161* 177* 155*  BILITOT 0.9 1.2 1.0  PROT 7.6 8.0 7.0  ALBUMIN 2.8* 2.9* 2.6*    Recent Labs Lab 05/01/14 1937 05/02/14 2159  LIPASE 21 14    Recent Labs Lab 05/02/14 0055 05/02/14 2159 05/03/14 0220  AMMONIA 39 45 90*   CBC:  Recent Labs Lab 05/01/14 1937 05/02/14 2159 05/03/14 0700  WBC 1.9* 4.6 3.5*  NEUTROABS 1.2* 3.5 2.2  HGB 11.0* 12.5 11.7*  HCT 33.2* 37.7 34.7*  MCV 94.9 94.0 93.8  PLT 56* 53* 61*   Cardiac Enzymes:  Recent Labs Lab 05/02/14 2159 05/03/14 0220 05/03/14 0700 05/03/14 1325  TROPONINI <0.30 <0.30 <0.30 <0.30   BNP (last 3 results) No results found for this basename: PROBNP,  in the  last 8760 hours CBG: No results found for this basename: GLUCAP,  in the last 168 hours  Recent Results (from the past 240 hour(s))  URINE CULTURE     Status: None   Collection Time    05/02/14 12:50 AM      Result Value Ref Range Status   Specimen Description URINE, CLEAN CATCH   Final   Special Requests NONE   Final   Culture  Setup Time     Final   Value: 05/02/2014 05:05     Performed at [facility]     Final   Value: NO GROWTH     Performed at [facility]   Culture     Final   Value: NO GROWTH     Performed at [facility]   Report Status 05/03/2014 FINAL   Final     Studies: Dg Chest 2 View  05/02/2014   CLINICAL DATA:  Abdominal pain.  Initial encounter.  EXAM: CHEST  2 VIEW  COMPARISON:  12/29/2013  FINDINGS: No cardiomegaly. No definite contour abnormality of the aorta (apparent posterior lobulation of the descending thoracic aorta in the lateral projection is not confirmed frontally or on recent thoracic spine imaging). There is no edema, consolidation, effusion, or pneumothorax. T12 compression deformity noted on previous thoracic spine imaging.  IMPRESSION: No active cardiopulmonary disease.   Electronically Signed   By: Jorje Guild M.D.   On: 05/02/2014 23:55    Scheduled Meds: . aspirin  81 mg Oral Daily  . budesonide-formoterol  2 puff Inhalation BID  . clonazePAM  2 mg Oral BID  . colchicine  0.6 mg Oral Q breakfast  . feeding supplement (ENSURE COMPLETE)  237 mL Oral BID BM  . furosemide  20 mg Oral q morning - 10a  . gabapentin  300 mg Oral TID  . hydrochlorothiazide  25 mg Oral QPM  . lactulose  20 g Oral BID  . multivitamin with minerals  1 tablet Oral Daily  . nicotine  21 mg Transdermal Daily  . pantoprazole  40 mg Oral Daily  . sodium chloride  3 mL Intravenous Q12H  . sodium chloride  3 mL Intravenous Q12H   Continuous Infusions:   Principal Problem:   Abdominal pain Active Problems:   Cancer,  hepatocellular   Chest pain   Cocaine use    Time spent: 25 minutes.     [facility] Pager [phone]. If 7PM-7AM, please contact night-coverage at [email or web address], password [facility] 05/04/2014, 2:02 PM  LOS: 2 days

## 2014-05-04 NOTE — Clinical Social Work Placement (Signed)
Clinical Social Work Department CLINICAL SOCIAL WORK PLACEMENT NOTE 05/04/2014  Patient:  Dorothy Burns, Dorothy Burns  Account Number:  [id number] Admit date:  05/02/2014  Clinical Social Worker:  Paitlyn Mcclatchey, LCSWA  Date/time:  05/04/2014 03:44 PM  Clinical Social Work is seeking post-discharge placement for this patient at the following level of care:   [facility] [facility]   (*CSW will update this form in Epic as items are completed)   05/04/2014  Patient/family provided with [place] Department of Clinical Social Work's list of facilities offering this level of care within the geographic area requested by the patient (or if unable, by the patient's family).  05/04/2014  Patient/family informed of their freedom to choose among providers that offer the needed level of care, that participate in Medicare, Medicaid or managed care program needed by the patient, have an available bed and are willing to accept the patient.  05/04/2014  Patient/family informed of [facility]' ownership interest in [facility], as well as of the fact that they are under no obligation to receive care at this facility.  PASARR submitted to EDS on 05/04/2014 PASARR number received on 05/04/2014  FL2 transmitted to all facilities in geographic area requested by pt/family on  05/04/2014 FL2 transmitted to all facilities within larger geographic area on 05/04/2014  Patient informed that his/her managed care company has contracts with or will negotiate with  certain facilities, including the following:     Patient/family informed of bed offers received:   Patient chooses bed at  Physician recommends and patient chooses bed at    Patient to be transferred to  on   Patient to be transferred to facility by  Patient and family notified of transfer on  Name of family member notified:    The following physician request were entered in Epic:   Additional Comments:  Mikenzie Mccannon R. [place], MSW,  [place] 05/04/2014 3:52 PM

## 2014-05-04 NOTE — Progress Notes (Signed)
Responded to page that pt's daughter needed help with advanced directive. Daughter appeared anxious. Chaplain talked through the POA and living will forms with her and pt explained the process of signing with notary. Chaplain let them know that notaries and witnesses are easier to find during daytime hours. Will follow as needed.   05/04/14 2030  Clinical Encounter Type  Visited With Patient and family together  Visit Type Other (Comment) (Advanced directive)  Advance Directives (For Healthcare)  Does patient have an advance directive? No  Would patient like information on creating an advanced directive? Yes - Educational materials given   Vanetta Mulders 05/04/2014 8:34 PM

## 2014-05-05 DIAGNOSIS — F14120 Cocaine abuse with intoxication, uncomplicated: Secondary | ICD-10-CM

## 2014-05-05 DIAGNOSIS — F1994 Other psychoactive substance use, unspecified with psychoactive substance-induced mood disorder: Secondary | ICD-10-CM

## 2014-05-05 DIAGNOSIS — F3130 Bipolar disorder, current episode depressed, mild or moderate severity, unspecified: Secondary | ICD-10-CM

## 2014-05-05 LAB — CBC
HCT: 31.8 % — ABNORMAL LOW (ref 36.0–46.0)
Hemoglobin: 10.7 g/dL — ABNORMAL LOW (ref 12.0–15.0)
MCH: 31.8 pg (ref 26.0–34.0)
MCHC: 33.6 g/dL (ref 30.0–36.0)
MCV: 94.4 fL (ref 78.0–100.0)
PLATELETS: 65 10*3/uL — AB (ref 150–400)
RBC: 3.37 MIL/uL — AB (ref 3.87–5.11)
RDW: 15.4 % (ref 11.5–15.5)
WBC: 2.6 10*3/uL — AB (ref 4.0–10.5)

## 2014-05-05 LAB — COMPREHENSIVE METABOLIC PANEL
ALK PHOS: 196 U/L — AB (ref 39–117)
ALT: 26 U/L (ref 0–35)
AST: 92 U/L — ABNORMAL HIGH (ref 0–37)
Albumin: 2.4 g/dL — ABNORMAL LOW (ref 3.5–5.2)
Anion gap: 10 (ref 5–15)
BILIRUBIN TOTAL: 0.7 mg/dL (ref 0.3–1.2)
BUN: 9 mg/dL (ref 6–23)
CHLORIDE: 105 meq/L (ref 96–112)
CO2: 27 meq/L (ref 19–32)
CREATININE: 0.72 mg/dL (ref 0.50–1.10)
Calcium: 9 mg/dL (ref 8.4–10.5)
GFR calc Af Amer: >90 mL/min (ref >90)
Glucose, Bld: 115 mg/dL — ABNORMAL HIGH (ref 70–99)
POTASSIUM: 3.3 meq/L — AB (ref 3.7–5.3)
SODIUM: 142 meq/L (ref 137–147)
Total Protein: 6.7 g/dL (ref 6.0–8.3)

## 2014-05-05 LAB — AMMONIA: AMMONIA: 55 umol/L (ref 11–60)

## 2014-05-05 MED ORDER — DIVALPROEX SODIUM 125 MG PO CPSP
250.0000 mg | ORAL_CAPSULE | Freq: Two times a day (BID) | ORAL | Status: DC
  Administered 2014-05-05 – 2014-05-09 (×9): 250 mg via ORAL
  Filled 2014-05-05 (×11): qty 2

## 2014-05-05 MED ORDER — POTASSIUM CHLORIDE CRYS ER 20 MEQ PO TBCR
40.0000 meq | EXTENDED_RELEASE_TABLET | Freq: Two times a day (BID) | ORAL | Status: AC
  Administered 2014-05-05 (×2): 40 meq via ORAL
  Filled 2014-05-05: qty 2

## 2014-05-05 MED ORDER — OXYCODONE HCL 5 MG PO TABS
5.0000 mg | ORAL_TABLET | ORAL | Status: DC | PRN
  Administered 2014-05-05: 10 mg via ORAL
  Administered 2014-05-05: 5 mg via ORAL
  Administered 2014-05-06 (×2): 10 mg via ORAL
  Administered 2014-05-06 – 2014-05-07 (×2): 5 mg via ORAL
  Administered 2014-05-07: 10 mg via ORAL
  Administered 2014-05-08 (×2): 5 mg via ORAL
  Filled 2014-05-05: qty 1
  Filled 2014-05-05: qty 2
  Filled 2014-05-05: qty 1
  Filled 2014-05-05 (×2): qty 2
  Filled 2014-05-05: qty 1
  Filled 2014-05-05: qty 2
  Filled 2014-05-05 (×2): qty 1
  Filled 2014-05-05: qty 2

## 2014-05-05 NOTE — ED Provider Notes (Signed)
Medical screening examination/treatment/procedure(s) were performed by non-physician practitioner and as supervising physician I was immediately available for consultation/collaboration.   EKG Interpretation None      Pt with hx of liver cancer, substance abuse, s/p recent embolization of liver mass presents with abd pain, vomiting, diarrhea.  She has TTP across upper abd, more on right side.  She reports this pain as unchanged from her chronic pain in this area.  She appears dehydrated and will admit for fluids, monitoring.  Don't feel that pt needs repeat abd imaging due to being afebrile, pain similar to her chronic pain.  Malvin Johns, MD 05/05/14 [phone]

## 2014-05-05 NOTE — Evaluation (Signed)
Occupational Therapy Evaluation Patient Details Name: Dorothy Burns MRN: [id number] DOB: 07/25/1955 Today's Date: 05/05/2014    History of Present Illness Pt admitted severe abdominal and back pain. PMH: hepatic carcinoma s/p embolization, Hep C, cirrhosis, cocaine abuse, bipolar.   Clinical Impression   Prior to admission, pt was home alone, but struggling to function per her daughter.  Pt presents with impaired cognition, generalized weakness, pain and impaired balance interfering with ability to perform self care.  Plan is for SNF with hospice care.  Agree with this plan. Will defer OT to SNF.  Follow Up Recommendations  SNF;Supervision/Assistance - 24 hour    Equipment Recommendations       Recommendations for Other Services       Precautions / Restrictions Precautions Precautions: Fall Precaution Comments: suicide precautions, sitter      Mobility Bed Mobility Overal bed mobility: Needs Assistance Bed Mobility: Sit to Supine       Sit to supine: Supervision   General bed mobility comments: pt seated on EOB upon OTs arrival  Transfers Overall transfer level: Needs assistance Equipment used: 1 person hand held assist   Sit to Stand: Min assist              Balance     Sitting balance-Leahy Scale: Good       Standing balance-Leahy Scale: Poor                              ADL Overall ADL's : Needs assistance/impaired Eating/Feeding: Set up;Sitting   Grooming: Wash/dry hands;Min guard;Standing   Upper Body Bathing: Minimal assitance;Sitting   Lower Body Bathing: Minimal assistance;Sit to/from stand   Upper Body Dressing : Minimal assistance;Sitting   Lower Body Dressing: Minimal assistance;Sit to/from stand   Toilet Transfer: Minimal assistance;Ambulation;BSC   Toileting- Clothing Manipulation and Hygiene: Maximal assistance;Sit to/from stand       Functional mobility during ADLs: Minimal assistance (hand held) General ADL  Comments: Pt switching between activities with minimal warning increasing risk for fall.     Vision                     Perception     Praxis      Pertinent Vitals/Pain Pain Assessment: Faces Faces Pain Scale: Hurts little more Pain Location: abdomen Pain Descriptors / Indicators: Aching Pain Intervention(s): Repositioned;Premedicated before session;Monitored during session     Hand Dominance Right   Extremity/Trunk Assessment Upper Extremity Assessment Upper Extremity Assessment: Generalized weakness   Lower Extremity Assessment Lower Extremity Assessment: Generalized weakness   Cervical / Trunk Assessment Cervical / Trunk Assessment: Kyphotic   Communication Communication Communication: No difficulties   Cognition Arousal/Alertness: Awake/alert Behavior During Therapy: Impulsive;Flat affect Overall Cognitive Status: Impaired/Different from baseline Area of Impairment: Memory;Safety/judgement;Awareness;Problem solving;Attention   Current Attention Level: Sustained Memory: Decreased short-term memory   Safety/Judgement: Decreased awareness of safety;Decreased awareness of deficits Awareness: Intellectual Problem Solving: Slow processing;Decreased initiation General Comments: cognitive status is variable per daughter   General Comments       Exercises       Shoulder Instructions      Home Living Family/patient expects to be discharged to:: Skilled nursing facility Living Arrangements: Alone                                      Prior Functioning/Environment  OT Diagnosis: Generalized weakness;Acute pain   OT Problem List: Decreased activity tolerance;Decreased strength;Impaired balance (sitting and/or standing);Decreased coordination;Decreased cognition;Decreased knowledge of use of DME or AE;Pain   OT Treatment/Interventions:      OT Goals(Current goals can be found in the care plan section) Acute Rehab OT  Goals Patient Stated Goal: I just want to sleep.  OT Frequency:     Barriers to D/C: Decreased caregiver support          Co-evaluation              End of Session Equipment Utilized During Treatment: Gait belt  Activity Tolerance: Patient limited by fatigue Patient left: in bed;with call bell/phone within reach;with family/visitor present;with nursing/sitter in room   Time: 0834-0900 OT Time Calculation (min): 26 min Charges:  OT General Charges $OT Visit: 1 Procedure OT Evaluation $Initial OT Evaluation Tier I: 1 Procedure OT Treatments $Self Care/Home Management : 8-22 mins G-Codes:    Malka So 05/05/2014, 9:43 AM [phone]

## 2014-05-05 NOTE — Progress Notes (Signed)
Medicare Important Message given? YES  (If response is "NO", the following Medicare IM given date fields will be blank)  Date Medicare IM given: 05/05/2014  Medicare IM given by: [facility]

## 2014-05-05 NOTE — Progress Notes (Signed)
05/05/14 1000  Clinical Encounter Type  Visited With Patient and family together  Visit Type Follow-up;Spiritual support  Referral From Patient;Family;Chaplain  Spiritual Encounters  Spiritual Needs Emotional   Chaplain followed up with the patient this morning and completed the patient's advanced directive. Patient was accompanied by her daughter who is now her health care power of attorney. Patient was emotional during our time together this morning. Chaplain will follow up at a later time to fulfill the patient's request for prayer. Gar Ponto, Chaplain 10:53 AM

## 2014-05-05 NOTE — Evaluation (Signed)
Physical Therapy Evaluation Patient Details Name: Dorothy Burns MRN: [id number] DOB: [birth year] Today's Date: 05/05/2014   History of Present Illness  Pt admitted severe abdominal and back pain. PMH: hepatic carcinoma s/p embolization, Hep C, cirrhosis, cocaine abuse, bipolar.  Clinical Impression  Pt admitted with above. Pt currently with functional limitations due to the deficits listed below (see PT Problem List). Min assist needed for hand-held ambulation.  Pt declined to ambulate in hallway.  Encouragement needed for pt to participate.  She demo high fall risk.  Pt will benefit from skilled PT to increase their independence and safety with mobility to allow discharge to the venue listed below. Recommend SNF at d/c.      Follow Up Recommendations SNF;Supervision/Assistance - 24 hour    Equipment Recommendations  None recommended by PT    Recommendations for Other Services       Precautions / Restrictions Precautions Precautions: Fall Precaution Comments: suicide precautions, sitter      Mobility  Bed Mobility Overal bed mobility: Needs Assistance Bed Mobility: Supine to Sit       Sit to supine: Supervision   General bed mobility comments: use of bed rail  Transfers Overall transfer level: Needs assistance Equipment used: 1 person hand held assist Transfers: Sit to/from Stand;Stand Pivot Transfers Sit to Stand: Min assist Stand pivot transfers: Min assist       General transfer comment: verbal cues for safety  Ambulation/Gait Ambulation/Gait assistance: Min guard Ambulation Distance (Feet): 50 Feet Assistive device: None Gait Pattern/deviations: Step-through pattern   Gait velocity interpretation: at or above normal speed for age/gender    Stairs            Wheelchair Mobility    Modified Rankin (Stroke Patients Only)       Balance   Sitting-balance support: No upper extremity supported;Feet supported Sitting balance-Leahy Scale: Good      Standing balance support: Single extremity supported Standing balance-Leahy Scale: Poor                               Pertinent Vitals/Pain Pain Assessment: 0-10 Pain Score: 10-Worst pain ever Faces Pain Scale: Hurts little more Pain Location: abdomen Pain Descriptors / Indicators: Aching Pain Intervention(s): Patient requesting pain meds-RN notified;Monitored during session    Home Living Family/patient expects to be discharged to:: Skilled nursing facility Living Arrangements: Alone Available Help at Discharge: Family;Available PRN/intermittently Type of Home: Apartment Home Access: Ramped entrance     Home Layout: One level Home Equipment: Walker - 4 wheels;Bedside commode;Wheelchair - power      Prior Function Level of Independence: Independent with assistive device(s)               Hand Dominance   Dominant Hand: Right    Extremity/Trunk Assessment   Upper Extremity Assessment: Generalized weakness           Lower Extremity Assessment: Generalized weakness      Cervical / Trunk Assessment: Kyphotic  Communication   Communication: No difficulties  Cognition Arousal/Alertness: Awake/alert Behavior During Therapy: Impulsive;Flat affect Overall Cognitive Status: Impaired/Different from baseline Area of Impairment: Memory;Safety/judgement;Awareness;Problem solving;Attention   Current Attention Level: Sustained Memory: Decreased short-term memory   Safety/Judgement: Decreased awareness of safety;Decreased awareness of deficits Awareness: Intellectual Problem Solving: Slow processing;Decreased initiation General Comments: cognitive status is variable per daughter    General Comments      Exercises        Assessment/Plan  PT Assessment Patient needs continued PT services  PT Diagnosis Acute pain;Generalized weakness;Difficulty walking   PT Problem List Decreased strength;Decreased balance;Decreased activity  tolerance;Decreased mobility;Decreased safety awareness;Decreased cognition  PT Treatment Interventions DME instruction;Gait training;Functional mobility training;Therapeutic activities;Therapeutic exercise;Patient/family education;Balance training   PT Goals (Current goals can be found in the Care Plan section) Acute Rehab PT Goals Patient Stated Goal: not stated PT Goal Formulation: With patient/family Time For Goal Achievement: 05/19/14 Potential to Achieve Goals: Fair    Frequency Min 2X/week   Barriers to discharge Decreased caregiver support      Co-evaluation               End of Session Equipment Utilized During Treatment: Gait belt Activity Tolerance: Patient tolerated treatment well Patient left: in bed;with call bell/phone within reach;with family/visitor present;with nursing/sitter in room Nurse Communication: Mobility status         Time: 6606-3016 PT Time Calculation (min): 14 min   Charges:   PT Evaluation $Initial PT Evaluation Tier I: 1 Procedure PT Treatments $Gait Training: 8-22 mins   PT G Codes:          Lorriane Shire 05/05/2014, 10:35 AM

## 2014-05-05 NOTE — Consult Note (Signed)
[facility] Face-to-Face Psychiatry Consult   Reason for Consult:  Substance induced mood disorder, cocaine intoxication and bipolar disorder Referring Physician:  Dr. Richardson Landry is an 58 y.o. female. Total Time spent with patient: 45 minutes  Assessment: AXIS I:  Bipolar, Depressed, Substance Induced Mood Disorder and cocaine intoxication AXIS II:  Deferred AXIS III:   Past Medical History  Diagnosis Date  . Hypertension   . Bipolar 1 disorder   . Menorrhagia   . Post-menopausal bleeding     MOST RECENT BLEEDING  WAS NOV 2013  . Paresthesia   . COPD (chronic obstructive pulmonary disease)   . 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 Gout   . Mental disorder     bipolar  . Asthma   . Shortness of breath   . Anemia   . GERD (gastroesophageal reflux disease)   . Gallstones     ABD PAIN, N&V  . Arthritis   . Headache(784.0)   . Hepatitis     Hep C x 10 yrs  - NO TREATMENT  . PONV (postoperative nausea and vomiting)   . Cirrhosis   . Cancer   . Liver cancer    AXIS IV:  economic problems, occupational problems, other psychosocial or environmental problems, problems related to social environment and problems with primary support group AXIS V:  41-50 serious symptoms  Plan: Continue current medication management Recommend no additional medications at this time Refer to psych social service for appropriate disposition plans No evidence of imminent risk to self or others at present.   Patient does not meet criteria for psychiatric inpatient admission. Supportive therapy provided about ongoing stressors. Discussed crisis plan, support from social network, calling 911, coming to the Emergency Department, and calling Suicide Hotline. Refer to residential substanace abuse treatment when medically stable She also benefits from ALF or SNF on discharge as she has limited psychosocial support and needs frequent medical attention.   Subjective:   Dorothy Burns is  a 58 y.o. female patient admitted with chest pain.  HPI:  Dorothy Burns is a 58 y.o. female admitted to [facility] with history of bipolar disorder and substance abuse. She has cocaine intoxication on arrival along with schest pain, abdominal pain and nausea. She is stabilized on her physical symptoms and requesting treatment for bipolar depression and substance induced mood disorder with passive suicidal ideations. She has denied active suicidal / homicidal ideations, intention or plans. Patient has been compliant with her medications and reportedly no side effects. She has multiple medical problems and psychosocial stresses. She is interested to stay sober but requested long term residential substance abuse rehab treatment, she prefers 90 days program. She was previously received detox treatment and rehab treatment and reportedly stayed sober about one month and this time she is hoping to stay longer.   Medical history: Patient with history of hepatocellular carcinoma, hepatitis C, cirrhosis of liver, COPD, bipolar disorder who had arterial embolization of the hypervascular mass last month presents to the ER because of persistent abdominal pain with nausea and also has complaint of chest pain. Patient also had come to the ER 2 days ago with similar complaints but was discharged home. Patient states pain is not improving and also at this time complained of chest pain which was retrosternal burning sensation persistent. Patient's urine drug screen is positive for cocaine. Patient also endorsed suicidal ideations. She was admitted to medical service for further evaluation.  She was supposed to follow up with Dr Alvy Bimler and Dr watts with IR as outpatient and she never did. She was given prognosis of 6 months if no treatment for the [facility] by Dr Alvy Bimler. Palliative care consulted and recommendations given. Spoke to her daughter who wanted her mother to go to ALF or possibly SNF on discharge. PT/OT  consulted  HPI Elements:   Location:  depression and substance abuse. Quality:  self medicating for depression and other stresses. Severity:  chest pain and suicidal thoughts. Timing:  medical and psychosocial stresses.  Past Psychiatric History: Past Medical History  Diagnosis Date  . Hypertension   . Bipolar 1 disorder   . Menorrhagia   . Post-menopausal bleeding     MOST RECENT BLEEDING  WAS NOV 2013  . Paresthesia   . COPD (chronic obstructive pulmonary disease)   . 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 Gout   . Mental disorder     bipolar  . Asthma   . Shortness of breath   . Anemia   . GERD (gastroesophageal reflux disease)   . Gallstones     ABD PAIN, N&V  . Arthritis   . Headache(784.0)   . Hepatitis     Hep C x 10 yrs  - NO TREATMENT  . PONV (postoperative nausea and vomiting)   . Cirrhosis   . Cancer   . Liver cancer     reports that she has been smoking Cigarettes.  She has a 7.5 pack-year smoking history. She has never used smokeless tobacco. She reports that she does not drink alcohol or use illicit drugs. Family History  Problem Relation Age of Onset  . COPD Mother   . Diabetes Mother   . Heart disease Mother   . Diabetes Father   . Heart disease Father   . Hypertension Father   . Cancer Father     basal cell ca and prostate ca     Living Arrangements: Alone   Abuse/Neglect [facility]) Physical Abuse: Denies Verbal Abuse: Denies Sexual Abuse: Denies Allergies:   Allergies  Allergen Reactions  . Nsaids Other (See Comments)    Liver issues   . Tylenol [Acetaminophen] Other (See Comments)    Liver issues    ACT Assessment Complete:  No Objective: Blood pressure 109/73, pulse 81, temperature 98.5 F (36.9 C), temperature source Oral, resp. rate 20, height 5' 3"  (1.6 m), weight 57.97 kg (127 lb 12.8 oz), last menstrual period 05/21/2012, SpO2 98.00%.Body mass index is 22.64 kg/(m^2). Results for orders placed during the hospital  encounter of 05/02/14 (from the past 72 hour(s))  URINALYSIS, ROUTINE W REFLEX MICROSCOPIC     Status: Abnormal   Collection Time    05/02/14  9:55 PM      Result Value Ref Range   Color, Urine AMBER (*) YELLOW   Comment: BIOCHEMICALS MAY BE AFFECTED BY COLOR   APPearance CLOUDY (*) CLEAR   Specific Gravity, Urine 1.011  1.005 - 1.030   pH 6.5  5.0 - 8.0   Glucose, UA NEGATIVE  NEGATIVE mg/dL   Hgb urine dipstick SMALL (*) NEGATIVE   Bilirubin Urine NEGATIVE  NEGATIVE   Ketones, ur 15 (*) NEGATIVE mg/dL   Protein, ur NEGATIVE  NEGATIVE mg/dL   Urobilinogen, UA 1.0  0.0 - 1.0 mg/dL   Nitrite NEGATIVE  NEGATIVE   Leukocytes, UA TRACE (*) NEGATIVE  URINE RAPID DRUG SCREEN (HOSP PERFORMED)     Status: Abnormal  Collection Time    05/02/14  9:55 PM      Result Value Ref Range   Opiates NONE DETECTED  NONE DETECTED   Cocaine POSITIVE (*) NONE DETECTED   Benzodiazepines NONE DETECTED  NONE DETECTED   Amphetamines NONE DETECTED  NONE DETECTED   Tetrahydrocannabinol NONE DETECTED  NONE DETECTED   Barbiturates NONE DETECTED  NONE DETECTED   Comment:            DRUG SCREEN FOR MEDICAL PURPOSES     ONLY.  IF CONFIRMATION IS NEEDED     FOR ANY PURPOSE, NOTIFY LAB     WITHIN 5 DAYS.                LOWEST DETECTABLE LIMITS     FOR URINE DRUG SCREEN     Drug Class       Cutoff (ng/mL)     Amphetamine      1000     Barbiturate      200     Benzodiazepine   381     Tricyclics       829     Opiates          300     Cocaine          300     THC              50  PREGNANCY, URINE     Status: None   Collection Time    05/02/14  9:55 PM      Result Value Ref Range   Preg Test, Ur NEGATIVE  NEGATIVE   Comment:            THE SENSITIVITY OF THIS     METHODOLOGY IS >20 mIU/mL.  URINE MICROSCOPIC-ADD ON     Status: Abnormal   Collection Time    05/02/14  9:55 PM      Result Value Ref Range   Squamous Epithelial / LPF RARE  RARE   WBC, UA 3-6  <3 WBC/hpf   RBC / HPF 3-6  <3 RBC/hpf    Bacteria, UA FEW (*) RARE   Urine-Other MUCOUS PRESENT    CBC WITH DIFFERENTIAL     Status: Abnormal   Collection Time    05/02/14  9:59 PM      Result Value Ref Range   WBC 4.6  4.0 - 10.5 K/uL   RBC 4.01  3.87 - 5.11 MIL/uL   Hemoglobin 12.5  12.0 - 15.0 g/dL   HCT 37.7  36.0 - 46.0 %   MCV 94.0  78.0 - 100.0 fL   MCH 31.2  26.0 - 34.0 pg   MCHC 33.2  30.0 - 36.0 g/dL   RDW 15.6 (*) 11.5 - 15.5 %   Platelets 53 (*) 150 - 400 K/uL   Comment: CONSISTENT WITH PREVIOUS RESULT   Neutrophils Relative % 76  43 - 77 %   Neutro Abs 3.5  1.7 - 7.7 K/uL   Lymphocytes Relative 12  12 - 46 %   Lymphs Abs 0.6 (*) 0.7 - 4.0 K/uL   Monocytes Relative 11  3 - 12 %   Monocytes Absolute 0.5  0.1 - 1.0 K/uL   Eosinophils Relative 1  0 - 5 %   Eosinophils Absolute 0.0  0.0 - 0.7 K/uL   Basophils Relative 0  0 - 1 %   Basophils Absolute 0.0  0.0 - 0.1 K/uL  COMPREHENSIVE METABOLIC PANEL  Status: Abnormal   Collection Time    05/02/14  9:59 PM      Result Value Ref Range   Sodium 141  137 - 147 mEq/L   Potassium 3.6 (*) 3.7 - 5.3 mEq/L   Chloride 103  96 - 112 mEq/L   CO2 25  19 - 32 mEq/L   Glucose, Bld 82  70 - 99 mg/dL   BUN 16  6 - 23 mg/dL   Creatinine, Ser 0.84  0.50 - 1.10 mg/dL   Calcium 9.6  8.4 - 10.5 mg/dL   Total Protein 8.0  6.0 - 8.3 g/dL   Albumin 2.9 (*) 3.5 - 5.2 g/dL   AST 89 (*) 0 - 37 U/L   ALT 24  0 - 35 U/L   Alkaline Phosphatase 177 (*) 39 - 117 U/L   Total Bilirubin 1.2  0.3 - 1.2 mg/dL   GFR calc non Af Amer 75 (*) >90 mL/min   GFR calc Af Amer 87 (*) >90 mL/min   Comment: (NOTE)     The eGFR has been calculated using the CKD EPI equation.     This calculation has not been validated in all clinical situations.     eGFR's persistently <90 mL/min signify possible Chronic Kidney     Disease.   Anion gap 13  5 - 15  LIPASE, BLOOD     Status: None   Collection Time    05/02/14  9:59 PM      Result Value Ref Range   Lipase 14  11 - 59 U/L  ACETAMINOPHEN LEVEL      Status: None   Collection Time    05/02/14  9:59 PM      Result Value Ref Range   Acetaminophen (Tylenol), Serum <15.0  10 - 30 ug/mL   Comment:            THERAPEUTIC CONCENTRATIONS VARY     SIGNIFICANTLY. A RANGE OF 10-30     ug/mL MAY BE AN EFFECTIVE     CONCENTRATION FOR MANY PATIENTS.     HOWEVER, SOME ARE BEST TREATED     AT CONCENTRATIONS OUTSIDE THIS     RANGE.     ACETAMINOPHEN CONCENTRATIONS     >150 ug/mL AT 4 HOURS AFTER     INGESTION AND >50 ug/mL AT 12     HOURS AFTER INGESTION ARE     OFTEN ASSOCIATED WITH TOXIC     REACTIONS.  SALICYLATE LEVEL     Status: Abnormal   Collection Time    05/02/14  9:59 PM      Result Value Ref Range   Salicylate Lvl <6.3 (*) 2.8 - 20.0 mg/dL  ETHANOL     Status: None   Collection Time    05/02/14  9:59 PM      Result Value Ref Range   Alcohol, Ethyl (B) <11  0 - 11 mg/dL   Comment:            LOWEST DETECTABLE LIMIT FOR     SERUM ALCOHOL IS 11 mg/dL     FOR MEDICAL PURPOSES ONLY  AMMONIA     Status: None   Collection Time    05/02/14  9:59 PM      Result Value Ref Range   Ammonia 45  11 - 60 umol/L  TROPONIN I     Status: None   Collection Time    05/02/14  9:59 PM      Result Value  Ref Range   Troponin I <0.30  <0.30 ng/mL   Comment:            Due to the release kinetics of cTnI,     a negative result within the first hours     of the onset of symptoms does not rule out     myocardial infarction with certainty.     If myocardial infarction is still suspected,     repeat the test at appropriate intervals.  I-STAT CG4 LACTIC ACID, ED     Status: Abnormal   Collection Time    05/02/14 10:13 PM      Result Value Ref Range   Lactic Acid, Venous 2.40 (*) 0.5 - 2.2 mmol/L  I-STAT TROPOININ, ED     Status: None   Collection Time    05/02/14 11:11 PM      Result Value Ref Range   Troponin i, poc 0.02  0.00 - 0.08 ng/mL   Comment 3            Comment: Due to the release kinetics of cTnI,     a negative result  within the first hours     of the onset of symptoms does not rule out     myocardial infarction with certainty.     If myocardial infarction is still suspected,     repeat the test at appropriate intervals.  TROPONIN I     Status: None   Collection Time    05/03/14  2:20 AM      Result Value Ref Range   Troponin I <0.30  <0.30 ng/mL   Comment:            Due to the release kinetics of cTnI,     a negative result within the first hours     of the onset of symptoms does not rule out     myocardial infarction with certainty.     If myocardial infarction is still suspected,     repeat the test at appropriate intervals.  AMMONIA     Status: Abnormal   Collection Time    05/03/14  2:20 AM      Result Value Ref Range   Ammonia 90 (*) 11 - 60 umol/L  TROPONIN I     Status: None   Collection Time    05/03/14  7:00 AM      Result Value Ref Range   Troponin I <0.30  <0.30 ng/mL   Comment:            Due to the release kinetics of cTnI,     a negative result within the first hours     of the onset of symptoms does not rule out     myocardial infarction with certainty.     If myocardial infarction is still suspected,     repeat the test at appropriate intervals.  COMPREHENSIVE METABOLIC PANEL     Status: Abnormal   Collection Time    05/03/14  7:00 AM      Result Value Ref Range   Sodium 145  137 - 147 mEq/L   Potassium 3.4 (*) 3.7 - 5.3 mEq/L   Chloride 109  96 - 112 mEq/L   CO2 25  19 - 32 mEq/L   Glucose, Bld 78  70 - 99 mg/dL   BUN 15  6 - 23 mg/dL   Creatinine, Ser 0.79  0.50 - 1.10 mg/dL   Calcium 9.1  8.4 -  10.5 mg/dL   Total Protein 7.0  6.0 - 8.3 g/dL   Albumin 2.6 (*) 3.5 - 5.2 g/dL   AST 75 (*) 0 - 37 U/L   ALT 20  0 - 35 U/L   Alkaline Phosphatase 155 (*) 39 - 117 U/L   Total Bilirubin 1.0  0.3 - 1.2 mg/dL   GFR calc non Af Amer >90  >90 mL/min   GFR calc Af Amer >90  >90 mL/min   Comment: (NOTE)     The eGFR has been calculated using the CKD EPI equation.      This calculation has not been validated in all clinical situations.     eGFR's persistently <90 mL/min signify possible Chronic Kidney     Disease.   Anion gap 11  5 - 15  CBC WITH DIFFERENTIAL     Status: Abnormal   Collection Time    05/03/14  7:00 AM      Result Value Ref Range   WBC 3.5 (*) 4.0 - 10.5 K/uL   RBC 3.70 (*) 3.87 - 5.11 MIL/uL   Hemoglobin 11.7 (*) 12.0 - 15.0 g/dL   HCT 34.7 (*) 36.0 - 46.0 %   MCV 93.8  78.0 - 100.0 fL   MCH 31.6  26.0 - 34.0 pg   MCHC 33.7  30.0 - 36.0 g/dL   RDW 15.7 (*) 11.5 - 15.5 %   Platelets 61 (*) 150 - 400 K/uL   Comment: CONSISTENT WITH PREVIOUS RESULT   Neutrophils Relative % 64  43 - 77 %   Neutro Abs 2.2  1.7 - 7.7 K/uL   Lymphocytes Relative 22  12 - 46 %   Lymphs Abs 0.8  0.7 - 4.0 K/uL   Monocytes Relative 12  3 - 12 %   Monocytes Absolute 0.4  0.1 - 1.0 K/uL   Eosinophils Relative 2  0 - 5 %   Eosinophils Absolute 0.1  0.0 - 0.7 K/uL   Basophils Relative 0  0 - 1 %   Basophils Absolute 0.0  0.0 - 0.1 K/uL  VALPROIC ACID LEVEL     Status: None   Collection Time    05/03/14  8:07 AM      Result Value Ref Range   Valproic Acid Lvl 63.9  50.0 - 100.0 ug/mL  LACTIC ACID, PLASMA     Status: Abnormal   Collection Time    05/03/14  9:34 AM      Result Value Ref Range   Lactic Acid, Venous 2.4 (*) 0.5 - 2.2 mmol/L  TROPONIN I     Status: None   Collection Time    05/03/14  1:25 PM      Result Value Ref Range   Troponin I <0.30  <0.30 ng/mL   Comment:            Due to the release kinetics of cTnI,     a negative result within the first hours     of the onset of symptoms does not rule out     myocardial infarction with certainty.     If myocardial infarction is still suspected,     repeat the test at appropriate intervals.  BASIC METABOLIC PANEL     Status: Abnormal   Collection Time    05/04/14 11:35 AM      Result Value Ref Range   Sodium 141  137 - 147 mEq/L   Potassium 3.8  3.7 - 5.3 mEq/L   Chloride  106  96 - 112  mEq/L   CO2 25  19 - 32 mEq/L   Glucose, Bld 96  70 - 99 mg/dL   BUN 12  6 - 23 mg/dL   Creatinine, Ser 0.82  0.50 - 1.10 mg/dL   Calcium 9.2  8.4 - 10.5 mg/dL   GFR calc non Af Amer 77 (*) >90 mL/min   GFR calc Af Amer 90 (*) >90 mL/min   Comment: (NOTE)     The eGFR has been calculated using the CKD EPI equation.     This calculation has not been validated in all clinical situations.     eGFR's persistently <90 mL/min signify possible Chronic Kidney     Disease.   Anion gap 10  5 - 15  AMMONIA     Status: None   Collection Time    05/05/14  4:23 AM      Result Value Ref Range   Ammonia 55  11 - 60 umol/L  CBC     Status: Abnormal   Collection Time    05/05/14  4:23 AM      Result Value Ref Range   WBC 2.6 (*) 4.0 - 10.5 K/uL   RBC 3.37 (*) 3.87 - 5.11 MIL/uL   Hemoglobin 10.7 (*) 12.0 - 15.0 g/dL   HCT 31.8 (*) 36.0 - 46.0 %   MCV 94.4  78.0 - 100.0 fL   MCH 31.8  26.0 - 34.0 pg   MCHC 33.6  30.0 - 36.0 g/dL   RDW 15.4  11.5 - 15.5 %   Platelets 65 (*) 150 - 400 K/uL   Comment: CONSISTENT WITH PREVIOUS RESULT  COMPREHENSIVE METABOLIC PANEL     Status: Abnormal   Collection Time    05/05/14  4:23 AM      Result Value Ref Range   Sodium 142  137 - 147 mEq/L   Potassium 3.3 (*) 3.7 - 5.3 mEq/L   Chloride 105  96 - 112 mEq/L   CO2 27  19 - 32 mEq/L   Glucose, Bld 115 (*) 70 - 99 mg/dL   BUN 9  6 - 23 mg/dL   Creatinine, Ser 0.72  0.50 - 1.10 mg/dL   Calcium 9.0  8.4 - 10.5 mg/dL   Total Protein 6.7  6.0 - 8.3 g/dL   Albumin 2.4 (*) 3.5 - 5.2 g/dL   AST 92 (*) 0 - 37 U/L   ALT 26  0 - 35 U/L   Alkaline Phosphatase 196 (*) 39 - 117 U/L   Total Bilirubin 0.7  0.3 - 1.2 mg/dL   GFR calc non Af Amer >90  >90 mL/min   GFR calc Af Amer >90  >90 mL/min   Comment: (NOTE)     The eGFR has been calculated using the CKD EPI equation.     This calculation has not been validated in all clinical situations.     eGFR's persistently <90 mL/min signify possible Chronic Kidney      Disease.   Anion gap 10  5 - 15   Labs are reviewed.  Current Facility-Administered Medications  Medication Dose Route Frequency Provider Last Rate Last Dose  . aspirin chewable tablet 81 mg  81 mg Oral Daily Rise Patience, MD   81 mg at 05/04/14 1008  . budesonide-formoterol (SYMBICORT) 160-4.5 MCG/ACT inhaler 2 puff  2 puff Inhalation BID Rise Patience, MD   2 puff at 05/03/14 2120  . clonazePAM (KLONOPIN) tablet 2  mg  2 mg Oral BID Rise Patience, MD   2 mg at 05/04/14 2214  . colchicine tablet 0.6 mg  0.6 mg Oral Q breakfast Rise Patience, MD   0.6 mg at 05/05/14 0837  . feeding supplement (ENSURE COMPLETE) (ENSURE COMPLETE) liquid 237 mL  237 mL Oral BID BM Rogue Bussing, RD   237 mL at 05/04/14 1500  . furosemide (LASIX) tablet 20 mg  20 mg Oral q morning - 10a Rise Patience, MD   20 mg at 05/04/14 1008  . gabapentin (NEURONTIN) capsule 300 mg  300 mg Oral TID Rise Patience, MD   300 mg at 05/04/14 2215  . hydrochlorothiazide (HYDRODIURIL) tablet 25 mg  25 mg Oral QPM Rise Patience, MD   25 mg at 05/04/14 1900  . hydrocortisone (ANUSOL-HC) 2.5 % rectal cream   Rectal TID Brock Ra Lampkin, DO      . lactulose (CHRONULAC) 10 GM/15ML solution 20 g  20 g Oral BID Brock Ra Lampkin, DO   20 g at 05/04/14 2214  . megestrol (MEGACE) tablet 40 mg  40 mg Oral Daily Hosie Poisson, MD   40 mg at 05/04/14 1556  . methocarbamol (ROBAXIN) tablet 500 mg  500 mg Oral Q8H PRN Rise Patience, MD      . morphine 2 MG/ML injection 1-2 mg  1-2 mg Intravenous Q2H PRN Hosie Poisson, MD      . multivitamin with minerals tablet 1 tablet  1 tablet Oral Daily Rise Patience, MD   1 tablet at 05/04/14 1008  . nicotine (NICODERM CQ - dosed in mg/24 hours) patch 21 mg  21 mg Transdermal Daily Hosie Poisson, MD   21 mg at 05/04/14 1300  . ondansetron (ZOFRAN) tablet 4 mg  4 mg Oral Q6H PRN Rise Patience, MD       Or  . ondansetron [facility]) injection 4  mg  4 mg Intravenous Q6H PRN Rise Patience, MD      . oxyCODONE (Oxy IR/ROXICODONE) immediate release tablet 5-10 mg  5-10 mg Oral Q4H PRN Hosie Poisson, MD   5 mg at 05/05/14 0937  . pantoprazole (PROTONIX) EC tablet 40 mg  40 mg Oral Daily Rise Patience, MD   40 mg at 05/04/14 1008  . potassium chloride SA (K-DUR,KLOR-CON) CR tablet 40 mEq  40 mEq Oral BID Hosie Poisson, MD      . sodium chloride 0.9 % injection 3 mL  3 mL Intravenous Q12H Rise Patience, MD   3 mL at 05/04/14 1000  . sodium chloride 0.9 % injection 3 mL  3 mL Intravenous Q12H Rise Patience, MD   3 mL at 05/04/14 2215    Psychiatric Specialty Exam: Physical Exam as per history and physical  Review of Systems  Constitutional: Positive for malaise/fatigue.  Cardiovascular: Positive for chest pain.  Gastrointestinal: Positive for abdominal pain.  Neurological: Positive for weakness and headaches.  Psychiatric/Behavioral: Positive for depression and substance abuse. The patient is nervous/anxious and has insomnia.     Blood pressure 109/73, pulse 81, temperature 98.5 F (36.9 C), temperature source Oral, resp. rate 20, height 5' 3"  (1.6 m), weight 57.97 kg (127 lb 12.8 oz), last menstrual period 05/21/2012, SpO2 98.00%.Body mass index is 22.64 kg/(m^2).  General Appearance: Disheveled and Guarded  Eye Contact::  Fair  Speech:  Slow  Volume:  Decreased  Mood:  Depressed and Worthless  Affect:  Constricted and Depressed  Thought Process:  Coherent and Goal Directed  Orientation:  Full (Time, Place, and Person)  Thought Content:  WDL  Suicidal Thoughts:  No  Homicidal Thoughts:  No  Memory:  Immediate;   Fair Recent;   Fair  Judgement:  Intact  Insight:  Lacking  Psychomotor Activity:  Decreased  Concentration:  Fair  Recall:  Good  Fund of Knowledge:Good  Language: Good  Akathisia:  NA  Handed:  Right  AIMS (if indicated):     Assets:  Communication Skills Desire for  Improvement Housing Leisure Time Physical Health Resilience Social Support  Sleep:      Musculoskeletal: Strength & Muscle Tone: decreased Gait & Station: normal Patient leans: N/A  Treatment Plan Summary: Daily contact with patient to assess and evaluate symptoms and progress in treatment Medication management Continue current medication management and stay sober from drugs May refer to residential substance abuse rehab treatment when medically stable.  Ermon Sagan,JANARDHAHA R. 05/05/2014 10:21 AM

## 2014-05-05 NOTE — Progress Notes (Signed)
Two different attempts made to administer scheduled Symbicort. MD was in with pt first attempt and several people were in the room discussing POA and living will information at second attempt. RT will check back at a later time to administer inhaler. RT will continue to monitor.

## 2014-05-05 NOTE — Progress Notes (Addendum)
[facility] PROGRESS NOTE  CHELSAE ZANELLA OJJ:[id number] DOB: February 28, 1956 DOA: 05/02/2014 PCP: Barbette Merino, MD Interim summary: Dorothy Burns is a 58 y.o. female with history of hepatocellular carcinoma, hepatitis C, cirrhosis of liver, COPD, bipolar disorder who had arterial embolization of the hypervascular mass last month presents to the ER because of persistent abdominal pain with nausea and also has complaint of chest pain. Patient also had come to the ER 2 days ago with similar complaints but was discharged home. Patient states pain is not improving and also at this time complained of chest pain which was retrosternal burning sensation persistent. Patient's urine drug screen is positive for cocaine. Patient also endorsed suicidal ideations. She was admitted to medical service for further evaluation. She was supposed to follow up with Dr Alvy Bimler and Dr watts with IR as outpatient and she never did. She was given prognosis of 6 months if no treatment for the [facility] by Dr Alvy Bimler. Palliative care consulted and recommendations given. Spoke to her daughter who wanted her mother to go to ALF or possibly SNF on discharge. PT/OT consulted.   Assessment/Plan: Abdominal pain with history of hepatocellular carcinoma and recent embolization of the hypervascular mass - Pain control and outpatient follow up with Dr Pascal Lux for follow up embolization.   Chest pain; resolved. Cardiac enzymes negative. EKG non specific t wave changes ACS ruled out.   Bipolar disorder and suicidal ideations: Continue home medications and psychiatry consulted for recommendations.   Liver cirrhosis and hepatitis C,: Elevated ammonia levels, on admission, started lactulose and repeat ammonia levels in am are normal. Resume depakote .  Resume lasix    HCC: Pt refusing to see an oncologist at this time. Knows the prognosis . Palliative care consulted and recommendations given.  COPD: No wheezing heard.resume symbicort     PANCYTOPENIA: Probably from the hepatic cirrhosis. Continue to monitor. No evidence of bleeding.   Code Status: DNR Family Communication:daughter  at bedside Disposition Plan: pending.    Consultants:  Psychiatry.   Palliative care consult.   Procedures:  none  Antibiotics:  none  HPI/Subjective: Requesting hemorroidal cream.  Objective: Filed Vitals:   05/05/14 0543  BP: 109/73  Pulse: 81  Temp: 98.5 F (36.9 C)  Resp: 20    Intake/Output Summary (Last 24 hours) at 05/05/14 1136 Last data filed at 05/05/14 0259  Gross per 24 hour  Intake    360 ml  Output      0 ml  Net    360 ml   Filed Weights   05/03/14 0113  Weight: 57.97 kg (127 lb 12.8 oz)    Exam:   General:  Alert afebrile laying in the bed.  Cardiovascular: s1s2  Respiratory: cchest clear to auscultation , no wheezing or rhonchi  Abdomen: soft tender in the right side, bowel sounds heard  Musculoskeletal: no pedal edema.   Data Reviewed: Basic Metabolic Panel:  Recent Labs Lab 05/01/14 1937 05/02/14 2159 05/03/14 0700 05/04/14 1135 05/05/14 0423  NA 139 141 145 141 142  K 3.7 3.6* 3.4* 3.8 3.3*  CL 101 103 109 106 105  CO2 27 25 25 25 27   GLUCOSE 112* 82 78 96 115*  BUN 14 16 15 12 9   CREATININE 0.98 0.84 0.79 0.82 0.72  CALCIUM 9.5 9.6 9.1 9.2 9.0   Liver Function Tests:  Recent Labs Lab 05/01/14 1937 05/02/14 2159 05/03/14 0700 05/05/14 0423  AST 82* 89* 75* 92*  ALT 23 24 20 26   ALKPHOS 161*  177* 155* 196*  BILITOT 0.9 1.2 1.0 0.7  PROT 7.6 8.0 7.0 6.7  ALBUMIN 2.8* 2.9* 2.6* 2.4*    Recent Labs Lab 05/01/14 1937 05/02/14 2159  LIPASE 21 14    Recent Labs Lab 05/02/14 0055 05/02/14 2159 05/03/14 0220 05/05/14 0423  AMMONIA 39 45 90* 55   CBC:  Recent Labs Lab 05/01/14 1937 05/02/14 2159 05/03/14 0700 05/05/14 0423  WBC 1.9* 4.6 3.5* 2.6*  NEUTROABS 1.2* 3.5 2.2  --   HGB 11.0* 12.5 11.7* 10.7*  HCT 33.2* 37.7 34.7* 31.8*  MCV  94.9 94.0 93.8 94.4  PLT 56* 53* 61* 65*   Cardiac Enzymes:  Recent Labs Lab 05/02/14 2159 05/03/14 0220 05/03/14 0700 05/03/14 1325  TROPONINI <0.30 <0.30 <0.30 <0.30   BNP (last 3 results) No results found for this basename: PROBNP,  in the last 8760 hours CBG: No results found for this basename: GLUCAP,  in the last 168 hours  Recent Results (from the past 240 hour(s))  URINE CULTURE     Status: None   Collection Time    05/02/14 12:50 AM      Result Value Ref Range Status   Specimen Description URINE, CLEAN CATCH   Final   Special Requests NONE   Final   Culture  Setup Time     Final   Value: 05/02/2014 05:05     Performed at [place]     Final   Value: NO GROWTH     Performed at [facility]   Culture     Final   Value: NO GROWTH     Performed at [facility]   Report Status 05/03/2014 FINAL   Final     Studies: No results found.  Scheduled Meds: . budesonide-formoterol  2 puff Inhalation BID  . clonazePAM  2 mg Oral BID  . colchicine  0.6 mg Oral Q breakfast  . feeding supplement (ENSURE COMPLETE)  237 mL Oral BID BM  . furosemide  20 mg Oral q morning - 10a  . gabapentin  300 mg Oral TID  . hydrochlorothiazide  25 mg Oral QPM  . hydrocortisone   Rectal TID  . lactulose  20 g Oral BID  . megestrol  40 mg Oral Daily  . multivitamin with minerals  1 tablet Oral Daily  . nicotine  21 mg Transdermal Daily  . pantoprazole  40 mg Oral Daily  . potassium chloride  40 mEq Oral BID  . sodium chloride  3 mL Intravenous Q12H  . sodium chloride  3 mL Intravenous Q12H   Continuous Infusions:   Principal Problem:   Abdominal pain Active Problems:   Cancer, hepatocellular   Chest pain   Cocaine use    Time spent: 25 minutes.     [place] [facility] Pager [phone]. If 7PM-7AM, please contact night-coverage at [email or web address], password [facility] 05/05/2014, 11:36 AM  LOS: 3 days

## 2014-05-06 DIAGNOSIS — F319 Bipolar disorder, unspecified: Secondary | ICD-10-CM

## 2014-05-06 NOTE — Progress Notes (Signed)
[facility] PROGRESS NOTE  SONAKSHI ROLLAND WIO:[id number] DOB: 08-Mar-1956 DOA: 05/02/2014 PCP: Barbette Merino, MD Interim summary: Dorothy Burns is a 58 y.o. female with history of hepatocellular carcinoma, hepatitis C, cirrhosis of liver, COPD, bipolar disorder who had arterial embolization of the hypervascular mass last month presents to the ER because of persistent abdominal pain with nausea and also has complaint of chest pain. Patient also had come to the ER 2 days ago with similar complaints but was discharged home. Patient states pain is not improving and also at this time complained of chest pain which was retrosternal burning sensation persistent. Patient's urine drug screen is positive for cocaine. Patient also endorsed suicidal ideations. She was admitted to medical service for further evaluation. She was supposed to follow up with Dr Alvy Bimler and Dr watts with IR as outpatient and she never did. She was given prognosis of 6 months if no treatment for the [facility] by Dr Alvy Bimler. Palliative care consulted and recommendations given. Spoke to her daughter who wanted her mother to go to ALF or possibly SNF on discharge. PT/OT consulted.   Assessment/Plan: Abdominal pain with history of hepatocellular carcinoma and recent embolization of the hypervascular mass - Pain control and outpatient follow up with Dr Pascal Lux for follow up embolization.   Chest pain; resolved. Cardiac enzymes negative. EKG non specific t wave changes ACS ruled out.   Bipolar disorder and suicidal ideations: Continue home medications and psychiatry consulted for recommendations.   Liver cirrhosis and hepatitis C,: Elevated ammonia levels, on admission, started lactulose and repeat ammonia levels in am are normal. Resume depakote .  Resume lasix    HCC: Pt refusing to see an oncologist at this time. Knows the prognosis . Palliative care consulted and recommendations given.  COPD: No wheezing heard.resume symbicort     PANCYTOPENIA: Probably from the hepatic cirrhosis. Continue to monitor. No evidence of bleeding.   Code Status: DNR Family Communication:daughter  at bedside Disposition Plan: pending.    Consultants:  Psychiatry.   Palliative care consult.   Procedures:  none  Antibiotics:  none  HPI/Subjective: Marland Kitchen Very frustrated this am   Objective: Filed Vitals:   05/06/14 1511  BP: 124/76  Pulse: 103  Temp: 98.3 F (36.8 C)  Resp: 20    Intake/Output Summary (Last 24 hours) at 05/06/14 1600 Last data filed at 05/05/14 1747  Gross per 24 hour  Intake    120 ml  Output      0 ml  Net    120 ml   Filed Weights   05/03/14 0113 05/06/14 0556  Weight: 57.97 kg (127 lb 12.8 oz) 60.646 kg (133 lb 11.2 oz)    Exam:   General:  Alert afebrile laying in the bed.  Cardiovascular: s1s2  Respiratory: cchest clear to auscultation , no wheezing or rhonchi  Abdomen: soft tender in the right side, bowel sounds heard  Musculoskeletal: no pedal edema.   Data Reviewed: Basic Metabolic Panel:  Recent Labs Lab 05/01/14 1937 05/02/14 2159 05/03/14 0700 05/04/14 1135 05/05/14 0423  NA 139 141 145 141 142  K 3.7 3.6* 3.4* 3.8 3.3*  CL 101 103 109 106 105  CO2 27 25 25 25 27   GLUCOSE 112* 82 78 96 115*  BUN 14 16 15 12 9   CREATININE 0.98 0.84 0.79 0.82 0.72  CALCIUM 9.5 9.6 9.1 9.2 9.0   Liver Function Tests:  Recent Labs Lab 05/01/14 1937 05/02/14 2159 05/03/14 0700 05/05/14 0423  AST 82* 89*  75* 92*  ALT 23 24 20 26   ALKPHOS 161* 177* 155* 196*  BILITOT 0.9 1.2 1.0 0.7  PROT 7.6 8.0 7.0 6.7  ALBUMIN 2.8* 2.9* 2.6* 2.4*    Recent Labs Lab 05/01/14 1937 05/02/14 2159  LIPASE 21 14    Recent Labs Lab 05/02/14 0055 05/02/14 2159 05/03/14 0220 05/05/14 0423  AMMONIA 39 45 90* 55   CBC:  Recent Labs Lab 05/01/14 1937 05/02/14 2159 05/03/14 0700 05/05/14 0423  WBC 1.9* 4.6 3.5* 2.6*  NEUTROABS 1.2* 3.5 2.2  --   HGB 11.0* 12.5 11.7*  10.7*  HCT 33.2* 37.7 34.7* 31.8*  MCV 94.9 94.0 93.8 94.4  PLT 56* 53* 61* 65*   Cardiac Enzymes:  Recent Labs Lab 05/02/14 2159 05/03/14 0220 05/03/14 0700 05/03/14 1325  TROPONINI <0.30 <0.30 <0.30 <0.30   BNP (last 3 results) No results found for this basename: PROBNP,  in the last 8760 hours CBG: No results found for this basename: GLUCAP,  in the last 168 hours  Recent Results (from the past 240 hour(s))  URINE CULTURE     Status: None   Collection Time    05/02/14 12:50 AM      Result Value Ref Range Status   Specimen Description URINE, CLEAN CATCH   Final   Special Requests NONE   Final   Culture  Setup Time     Final   Value: 05/02/2014 05:05     Performed at [place]     Final   Value: NO GROWTH     Performed at [facility]   Culture     Final   Value: NO GROWTH     Performed at [facility]   Report Status 05/03/2014 FINAL   Final     Studies: No results found.  Scheduled Meds: . budesonide-formoterol  2 puff Inhalation BID  . clonazePAM  2 mg Oral BID  . colchicine  0.6 mg Oral Q breakfast  . divalproex  250 mg Oral Q12H  . feeding supplement (ENSURE COMPLETE)  237 mL Oral BID BM  . furosemide  20 mg Oral q morning - 10a  . gabapentin  300 mg Oral TID  . hydrochlorothiazide  25 mg Oral QPM  . hydrocortisone   Rectal TID  . lactulose  20 g Oral BID  . megestrol  40 mg Oral Daily  . multivitamin with minerals  1 tablet Oral Daily  . nicotine  21 mg Transdermal Daily  . pantoprazole  40 mg Oral Daily  . sodium chloride  3 mL Intravenous Q12H  . sodium chloride  3 mL Intravenous Q12H   Continuous Infusions:   Principal Problem:   Abdominal pain Active Problems:   Cancer, hepatocellular   Chest pain   Cocaine use    Time spent: 25 minutes.     [place] [facility] Pager [phone]. If 7PM-7AM, please contact night-coverage at [email or web address], password [facility] 05/06/2014, 4:00 PM  LOS:  4 days

## 2014-05-07 DIAGNOSIS — E43 Unspecified severe protein-calorie malnutrition: Secondary | ICD-10-CM

## 2014-05-07 DIAGNOSIS — K649 Unspecified hemorrhoids: Secondary | ICD-10-CM

## 2014-05-07 DIAGNOSIS — R630 Anorexia: Secondary | ICD-10-CM

## 2014-05-07 LAB — BASIC METABOLIC PANEL
Anion gap: 12 (ref 5–15)
BUN: 11 mg/dL (ref 6–23)
CO2: 26 meq/L (ref 19–32)
CREATININE: 0.71 mg/dL (ref 0.50–1.10)
Calcium: 8.7 mg/dL (ref 8.4–10.5)
Chloride: 101 mEq/L (ref 96–112)
GFR calc Af Amer: >90 mL/min (ref >90)
GLUCOSE: 163 mg/dL — AB (ref 70–99)
Potassium: 3.7 mEq/L (ref 3.7–5.3)
Sodium: 139 mEq/L (ref 137–147)

## 2014-05-07 MED ORDER — MEGESTROL ACETATE 400 MG/10ML PO SUSP
400.0000 mg | Freq: Every day | ORAL | Status: DC
  Administered 2014-05-07 – 2014-05-09 (×3): 400 mg via ORAL
  Filled 2014-05-07 (×3): qty 10

## 2014-05-07 NOTE — Progress Notes (Signed)
[facility] PROGRESS NOTE  MORGANN WOODBURN CWC:[id number] DOB: [birth year] DOA: 05/02/2014 PCP: Barbette Merino, MD Interim summary: Dorothy Burns is a 58 y.o. female with history of hepatocellular carcinoma, hepatitis C, cirrhosis of liver, COPD, bipolar disorder who had arterial embolization of the hypervascular mass last month presents to the ER because of persistent abdominal pain with nausea and also has complaint of chest pain. Patient also had come to the ER 2 days ago with similar complaints but was discharged home. Patient states pain is not improving and also at this time complained of chest pain which was retrosternal burning sensation persistent. Patient's urine drug screen is positive for cocaine. Patient also endorsed suicidal ideations. She was admitted to medical service for further evaluation. She was supposed to follow up with Dr Alvy Bimler and Dr watts with IR as outpatient and she never did. She was given prognosis of 6 months if no treatment for the [facility] by Dr Alvy Bimler. Palliative care consulted and recommendations given. Spoke to her daughter who wanted her mother to go to ALF or possibly SNF on discharge. PT/OT consulted.   Assessment/Plan: Abdominal pain with history of hepatocellular carcinoma and recent embolization of the hypervascular mass - Pain control and outpatient follow up with Dr Pascal Lux for follow up embolization.   Chest pain; resolved. Cardiac enzymes negative. EKG non specific t wave changes ACS ruled out.   Bipolar disorder and suicidal ideations: Continue home medications and psychiatry consulted for recommendations. Will d/c sitter as she is not endorsing any suicidal ideations.   Liver cirrhosis and hepatitis C,: Elevated ammonia levels, on admission, started lactulose and repeat ammonia levels in am are normal. Resume depakote .  Resume lasix    HCC: Pt refusing to see an oncologist at this time. Knows the prognosis . Palliative care consulted and  recommendations given.  COPD: No wheezing heard.resume symbicort    PANCYTOPENIA: Probably from the hepatic cirrhosis. Continue to monitor. No evidence of bleeding.   Hypokalemia: repleted as needed.  Repeat in am.   Code Status: DNR Family Communication:daughter  at bedside Disposition Plan: pending.    Consultants:  Psychiatry.   Palliative care consult.   Procedures:  none  Antibiotics:  none  HPI/Subjective: Calm and cheerful.  Objective: Filed Vitals:   05/07/14 1520  BP: 119/72  Pulse: 90  Temp: 98.1 F (36.7 C)  Resp: 17    Intake/Output Summary (Last 24 hours) at 05/07/14 1550 Last data filed at 05/07/14 1201  Gross per 24 hour  Intake    480 ml  Output      0 ml  Net    480 ml   Filed Weights   05/03/14 0113 05/06/14 0556  Weight: 57.97 kg (127 lb 12.8 oz) 60.646 kg (133 lb 11.2 oz)    Exam:   General:  Alert afebrile talking and cheerful.  Cardiovascular: s1s2  Respiratory: cchest clear to auscultation , no wheezing or rhonchi  Abdomen: soft tender in the right side, bowel sounds heard  Musculoskeletal: no pedal edema.   Data Reviewed: Basic Metabolic Panel:  Recent Labs Lab 05/01/14 1937 05/02/14 2159 05/03/14 0700 05/04/14 1135 05/05/14 0423  NA 139 141 145 141 142  K 3.7 3.6* 3.4* 3.8 3.3*  CL 101 103 109 106 105  CO2 27 25 25 25 27   GLUCOSE 112* 82 78 96 115*  BUN 14 16 15 12 9   CREATININE 0.98 0.84 0.79 0.82 0.72  CALCIUM 9.5 9.6 9.1 9.2 9.0   Liver Function  Tests:  Recent Labs Lab 05/01/14 1937 05/02/14 2159 05/03/14 0700 05/05/14 0423  AST 82* 89* 75* 92*  ALT 23 24 20 26   ALKPHOS 161* 177* 155* 196*  BILITOT 0.9 1.2 1.0 0.7  PROT 7.6 8.0 7.0 6.7  ALBUMIN 2.8* 2.9* 2.6* 2.4*    Recent Labs Lab 05/01/14 1937 05/02/14 2159  LIPASE 21 14    Recent Labs Lab 05/02/14 0055 05/02/14 2159 05/03/14 0220 05/05/14 0423  AMMONIA 39 45 90* 55   CBC:  Recent Labs Lab 05/01/14 1937  05/02/14 2159 05/03/14 0700 05/05/14 0423  WBC 1.9* 4.6 3.5* 2.6*  NEUTROABS 1.2* 3.5 2.2  --   HGB 11.0* 12.5 11.7* 10.7*  HCT 33.2* 37.7 34.7* 31.8*  MCV 94.9 94.0 93.8 94.4  PLT 56* 53* 61* 65*   Cardiac Enzymes:  Recent Labs Lab 05/02/14 2159 05/03/14 0220 05/03/14 0700 05/03/14 1325  TROPONINI <0.30 <0.30 <0.30 <0.30   BNP (last 3 results) No results found for this basename: PROBNP,  in the last 8760 hours CBG: No results found for this basename: GLUCAP,  in the last 168 hours  Recent Results (from the past 240 hour(s))  URINE CULTURE     Status: None   Collection Time    05/02/14 12:50 AM      Result Value Ref Range Status   Specimen Description URINE, CLEAN CATCH   Final   Special Requests NONE   Final   Culture  Setup Time     Final   Value: 05/02/2014 05:05     Performed at [place]     Final   Value: NO GROWTH     Performed at [facility]   Culture     Final   Value: NO GROWTH     Performed at [facility]   Report Status 05/03/2014 FINAL   Final     Studies: No results found.  Scheduled Meds: . budesonide-formoterol  2 puff Inhalation BID  . clonazePAM  2 mg Oral BID  . colchicine  0.6 mg Oral Q breakfast  . divalproex  250 mg Oral Q12H  . feeding supplement (ENSURE COMPLETE)  237 mL Oral BID BM  . furosemide  20 mg Oral q morning - 10a  . gabapentin  300 mg Oral TID  . hydrochlorothiazide  25 mg Oral QPM  . hydrocortisone   Rectal TID  . lactulose  20 g Oral BID  . megestrol  400 mg Oral Daily  . multivitamin with minerals  1 tablet Oral Daily  . nicotine  21 mg Transdermal Daily  . pantoprazole  40 mg Oral Daily  . sodium chloride  3 mL Intravenous Q12H  . sodium chloride  3 mL Intravenous Q12H   Continuous Infusions:   Principal Problem:   Abdominal pain Active Problems:   Cancer, hepatocellular   Chest pain   Cocaine use    Time spent: 25 minutes.     [place]  [facility] Pager [phone]. If 7PM-7AM, please contact night-coverage at [email or web address], password [facility] 05/07/2014, 3:50 PM  LOS: 5 days

## 2014-05-07 NOTE — Progress Notes (Signed)
Patient [id number] Dorothy Burns      DOB: Jan 26, 1956      HEN:[id number]   Palliative Medicine Team at [facility] Progress Note    Subjective: Feeling much better today.  Mood greatly improved. Has positive outlook on potential going to [facility].  Pain better contrlled with oxycodone. Hemorrhoidal pain being helped by anusol.  Appetite still poor. No nausea.     Filed Vitals:   05/07/14 0500  BP: 114/83  Pulse: 92  Temp: 98.3 F (36.8 C)  Resp: 18   Physical exam: Gen: alert, NAD  CV: RRR  LUNGS: CTA  Abd; Soft, ND  EXT: no edema    Assessment and plan: 58 yo female with PMHx of HCC s/p embolization, Hep C cirrhosis, cocaine abuse, bipolar who presented with back/abd pain, possible SI.  1. Code Status: DNR   2. Goals of Care:  See previous documentation.  She is hopeful to go to [facility]. If she can go there with hospice care, that would be her first preference. If unable to arrange for ECF with hospice then would have outpatient palliative care services follow for support, symptom management, and identifying time to transition to inpatient hospice facility. Appreciate SW assistance.   3. Symptom Management:  1. Pain- better. Continue PRN oxycodone. i will d/c IV morphine 2. Constipation- Cont lactulose 3. Loss of Appetite- started on megace. I will increase dose to 400mg /day as it usually takes higher dosages to really stimulate appetite.  4. Hemorrhoids- anusol helping.  5.  4. Psychosocial/Spiritual: Lives alone with some support from neighbor and daughter Dorothy Burns (though relationship a bit strained). Has 5 children all together. Recently started going to [employer] again. Poor social support network with ongoing cocaine abuse. Appreciate chaplain consult. Her Father Dorothy Burns is again at bedside.   Doran Clay D.O. Palliative Medicine Team at [facility]  Pager: [phone] Team Phone: [phone]

## 2014-05-07 NOTE — Progress Notes (Signed)
Pt sitter discontinued. Francis Gaines Merriel Zinger RN.

## 2014-05-08 DIAGNOSIS — F1414 Cocaine abuse with cocaine-induced mood disorder: Secondary | ICD-10-CM

## 2014-05-08 MED ORDER — POLYETHYLENE GLYCOL 3350 17 G PO PACK: 17.0000 g | PACK | Freq: Every day | ORAL | Status: DC

## 2014-05-08 MED ORDER — LACTULOSE 10 GM/15ML PO SOLN: 20.0000 g | Freq: Two times a day (BID) | ORAL | Status: DC

## 2014-05-08 MED ORDER — POLYETHYLENE GLYCOL 3350 17 G PO PACK
17.0000 g | PACK | Freq: Every day | ORAL | Status: DC
  Administered 2014-05-08 – 2014-05-09 (×2): 17 g via ORAL
  Filled 2014-05-08 (×2): qty 1

## 2014-05-08 MED ORDER — DOCUSATE SODIUM 100 MG PO CAPS
200.0000 mg | ORAL_CAPSULE | Freq: Two times a day (BID) | ORAL | Status: DC
  Administered 2014-05-08 – 2014-05-09 (×2): 200 mg via ORAL
  Filled 2014-05-08 (×4): qty 2

## 2014-05-08 MED ORDER — OXYCODONE HCL 5 MG PO CAPS: 5.0000 mg | ORAL_CAPSULE | ORAL | Status: DC | PRN

## 2014-05-08 MED ORDER — HALOPERIDOL LACTATE 5 MG/ML IJ SOLN
2.0000 mg | Freq: Four times a day (QID) | INTRAMUSCULAR | Status: DC | PRN
  Administered 2014-05-08: 2 mg via INTRAVENOUS
  Filled 2014-05-08: qty 0.4

## 2014-05-08 MED ORDER — MEGESTROL ACETATE 400 MG/10ML PO SUSP: 400.0000 mg | Freq: Every day | ORAL | Status: DC

## 2014-05-08 MED ORDER — HYDROCORTISONE 2.5 % RE CREA: TOPICAL_CREAM | Freq: Three times a day (TID) | RECTAL | Status: DC

## 2014-05-08 MED ORDER — NICOTINE 21 MG/24HR TD PT24: 21.0000 mg | MEDICATED_PATCH | Freq: Every day | TRANSDERMAL | Status: AC

## 2014-05-08 NOTE — Discharge Summary (Signed)
Physician Discharge Summary  ETHER GOEBEL HQI:[id number] DOB: March 04, 1956 DOA: 05/02/2014  PCP: Barbette Merino, MD  Admit date: 05/02/2014 Discharge date: 05/08/2014  Time spent: 30 minutes  Recommendations for Outpatient Follow-up:  1. followup with PCP in one week  Discharge Diagnoses:  Principal Problem:   Abdominal pain Active Problems:   Cancer, hepatocellular   Chest pain   Cocaine use   Discharge Condition: improved.   Diet recommendation: regular diet.   Filed Weights   05/03/14 0113 05/06/14 0556  Weight: 57.97 kg (127 lb 12.8 oz) 60.646 kg (133 lb 11.2 oz)    History of present illness: Hospital Course:  Dorothy Burns is a 58 y.o. female with history of hepatocellular carcinoma, hepatitis C, cirrhosis of liver, COPD, bipolar disorder who had arterial embolization of the hypervascular mass last month presents to the ER because of persistent abdominal pain with nausea and also has complaint of chest pain. Patient also had come to the ER 2 days ago with similar complaints but was discharged home. Patient states pain is not improving and also at this time complained of chest pain which was retrosternal burning sensation persistent. Patient's urine drug screen is positive for cocaine. Patient also endorsed suicidal ideations. She was admitted to medical service for further evaluation. She was supposed to follow up with Dr Alvy Bimler and Dr watts with IR as outpatient and she never did. She was given prognosis of 6 months if no treatment for the [facility] by Dr Alvy Bimler. Palliative care consulted and recommendations given. Spoke to her daughter who wanted her mother to go to ALF or possibly SNF on discharge. PT/OT consulted.  Assessment/Plan:  Abdominal pain with history of hepatocellular carcinoma and recent embolization of the hypervascular mass - Pain control and outpatient follow up with Dr Pascal Lux for follow up embolization.  Chest pain; resolved. Cardiac enzymes negative. EKG non  specific t wave changes ACS ruled out.  Bipolar disorder and suicidal ideations:  Continue home medications and psychiatry consulted for recommendations. Will d/c sitter as she is not endorsing any suicidal ideations.  Liver cirrhosis and hepatitis C,:  Elevated ammonia levels, on admission, started lactulose and repeat ammonia levels in am are normal. Resume depakote .  Resume lasix  HCC:  Pt refusing to see an oncologist at this time. Knows the prognosis . Palliative care consulted and recommendations given.  COPD:  No wheezing heard.resume symbicort  PANCYTOPENIA:  Probably from the hepatic cirrhosis. Continue to monitor. No evidence of bleeding.  Hypokalemia: repleted as needed.    Procedures:  none  Consultations:  psychaitry  Palliative   Discharge Exam: Filed Vitals:   05/08/14 1100  BP: 117/91  Pulse: 92  Temp: 98.2 F (36.8 C)  Resp: 18    General: alert afebrile comfortable Cardiovascular: s1s2 Respiratory: ctab  Discharge Instructions You were cared for by a hospitalist during your hospital stay. If you have any questions about your discharge medications or the care you received while you were in the hospital after you are discharged, you can call the unit and asked to speak with the hospitalist on call if the hospitalist that took care of you is not available. Once you are discharged, your primary care physician will handle any further medical issues. Please note that NO REFILLS for any discharge medications will be authorized once you are discharged, as it is imperative that you return to your primary care physician (or establish a relationship with a primary care physician if you do not have one) for  your aftercare needs so that they can reassess your need for medications and monitor your lab values.   Current Discharge Medication List    START taking these medications   Details  hydrocortisone (ANUSOL-HC) 2.5 % rectal cream Place rectally 3 (three) times  daily. Qty: 30 g, Refills: 0    lactulose (CHRONULAC) 10 GM/15ML solution Take 30 mLs (20 g total) by mouth 2 (two) times daily. Qty: 240 mL, Refills: 0    megestrol (MEGACE) 400 MG/10ML suspension Take 10 mLs (400 mg total) by mouth daily. Qty: 240 mL, Refills: 0    nicotine (NICODERM CQ - DOSED IN MG/24 HOURS) 21 mg/24hr patch Place 1 patch (21 mg total) onto the skin daily. Qty: 28 patch, Refills: 0    polyethylene glycol (MIRALAX / GLYCOLAX) packet Take 17 g by mouth daily. Qty: 14 each, Refills: 0      CONTINUE these medications which have CHANGED   Details  oxycodone (OXY-IR) 5 MG capsule Take 1 capsule (5 mg total) by mouth every 4 (four) hours as needed for pain. Qty: 30 capsule, Refills: 0      CONTINUE these medications which have NOT CHANGED   Details  aspirin 81 MG tablet Take 81 mg by mouth daily.     budesonide-formoterol (SYMBICORT) 160-4.5 MCG/ACT inhaler Inhale 2 puffs into the lungs 2 (two) times daily.    clonazePAM (KLONOPIN) 2 MG tablet Take 2 mg by mouth 2 (two) times daily.    colchicine 0.6 MG tablet Take 0.6 mg by mouth daily with breakfast.     Cyanocobalamin (B-12 PO) Take 1 capsule by mouth daily with breakfast.     divalproex (DEPAKOTE) 250 MG DR tablet Take 250 mg by mouth 2 (two) times daily.    furosemide (LASIX) 20 MG tablet Take 20 mg by mouth every morning.     gabapentin (NEURONTIN) 300 MG capsule Take 300 mg by mouth 3 (three) times daily.   Associated Diagnoses: Other pancytopenia; Weight loss; Elevated liver function tests    hydrochlorothiazide (HYDRODIURIL) 25 MG tablet Take 25 mg by mouth every evening.    methocarbamol (ROBAXIN) 500 MG tablet Take 1 tablet (500 mg total) by mouth every 8 (eight) hours as needed for muscle spasms (back pain). Qty: 40 tablet, Refills: 0    Multiple Vitamin (MULTIVITAMIN WITH MINERALS) TABS tablet Take 1 tablet by mouth daily.    omeprazole (PRILOSEC) 20 MG capsule Take 20 mg by mouth daily with  breakfast.     Thiamine HCl (B-1 PO) Take 1 capsule by mouth daily.       Allergies  Allergen Reactions  . Nsaids Other (See Comments)    Liver issues   . Tylenol [Acetaminophen] Other (See Comments)    Liver issues      The results of significant diagnostics from this hospitalization (including imaging, microbiology, ancillary and laboratory) are listed below for reference.    Significant Diagnostic Studies: Dg Chest 2 View  05/02/2014   CLINICAL DATA:  Abdominal pain.  Initial encounter.  EXAM: CHEST  2 VIEW  COMPARISON:  12/29/2013  FINDINGS: No cardiomegaly. No definite contour abnormality of the aorta (apparent posterior lobulation of the descending thoracic aorta in the lateral projection is not confirmed frontally or on recent thoracic spine imaging). There is no edema, consolidation, effusion, or pneumothorax. T12 compression deformity noted on previous thoracic spine imaging.  IMPRESSION: No active cardiopulmonary disease.   Electronically Signed   By: Jorje Guild M.D.   On: 05/02/2014  23:55   Dg Thoracic Spine 2 View  05/02/2014   CLINICAL DATA:  Abdominal and back pain after falling over stump. Initial encounter.  EXAM: THORACIC SPINE - 2 VIEW  COMPARISON:  11/21/2013  FINDINGS: There is a mild (25% or less) compression deformity of the T12 vertebral body consistent with compression fracture. The fracture is new from 04/10/2014. No subluxation or evidence of retropulsion. Diffuse degenerative endplate changes without focal/notable level.  IMPRESSION: T12 compression fracture with mild height loss.   Electronically Signed   By: Jorje Guild M.D.   On: 05/02/2014 01:38   Dg Lumbar Spine Complete  05/02/2014   CLINICAL DATA:  Abdominal and back pain after recent fall. Initial encounter.  EXAM: LUMBAR SPINE - COMPLETE 4+ VIEW  COMPARISON:  04/10/2014 abdominal CT  FINDINGS: There is mild (approximately 25%) anterior wedging of the T12 vertebral body with irregularity of the  superior endplate that is new from 04/10/2014 and consistent with compression fracture. No posterior retropulsion or subluxation.  No lumbar spine fracture or subluxation. Degenerative overgrowth of the lower lumbar facets, especially L5-S1 on the right.  IMPRESSION: T12 compression fracture with mild height loss.   Electronically Signed   By: Jorje Guild M.D.   On: 05/02/2014 01:40   Ct Head Wo Contrast  04/25/2014   CLINICAL DATA:  Initial encounter for fall from bed and a nursing facility. No loss of consciousness. Persistent weakness and dizziness. The fall was 4 days ago. Unknown loss of consciousness.  EXAM: CT HEAD WITHOUT CONTRAST  TECHNIQUE: Contiguous axial images were obtained from the base of the skull through the vertex without intravenous contrast.  COMPARISON:  CT head without contrast 12/01/2013.  FINDINGS: Mild generalized atrophy and white matter disease stable. This is advanced for age. Ventricles are proportionate to the degree of atrophy. No significant extra-axial fluid collection is present. Postsurgical changes of the right maxilla and lateral orbit are again seen. Remote orbital wall fracture is noted.  No focal extracranial soft tissue injury is evident. The paranasal sinuses and mastoid air cells are clear. The osseous skull is otherwise intact.  IMPRESSION: 1. Stable atrophy and white matter disease. 2. No acute intracranial abnormality. 3. Postsurgical changes of the right maxilla and right lateral orbit.   Electronically Signed   By: Lawrence Santiago M.D.   On: 04/25/2014 23:57   Ct Abdomen Pelvis W Contrast  04/11/2014   CLINICAL DATA:  Abdominal pain in multiple areas, including right upper quadrant.  EXAM: CT ABDOMEN AND PELVIS WITH CONTRAST  TECHNIQUE: Multidetector CT imaging of the abdomen and pelvis was performed using the standard protocol following bolus administration of intravenous contrast.  CONTRAST:  150mL OMNIPAQUE IOHEXOL 300 MG/ML  SOLN  COMPARISON:  12/22/2013   FINDINGS: BODY WALL: Unremarkable.  LOWER CHEST: Unremarkable.  ABDOMEN/PELVIS:  Liver: Liver cirrhosis with small lobulated liver, fissure enlargement, and recannulated periumbilical vein. Two enhancing masses consistent with hepatocellular carcinoma. A 4.8 cm mass in segment eight appears larger than on December 23, 2013 abdominal CT, but stable in size compared to 12/15/2013 MRI. Differences on CT may be related to imaging phase. The recently treated left hepatic hepatic cellular carcinoma has a shrunken enhancing portion but new medial in inferiorly directed cavitary portion which measures 5.3 cm. The collection contains a few bubbles of anti dependent gas. There is a mildly enhancing capsule around this fluid component. The fluid collection has mass effect on the upper stomach, without definitive fistulization based on continuity of the gastric  mucosa. Stable adenopathy in the deep liver drainage, occluding the porta hepatis and portacaval stations. Lower periesophageal varices.  Biliary: Cholecystectomy. No intrahepatic biliary ductal dilatation suggestive of biliary necrosis.  Pancreas: Unremarkable.  Spleen: Splenomegaly in the setting of portal hypertension.  Adrenals: Unremarkable.  Kidneys and ureters: No hydronephrosis or stone.  Bladder: Unremarkable.  Reproductive: Stable appearance of the uterus and ovaries.  Bowel: No obstruction.  Retroperitoneum: No mass or adenopathy.  Peritoneum: No ascites or pneumoperitoneum.  Vascular: No acute abnormality.  OSSEOUS: No acute abnormalities.  IMPRESSION: 1. Cirrhosis with 2 hepatocellular carcinomas. New 5 cm fluid and gas collection associated with recently treated left lobe mass which could represent abscess, sterile necrosis, or biloma. 2. Stable adenopathy in the deep liver drainage. 3. Portal hypertension with lower esophageal varices.   Electronically Signed   By: Jorje Guild M.D.   On: 04/11/2014 00:00   Dg Shoulder Left  04/26/2014   CLINICAL DATA:   Initial encounter for fall at nursing home. The patient is unsure she struck anything. The fall was 4 days ago. Anterior left shoulder pain.  EXAM: LEFT SHOULDER - 2+ VIEW  COMPARISON:  Left shoulder radiographs 06/11/2009.  FINDINGS: A remote left clavicle fracture is again seen. No acute fracture is present. The left shoulder is located. The left hemi thorax is clear.  IMPRESSION: 1. No acute abnormality or significant interval change. 2. Remote left clavicle fracture.   Electronically Signed   By: Lawrence Santiago M.D.   On: 04/26/2014 00:01    Microbiology: Recent Results (from the past 240 hour(s))  URINE CULTURE     Status: None   Collection Time    05/02/14 12:50 AM      Result Value Ref Range Status   Specimen Description URINE, CLEAN CATCH   Final   Special Requests NONE   Final   Culture  Setup Time     Final   Value: 05/02/2014 05:05     Performed at [facility]     Final   Value: NO GROWTH     Performed at [facility]   Culture     Final   Value: NO GROWTH     Performed at [facility]   Report Status 05/03/2014 FINAL   Final     Labs: Basic Metabolic Panel:  Recent Labs Lab 05/02/14 2159 05/03/14 0700 05/04/14 1135 05/05/14 0423 05/07/14 1715  NA 141 145 141 142 139  K 3.6* 3.4* 3.8 3.3* 3.7  CL 103 109 106 105 101  CO2 25 25 25 27 26   GLUCOSE 82 78 96 115* 163*  BUN 16 15 12 9 11   CREATININE 0.84 0.79 0.82 0.72 0.71  CALCIUM 9.6 9.1 9.2 9.0 8.7   Liver Function Tests:  Recent Labs Lab 05/01/14 1937 05/02/14 2159 05/03/14 0700 05/05/14 0423  AST 82* 89* 75* 92*  ALT 23 24 20 26   ALKPHOS 161* 177* 155* 196*  BILITOT 0.9 1.2 1.0 0.7  PROT 7.6 8.0 7.0 6.7  ALBUMIN 2.8* 2.9* 2.6* 2.4*    Recent Labs Lab 05/01/14 1937 05/02/14 2159  LIPASE 21 14    Recent Labs Lab 05/02/14 0055 05/02/14 2159 05/03/14 0220 05/05/14 0423  AMMONIA 39 45 90* 55   CBC:  Recent Labs Lab 05/01/14 1937 05/02/14 2159  05/03/14 0700 05/05/14 0423  WBC 1.9* 4.6 3.5* 2.6*  NEUTROABS 1.2* 3.5 2.2  --   HGB 11.0* 12.5 11.7* 10.7*  HCT  33.2* 37.7 34.7* 31.8*  MCV 94.9 94.0 93.8 94.4  PLT 56* 53* 61* 65*   Cardiac Enzymes:  Recent Labs Lab 05/02/14 2159 05/03/14 0220 05/03/14 0700 05/03/14 1325  TROPONINI <0.30 <0.30 <0.30 <0.30   BNP: BNP (last 3 results) No results found for this basename: PROBNP,  in the last 8760 hours CBG: No results found for this basename: GLUCAP,  in the last 168 hours     Signed:  Marlowe Cinquemani  [facility] 05/08/2014, 2:16 PM

## 2014-05-08 NOTE — Clinical Social Work Note (Signed)
CSW rec'd call from [facility] indicating some error in paperwork that was sent to them- they are currently waiting review of correct information and word from their Administrator- family to be updated- d/c on hold until further word rec'd.   Eduard Clos, MSW, [place]

## 2014-05-08 NOTE — Clinical Social Work Note (Addendum)
Patient to be d/c'ed today to [place], [place].  Patient and family agreeable to plans will transport via ems RN to call report. Evette Cristal, MSW, [place]

## 2014-05-08 NOTE — Progress Notes (Signed)
[facility] MD Progress Note  05/08/2014 6:05 PM [name]  MRN:  [id number] Subjective:  Patient states she is doing well and is ready to go home. She has decided to be a good person and wants to "live the clean life." She relates that she no longer wants to die like she did when she first came to the hospital. She is better now and is ready to get the help she needs. She says her depression was triggered by her daughter who likes to hit on her her when she is using drugs. She informs me that her " Dr loves her and that he wants her to be well."  Objective: Patient appears to be minimizing her situation and her use of drugs as a problem. She has little insight into recovery but feels that she can do this on her own.  Diagnosis:   DSM5: Schizophrenia Disorders:   Obsessive-Compulsive Disorders:   Trauma-Stressor Disorders:   Substance/Addictive Disorders:  Cocaine intoxication Depressive Disorders:  Bipolar disorder MRE depressed, SIMDO with cocaine abuse Total Time spent with patient: 30 minutes    ADL's:  Impaired  Sleep: NA  Appetite:  Fair  Suicidal Ideation:  denies Homicidal Ideation:  denies AEB (as evidenced by):  Psychiatric Specialty Exam: Physical Exam  ROS  Blood pressure 115/76, pulse 97, temperature 97.7 F (36.5 C), temperature source Oral, resp. rate 18, height _0  (1.6 m), weight 60.646 kg (133 lb 11.2 oz), last menstrual period 05/21/2012, SpO2 100.00%.Body mass index is 23.69 kg/(m^2).  General Appearance: Disheveled and much older than stated age  Eye Contact::  Fair  Speech:  Slow  Volume:  Normal  Mood:  Euthymic  Affect:  Congruent  Thought Process:  Goal Directed  Orientation:  Full (Time, Place, and Person)  Thought Content:  WDL  Suicidal Thoughts:  No  Homicidal Thoughts:  No  Memory:  NA  Judgement:  Poor  Insight:  Shallow  Psychomotor Activity:  Psychomotor Retardation  Concentration:  Fair  Recall:  [place]   Language: Good  Akathisia:  No  Handed:  Right  AIMS (if indicated):     Assets:  Communication Skills Desire for Improvement Financial Resources/Insurance Housing  Sleep:      Musculoskeletal: Strength & Muscle Tone:  Gait & Station:  Patient leans:   Current Medications: Current Facility-Administered Medications  Medication Dose Route Frequency Provider Last Rate Last Dose  . budesonide-formoterol (SYMBICORT) 160-4.5 MCG/ACT inhaler 2 puff  2 puff Inhalation BID Rise Patience, MD   2 puff at 05/08/14 0916  . clonazePAM (KLONOPIN) tablet 2 mg  2 mg Oral BID Rise Patience, MD   2 mg at 05/08/14 1012  . colchicine tablet 0.6 mg  0.6 mg Oral Q breakfast Rise Patience, MD   0.6 mg at 05/08/14 0604  . divalproex (DEPAKOTE SPRINKLE) capsule 250 mg  250 mg Oral Q12H Hosie Poisson, MD   250 mg at 05/08/14 1013  . docusate sodium (COLACE) capsule 200 mg  200 mg Oral BID Hosie Poisson, MD   200 mg at 05/08/14 1255  . feeding supplement (ENSURE COMPLETE) (ENSURE COMPLETE) liquid 237 mL  237 mL Oral BID BM Rogue Bussing, RD   237 mL at 05/08/14 1424  . furosemide (LASIX) tablet 20 mg  20 mg Oral q morning - 10a Rise Patience, MD   20 mg at 05/08/14 1014  . gabapentin (NEURONTIN) capsule 300 mg  300 mg Oral TID  Rise Patience, MD   300 mg at 05/08/14 1014  . haloperidol lactate (HALDOL) injection 2 mg  2 mg Intravenous Q6H PRN Hosie Poisson, MD      . hydrochlorothiazide (HYDRODIURIL) tablet 25 mg  25 mg Oral QPM Rise Patience, MD   25 mg at 05/07/14 1704  . hydrocortisone (ANUSOL-HC) 2.5 % rectal cream   Rectal TID Brock Ra Lampkin, DO      . lactulose (CHRONULAC) 10 GM/15ML solution 20 g  20 g Oral BID Brock Ra Lampkin, DO   20 g at 05/08/14 1015  . megestrol (MEGACE) 400 MG/10ML suspension 400 mg  400 mg Oral Daily Brock Ra Lampkin, DO   400 mg at 05/08/14 1015  . methocarbamol (ROBAXIN) tablet 500 mg  500 mg Oral Q8H PRN Rise Patience, MD       . morphine 2 MG/ML injection 1-2 mg  1-2 mg Intravenous Q2H PRN Hosie Poisson, MD   2 mg at 05/08/14 0604  . multivitamin with minerals tablet 1 tablet  1 tablet Oral Daily Rise Patience, MD   1 tablet at 05/08/14 1018  . nicotine (NICODERM CQ - dosed in mg/24 hours) patch 21 mg  21 mg Transdermal Daily Hosie Poisson, MD   21 mg at 05/08/14 1016  . ondansetron (ZOFRAN) tablet 4 mg  4 mg Oral Q6H PRN Rise Patience, MD       Or  . ondansetron [facility]) injection 4 mg  4 mg Intravenous Q6H PRN Rise Patience, MD      . oxyCODONE (Oxy IR/ROXICODONE) immediate release tablet 5-10 mg  5-10 mg Oral Q4H PRN Hosie Poisson, MD   5 mg at 05/08/14 1255  . pantoprazole (PROTONIX) EC tablet 40 mg  40 mg Oral Daily Rise Patience, MD   40 mg at 05/08/14 1017  . polyethylene glycol (MIRALAX / GLYCOLAX) packet 17 g  17 g Oral Daily Hosie Poisson, MD   17 g at 05/08/14 1255  . sodium chloride 0.9 % injection 3 mL  3 mL Intravenous Q12H Rise Patience, MD   3 mL at 05/08/14 1256  . sodium chloride 0.9 % injection 3 mL  3 mL Intravenous Q12H Rise Patience, MD   3 mL at 05/08/14 1256    Lab Results:  Results for orders placed during the hospital encounter of 05/02/14 (from the past 48 hour(s))  BASIC METABOLIC PANEL     Status: Abnormal   Collection Time    05/07/14  5:15 PM      Result Value Ref Range   Sodium 139  137 - 147 mEq/L   Potassium 3.7  3.7 - 5.3 mEq/L   Chloride 101  96 - 112 mEq/L   CO2 26  19 - 32 mEq/L   Glucose, Bld 163 (*) 70 - 99 mg/dL   BUN 11  6 - 23 mg/dL   Creatinine, Ser 0.71  0.50 - 1.10 mg/dL   Calcium 8.7  8.4 - 10.5 mg/dL   GFR calc non Af Amer >90  >90 mL/min   GFR calc Af Amer >90  >90 mL/min   Comment: (NOTE)     The eGFR has been calculated using the CKD EPI equation.     This calculation has not been validated in all clinical situations.     eGFR's persistently <90 mL/min signify possible Chronic Kidney     Disease.   Anion gap 12  5 - 15  Physical Findings: AIMS:  , ,  ,  ,    CIWA:    COWS:     Treatment Plan Summary: Daily contact with patient to assess and evaluate symptoms and progress in treatment Medication management  Plan: 1. Continue current plan of care with no changes. 2. Recommend residential rehab upon discharge. Medical Decision Making Problem Points:  Established problem, stable/improving (1) Data Points:  Review of medication regiment & side effects (2)  Milta Deiters T. Mashburn RPAC 6:12 PM 05/08/2014  Case discussed, patient needs to go to a SNF, does not need inpatient psychiatric care Hampton Abbot, MD

## 2014-05-09 MED ORDER — SODIUM CHLORIDE 0.9 % IV BOLUS (SEPSIS)
1000.0000 mL | Freq: Once | INTRAVENOUS | Status: AC
  Administered 2014-05-09: 1000 mL via INTRAVENOUS

## 2014-05-09 NOTE — Progress Notes (Signed)
Pt d/c'd off unit via [facility]. Report called to [place]. Family aware, meeting pt at facility.

## 2014-05-09 NOTE — Clinical Social Work Placement (Signed)
Clinical Social Work Department CLINICAL SOCIAL WORK PLACEMENT NOTE 05/09/2014  Patient:  Dorothy Burns, Dorothy Burns  Account Number:  [id number] Admit date:  05/02/2014  Clinical Social Worker:  Labrittany Wechter, LCSWA  Date/time:  05/04/2014 03:44 PM  Clinical Social Work is seeking post-discharge placement for this patient at the following level of care:   [place]   (*CSW will update this form in Epic as items are completed)   05/04/2014  Patient/family provided with [place] Department of Clinical Social Work's list of facilities offering this level of care within the geographic area requested by the patient (or if unable, by the patient's family).  05/04/2014  Patient/family informed of their freedom to choose among providers that offer the needed level of care, that participate in Medicare, Medicaid or managed care program needed by the patient, have an available bed and are willing to accept the patient.  05/04/2014  Patient/family informed of [facility]' ownership interest in [facility], as well as of the fact that they are under no obligation to receive care at this facility.  PASARR submitted to EDS on 05/04/2014 PASARR number received on 05/04/2014  FL2 transmitted to all facilities in geographic area requested by pt/family on  05/04/2014 FL2 transmitted to all facilities within larger geographic area on 05/04/2014  Patient informed that his/her managed care company has contracts with or will negotiate with  certain facilities, including the following:     Patient/family informed of bed offers received:  05/09/2014 Patient chooses bed at [facility] Physician recommends and patient chooses bed at    Patient to be transferred to [facility] on  05/09/2014 Patient to be transferred to facility by [facility] Patient and family notified of transfer on 05/09/2014 Name of family member notified:  Jose Persia  The following physician  request were entered in Epic:   Additional Comments:   Jones Broom. [place], MSW, [place] 05/09/2014 5:38 PM

## 2014-05-09 NOTE — Care Management Note (Signed)
    Page 1 of 1   05/09/2014     5:23:05 PM CARE MANAGEMENT NOTE 05/09/2014  Patient:  Dorothy Burns, Dorothy Burns   Account Number:  [id number]  Date Initiated:  05/05/2014  Documentation initiated by:  [facility]  Subjective/Objective Assessment:   abd pain     Action/Plan:   SNF placement   Anticipated DC Date:  05/09/2014   Anticipated DC Plan:  SKILLED NURSING FACILITY  In-house referral  Clinical Social Worker      DC Planning Services  CM consult      Choice offered to / List presented to:             Status of service:   Medicare Important Message given?  YES (If response is "NO", the following Medicare IM given date fields will be blank) Date Medicare IM given:  05/05/2014 Medicare IM given by:  [facility] Date Additional Medicare IM given:  05/08/2014 Additional Medicare IM given by:  Ellan Lambert  Discharge Disposition:  [place]  Per UR Regulation:    If discussed at Long Length of Stay Meetings, dates discussed:   05/09/2014    Comments:  05/09/14 Ellan Lambert, Rn, BSN [phone] Pt discharged to SNF today, per CSW arrangements.

## 2014-05-18 ENCOUNTER — Encounter: Payer: Self-pay | Admitting: *Deleted

## 2014-05-18 ENCOUNTER — Ambulatory Visit: Payer: Self-pay | Admitting: Hematology and Oncology

## 2014-05-18 ENCOUNTER — Other Ambulatory Visit: Payer: Self-pay

## 2014-05-22 ENCOUNTER — Encounter (HOSPITAL_COMMUNITY): Payer: Self-pay | Admitting: Internal Medicine

## 2014-06-07 ENCOUNTER — Observation Stay (HOSPITAL_COMMUNITY): Payer: Medicare Other

## 2014-06-07 ENCOUNTER — Emergency Department (HOSPITAL_COMMUNITY): Payer: Medicare Other

## 2014-06-07 ENCOUNTER — Encounter (HOSPITAL_COMMUNITY): Payer: Self-pay | Admitting: *Deleted

## 2014-06-07 ENCOUNTER — Observation Stay (HOSPITAL_COMMUNITY)
Admission: EM | Admit: 2014-06-07 | Discharge: 2014-06-09 | Disposition: A | Discharge status: Skilled Nursing Facility | Payer: Medicare Other | Attending: Internal Medicine | Admitting: Internal Medicine

## 2014-06-07 DIAGNOSIS — M25552 Pain in left hip: Secondary | ICD-10-CM | POA: Diagnosis not present

## 2014-06-07 DIAGNOSIS — K219 Gastro-esophageal reflux disease without esophagitis: Secondary | ICD-10-CM | POA: Diagnosis not present

## 2014-06-07 DIAGNOSIS — I10 Essential (primary) hypertension: Secondary | ICD-10-CM | POA: Diagnosis not present

## 2014-06-07 DIAGNOSIS — F319 Bipolar disorder, unspecified: Secondary | ICD-10-CM | POA: Diagnosis present

## 2014-06-07 DIAGNOSIS — Z7982 Long term (current) use of aspirin: Secondary | ICD-10-CM | POA: Insufficient documentation

## 2014-06-07 DIAGNOSIS — Z8505 Personal history of malignant neoplasm of liver: Secondary | ICD-10-CM | POA: Insufficient documentation

## 2014-06-07 DIAGNOSIS — S32512A Fracture of superior rim of left pubis, initial encounter for closed fracture: Secondary | ICD-10-CM | POA: Diagnosis present

## 2014-06-07 DIAGNOSIS — E46 Unspecified protein-calorie malnutrition: Secondary | ICD-10-CM | POA: Insufficient documentation

## 2014-06-07 DIAGNOSIS — J45909 Unspecified asthma, uncomplicated: Secondary | ICD-10-CM | POA: Diagnosis not present

## 2014-06-07 DIAGNOSIS — Z886 Allergy status to analgesic agent status: Secondary | ICD-10-CM | POA: Insufficient documentation

## 2014-06-07 DIAGNOSIS — Y92122 Bedroom in nursing home as the place of occurrence of the external cause: Secondary | ICD-10-CM | POA: Diagnosis not present

## 2014-06-07 DIAGNOSIS — E860 Dehydration: Secondary | ICD-10-CM | POA: Diagnosis not present

## 2014-06-07 DIAGNOSIS — E43 Unspecified severe protein-calorie malnutrition: Secondary | ICD-10-CM | POA: Diagnosis present

## 2014-06-07 DIAGNOSIS — B1920 Unspecified viral hepatitis C without hepatic coma: Secondary | ICD-10-CM | POA: Insufficient documentation

## 2014-06-07 DIAGNOSIS — J449 Chronic obstructive pulmonary disease, unspecified: Secondary | ICD-10-CM | POA: Diagnosis not present

## 2014-06-07 DIAGNOSIS — E876 Hypokalemia: Secondary | ICD-10-CM | POA: Diagnosis not present

## 2014-06-07 DIAGNOSIS — F17210 Nicotine dependence, cigarettes, uncomplicated: Secondary | ICD-10-CM | POA: Diagnosis not present

## 2014-06-07 DIAGNOSIS — C220 Liver cell carcinoma: Secondary | ICD-10-CM | POA: Diagnosis present

## 2014-06-07 DIAGNOSIS — Y999 Unspecified external cause status: Secondary | ICD-10-CM | POA: Insufficient documentation

## 2014-06-07 DIAGNOSIS — W06XXXA Fall from bed, initial encounter: Secondary | ICD-10-CM | POA: Insufficient documentation

## 2014-06-07 DIAGNOSIS — D61818 Other pancytopenia: Secondary | ICD-10-CM | POA: Diagnosis not present

## 2014-06-07 DIAGNOSIS — M109 Gout, unspecified: Secondary | ICD-10-CM | POA: Diagnosis not present

## 2014-06-07 DIAGNOSIS — M25512 Pain in left shoulder: Secondary | ICD-10-CM | POA: Diagnosis not present

## 2014-06-07 DIAGNOSIS — R4182 Altered mental status, unspecified: Secondary | ICD-10-CM | POA: Diagnosis present

## 2014-06-07 DIAGNOSIS — Z79899 Other long term (current) drug therapy: Secondary | ICD-10-CM | POA: Insufficient documentation

## 2014-06-07 DIAGNOSIS — S32519A Fracture of superior rim of unspecified pubis, initial encounter for closed fracture: Secondary | ICD-10-CM | POA: Diagnosis present

## 2014-06-07 DIAGNOSIS — R4781 Slurred speech: Secondary | ICD-10-CM | POA: Insufficient documentation

## 2014-06-07 DIAGNOSIS — K7460 Unspecified cirrhosis of liver: Secondary | ICD-10-CM | POA: Diagnosis not present

## 2014-06-07 DIAGNOSIS — R627 Adult failure to thrive: Principal | ICD-10-CM | POA: Diagnosis present

## 2014-06-07 DIAGNOSIS — W19XXXA Unspecified fall, initial encounter: Secondary | ICD-10-CM | POA: Diagnosis present

## 2014-06-07 HISTORY — DX: Other pancytopenia: D61.818

## 2014-06-07 LAB — COMPREHENSIVE METABOLIC PANEL
ALK PHOS: 170 U/L — AB (ref 39–117)
ALT: 29 U/L (ref 0–35)
ANION GAP: 13 (ref 5–15)
AST: 81 U/L — ABNORMAL HIGH (ref 0–37)
Albumin: 2.4 g/dL — ABNORMAL LOW (ref 3.5–5.2)
BUN: 18 mg/dL (ref 6–23)
CO2: 23 mEq/L (ref 19–32)
Calcium: 9.5 mg/dL (ref 8.4–10.5)
Chloride: 103 mEq/L (ref 96–112)
Creatinine, Ser: 0.75 mg/dL (ref 0.50–1.10)
GFR calc Af Amer: >90 mL/min (ref >90)
GFR calc non Af Amer: >90 mL/min (ref >90)
Glucose, Bld: 98 mg/dL (ref 70–99)
POTASSIUM: 3.3 meq/L — AB (ref 3.7–5.3)
SODIUM: 139 meq/L (ref 137–147)
TOTAL PROTEIN: 7.4 g/dL (ref 6.0–8.3)
Total Bilirubin: 1.1 mg/dL (ref 0.3–1.2)

## 2014-06-07 LAB — URINE MICROSCOPIC-ADD ON

## 2014-06-07 LAB — CBC WITH DIFFERENTIAL/PLATELET
BASOS PCT: 0 % (ref 0–1)
Basophils Absolute: 0 10*3/uL (ref 0.0–0.1)
Eosinophils Absolute: 0 10*3/uL (ref 0.0–0.7)
Eosinophils Relative: 2 % (ref 0–5)
HCT: 29.8 % — ABNORMAL LOW (ref 36.0–46.0)
HEMOGLOBIN: 9.8 g/dL — AB (ref 12.0–15.0)
Lymphocytes Relative: 26 % (ref 12–46)
Lymphs Abs: 0.7 10*3/uL (ref 0.7–4.0)
MCH: 31.7 pg (ref 26.0–34.0)
MCHC: 32.9 g/dL (ref 30.0–36.0)
MCV: 96.4 fL (ref 78.0–100.0)
MONO ABS: 0.5 10*3/uL (ref 0.1–1.0)
MONOS PCT: 18 % — AB (ref 3–12)
NEUTROS ABS: 1.4 10*3/uL — AB (ref 1.7–7.7)
Neutrophils Relative %: 55 % (ref 43–77)
Platelets: 95 10*3/uL — ABNORMAL LOW (ref 150–400)
RBC: 3.09 MIL/uL — ABNORMAL LOW (ref 3.87–5.11)
RDW: 15 % (ref 11.5–15.5)
WBC: 2.6 10*3/uL — ABNORMAL LOW (ref 4.0–10.5)

## 2014-06-07 LAB — URINALYSIS, ROUTINE W REFLEX MICROSCOPIC
Bilirubin Urine: NEGATIVE
Glucose, UA: NEGATIVE mg/dL
Ketones, ur: NEGATIVE mg/dL
Leukocytes, UA: NEGATIVE
NITRITE: NEGATIVE
Protein, ur: NEGATIVE mg/dL
SPECIFIC GRAVITY, URINE: 1.008 (ref 1.005–1.030)
UROBILINOGEN UA: 1 mg/dL (ref 0.0–1.0)
pH: 7 (ref 5.0–8.0)

## 2014-06-07 LAB — RAPID URINE DRUG SCREEN, HOSP PERFORMED
AMPHETAMINES: NOT DETECTED
Barbiturates: NOT DETECTED
Benzodiazepines: NOT DETECTED
Cocaine: NOT DETECTED
OPIATES: NOT DETECTED
Tetrahydrocannabinol: NOT DETECTED

## 2014-06-07 LAB — VALPROIC ACID LEVEL: Valproic Acid Lvl: 64.9 ug/mL (ref 50.0–100.0)

## 2014-06-07 LAB — AMMONIA: Ammonia: 10 umol/L — ABNORMAL LOW (ref 11–60)

## 2014-06-07 LAB — MAGNESIUM: Magnesium: 2.1 mg/dL (ref 1.5–2.5)

## 2014-06-07 LAB — ETHANOL: Alcohol, Ethyl (B): <11 mg/dL (ref 0–11)

## 2014-06-07 LAB — GLUCOSE, CAPILLARY: Glucose-Capillary: 153 mg/dL — ABNORMAL HIGH (ref 70–99)

## 2014-06-07 MED ORDER — CETYLPYRIDINIUM CHLORIDE 0.05 % MT LIQD
7.0000 mL | Freq: Two times a day (BID) | OROMUCOSAL | Status: DC
  Administered 2014-06-08 – 2014-06-09 (×3): 7 mL via OROMUCOSAL

## 2014-06-07 MED ORDER — LACTULOSE 10 GM/15ML PO SOLN
20.0000 g | Freq: Two times a day (BID) | ORAL | Status: DC
  Administered 2014-06-07 – 2014-06-09 (×4): 20 g via ORAL
  Filled 2014-06-07 (×5): qty 30

## 2014-06-07 MED ORDER — IBUPROFEN 200 MG PO TABS
400.0000 mg | ORAL_TABLET | Freq: Four times a day (QID) | ORAL | Status: DC | PRN
  Administered 2014-06-08: 400 mg via ORAL
  Filled 2014-06-07: qty 2

## 2014-06-07 MED ORDER — SODIUM CHLORIDE 0.9 % IV BOLUS (SEPSIS)
500.0000 mL | Freq: Once | INTRAVENOUS | Status: DC
Start: 2014-06-07 — End: 2014-06-09

## 2014-06-07 MED ORDER — MORPHINE SULFATE 2 MG/ML IJ SOLN
1.0000 mg | INTRAMUSCULAR | Status: DC | PRN
Start: 2014-06-07 — End: 2014-06-09
  Administered 2014-06-08 (×3): 1 mg via INTRAVENOUS
  Filled 2014-06-07 (×3): qty 1

## 2014-06-07 MED ORDER — IPRATROPIUM BROMIDE 0.02 % IN SOLN
0.5000 mg | RESPIRATORY_TRACT | Status: DC | PRN
Start: 2014-06-07 — End: 2014-06-09

## 2014-06-07 MED ORDER — MEGESTROL ACETATE 400 MG/10ML PO SUSP
400.0000 mg | Freq: Every day | ORAL | Status: DC
  Administered 2014-06-08 – 2014-06-09 (×2): 400 mg via ORAL
  Filled 2014-06-07 (×3): qty 10

## 2014-06-07 MED ORDER — OXYCODONE HCL 5 MG PO TABS
5.0000 mg | ORAL_TABLET | ORAL | Status: DC | PRN
  Administered 2014-06-09 (×2): 5 mg via ORAL
  Filled 2014-06-07 (×3): qty 1

## 2014-06-07 MED ORDER — DOCUSATE SODIUM 100 MG PO CAPS
100.0000 mg | ORAL_CAPSULE | Freq: Two times a day (BID) | ORAL | Status: DC
  Administered 2014-06-07 – 2014-06-09 (×4): 100 mg via ORAL
  Filled 2014-06-07 (×5): qty 1

## 2014-06-07 MED ORDER — QUETIAPINE FUMARATE 100 MG PO TABS
100.0000 mg | ORAL_TABLET | Freq: Two times a day (BID) | ORAL | Status: DC
  Administered 2014-06-07 – 2014-06-09 (×4): 100 mg via ORAL
  Filled 2014-06-07 (×5): qty 1

## 2014-06-07 MED ORDER — SODIUM CHLORIDE 0.9 % IV SOLN
INTRAVENOUS | Status: AC
  Administered 2014-06-07: 17:00:00 via INTRAVENOUS

## 2014-06-07 MED ORDER — ONDANSETRON HCL 4 MG/2ML IJ SOLN
4.0000 mg | Freq: Four times a day (QID) | INTRAMUSCULAR | Status: DC | PRN
Start: 2014-06-07 — End: 2014-06-09

## 2014-06-07 MED ORDER — ASPIRIN 81 MG PO CHEW
81.0000 mg | CHEWABLE_TABLET | Freq: Every day | ORAL | Status: DC
  Administered 2014-06-08 – 2014-06-09 (×2): 81 mg via ORAL
  Filled 2014-06-07 (×3): qty 1

## 2014-06-07 MED ORDER — OXYCODONE HCL 5 MG PO CAPS
5.0000 mg | ORAL_CAPSULE | ORAL | Status: DC | PRN
Start: 2014-06-07 — End: 2014-06-07

## 2014-06-07 MED ORDER — SORBITOL 70 % SOLN
30.0000 mL | Freq: Every day | Status: DC | PRN
  Filled 2014-06-07: qty 30

## 2014-06-07 MED ORDER — VITAMIN B-1 100 MG PO TABS
100.0000 mg | ORAL_TABLET | Freq: Every day | ORAL | Status: DC
  Administered 2014-06-08 – 2014-06-09 (×2): 100 mg via ORAL
  Filled 2014-06-07 (×2): qty 1

## 2014-06-07 MED ORDER — POLYETHYLENE GLYCOL 3350 17 G PO PACK
17.0000 g | PACK | Freq: Every day | ORAL | Status: DC
  Administered 2014-06-07 – 2014-06-09 (×2): 17 g via ORAL
  Filled 2014-06-07 (×3): qty 1

## 2014-06-07 MED ORDER — SODIUM CHLORIDE 0.9 % IV SOLN
INTRAVENOUS | Status: DC
  Administered 2014-06-07: 14:00:00 via INTRAVENOUS

## 2014-06-07 MED ORDER — POLYETHYLENE GLYCOL 3350 17 G PO PACK
17.0000 g | PACK | Freq: Every day | ORAL | Status: DC | PRN
  Administered 2014-06-07 – 2014-06-08 (×2): 17 g via ORAL
  Filled 2014-06-07 (×3): qty 1

## 2014-06-07 MED ORDER — DIVALPROEX SODIUM 250 MG PO DR TAB
250.0000 mg | DELAYED_RELEASE_TABLET | Freq: Two times a day (BID) | ORAL | Status: DC
  Administered 2014-06-07 – 2014-06-09 (×4): 250 mg via ORAL
  Filled 2014-06-07 (×5): qty 1

## 2014-06-07 MED ORDER — PANTOPRAZOLE SODIUM 40 MG PO TBEC
40.0000 mg | DELAYED_RELEASE_TABLET | Freq: Every day | ORAL | Status: DC
  Administered 2014-06-08 – 2014-06-09 (×2): 40 mg via ORAL
  Filled 2014-06-07 (×2): qty 1

## 2014-06-07 MED ORDER — GABAPENTIN 300 MG PO CAPS
300.0000 mg | ORAL_CAPSULE | Freq: Three times a day (TID) | ORAL | Status: DC
  Administered 2014-06-07 – 2014-06-09 (×6): 300 mg via ORAL
  Filled 2014-06-07 (×8): qty 1

## 2014-06-07 MED ORDER — CHLORHEXIDINE GLUCONATE 0.12 % MT SOLN
15.0000 mL | Freq: Two times a day (BID) | OROMUCOSAL | Status: DC
  Administered 2014-06-07 – 2014-06-09 (×4): 15 mL via OROMUCOSAL
  Filled 2014-06-07 (×6): qty 15

## 2014-06-07 MED ORDER — METHOCARBAMOL 500 MG PO TABS
500.0000 mg | ORAL_TABLET | Freq: Three times a day (TID) | ORAL | Status: DC | PRN
  Administered 2014-06-08: 500 mg via ORAL
  Filled 2014-06-07: qty 1

## 2014-06-07 MED ORDER — NICOTINE 21 MG/24HR TD PT24
21.0000 mg | MEDICATED_PATCH | Freq: Every day | TRANSDERMAL | Status: DC
  Administered 2014-06-08 – 2014-06-09 (×2): 21 mg via TRANSDERMAL
  Filled 2014-06-07 (×2): qty 1

## 2014-06-07 MED ORDER — B-1 100 MG PO TABS: ORAL_TABLET | Freq: Every day | ORAL | Status: DC

## 2014-06-07 MED ORDER — ALUM & MAG HYDROXIDE-SIMETH 200-200-20 MG/5ML PO SUSP: 30.0000 mL | Freq: Four times a day (QID) | ORAL | Status: DC | PRN

## 2014-06-07 MED ORDER — POTASSIUM CHLORIDE CRYS ER 20 MEQ PO TBCR
40.0000 meq | EXTENDED_RELEASE_TABLET | Freq: Once | ORAL | Status: AC
  Administered 2014-06-07: 40 meq via ORAL
  Filled 2014-06-07: qty 2

## 2014-06-07 MED ORDER — FLEET ENEMA 7-19 GM/118ML RE ENEM: 1.0000 | ENEMA | Freq: Once | RECTAL | Status: AC | PRN

## 2014-06-07 MED ORDER — COLCHICINE 0.6 MG PO TABS
0.6000 mg | ORAL_TABLET | Freq: Every day | ORAL | Status: DC
Start: 2014-06-08 — End: 2014-06-09
  Administered 2014-06-08 – 2014-06-09 (×2): 0.6 mg via ORAL
  Filled 2014-06-07 (×4): qty 1

## 2014-06-07 MED ORDER — HYDROCORTISONE 2.5 % RE CREA
TOPICAL_CREAM | Freq: Three times a day (TID) | RECTAL | Status: DC
  Administered 2014-06-07: 21:00:00 via RECTAL
  Administered 2014-06-08: 1 via RECTAL
  Administered 2014-06-08 – 2014-06-09 (×2): via RECTAL
  Filled 2014-06-07: qty 28.35

## 2014-06-07 MED ORDER — ASPIRIN 81 MG PO TABS: 81.0000 mg | ORAL_TABLET | Freq: Every day | ORAL | Status: DC

## 2014-06-07 MED ORDER — ONDANSETRON HCL 4 MG PO TABS: 4.0000 mg | ORAL_TABLET | Freq: Four times a day (QID) | ORAL | Status: DC | PRN

## 2014-06-07 MED ORDER — ALBUTEROL SULFATE (2.5 MG/3ML) 0.083% IN NEBU
2.5000 mg | INHALATION_SOLUTION | RESPIRATORY_TRACT | Status: DC | PRN
  Administered 2014-06-08: 2.5 mg via RESPIRATORY_TRACT
  Filled 2014-06-07: qty 3

## 2014-06-07 MED ORDER — NALOXONE HCL 0.4 MG/ML IJ SOLN: 0.4000 mg | Freq: Once | INTRAMUSCULAR | Status: DC

## 2014-06-07 MED ORDER — BUDESONIDE-FORMOTEROL FUMARATE 160-4.5 MCG/ACT IN AERO
2.0000 | INHALATION_SPRAY | Freq: Two times a day (BID) | RESPIRATORY_TRACT | Status: DC
  Administered 2014-06-07 – 2014-06-09 (×4): 2 via RESPIRATORY_TRACT
  Filled 2014-06-07: qty 6

## 2014-06-07 MED ORDER — CLONAZEPAM 1 MG PO TABS
2.0000 mg | ORAL_TABLET | Freq: Two times a day (BID) | ORAL | Status: DC
Start: 2014-06-07 — End: 2014-06-09
  Administered 2014-06-07 – 2014-06-09 (×4): 2 mg via ORAL
  Filled 2014-06-07 (×5): qty 2

## 2014-06-07 NOTE — ED Notes (Signed)
Pt appears drowsy. She sllurs her words, sts she fell this morning and she hurts all over, at times she speaks incoherently, she is having a hard time keeping her eyes open

## 2014-06-07 NOTE — H&P (Signed)
[facility] History and Physical  BERNARDA ERCK GLO:[id number] DOB: 1956-05-07 DOA: 06/07/2014  Referring physician: Dr. Thurnell Garbe PCP: Barbette Merino, MD   Chief Complaint: Weakness/Fall  HPI: Dorothy Burns is a 58 y.o. female  With history of hepatocellular carcinoma status post embolization, hep C, cirrhosis, history of cocaine abuse, bipolar disorder who was recently hospitalized from 05/02/2014 - 05/08/2014, for abdominal pain and pain control who presents from nursing facility after a fall with generalized weakness. Patient is somewhat drowsy however answering questions appropriately. Patient stated she fell off the bed and landed on her left side started to have some pain around 6:30 on the morning of admission. Patient stated that she was trying to get to the bathroom and she felt too weak to even crawl or walk to the bathroom. Patient stated that she subsequently pedal on the floor and was cursed at by the staff at the facility. Patient was subsequently uploaded into a truck and brought to the emergency room. Patient denies any fevers, no chills, no nausea, no vomiting, no chest pain, no dysuria, no melena, no hematemesis, no hematochezia, no diarrhea, no constipation, no cough. Patient does endorse a chronic shortness of breath as well as some generalized weakness. Patient was seen in the emergency roomUDS which was done was negative. Alcohol level was less than 11. Ammonia level was 18.[id number] metabolic profile had a potassium of 3.3 out phosphatase of 170, albumin of 2.4 AST of 81 otherwise was within normal limits. CBC had a white count of 2.6 hemoglobin of 9.8 and a platelet count of 95.Depakote level was 64.9. Glucose level was 98.CT of the head which was done was negative.x-ray of the left hip showed a minimally displaced left superior pubic ramus fracture. No acute fracture or dislocation identified in the proximal left femur. X-ray of the left shoulder was negative.  Urinalysis which was done was nitrite negative, leukocytes negative, -0-2 WBCs. ED physician stated that they try to ambulate the patient however patient was too weak to be ambulated and as such [facility] were called to admit the patient for further evaluation and management.    Review of Systems: as per history of present otherwise negative. Constitutional:  No weight loss, night sweats, Fevers, chills, fatigue.  HEENT:  No headaches, Difficulty swallowing,Tooth/dental problems,Sore throat,  No sneezing, itching, ear ache, nasal congestion, post nasal drip,  Cardio-vascular:  No chest pain, Orthopnea, PND, swelling in lower extremities, anasarca, dizziness, palpitations  GI:  No heartburn, indigestion, abdominal pain, nausea, vomiting, diarrhea, change in bowel habits, loss of appetite  Resp:  No shortness of breath with exertion or at rest. No excess mucus, no productive cough, No non-productive cough, No coughing up of blood.No change in color of mucus.No wheezing.No chest wall deformity  Skin:  no rash or lesions.  GU:  no dysuria, change in color of urine, no urgency or frequency. No flank pain.  Musculoskeletal:  No joint pain or swelling. No decreased range of motion. No back pain.  Psych:  No change in mood or affect. No depression or anxiety. No memory loss.   Past Medical History  Diagnosis Date  . Hypertension   . Bipolar 1 disorder   . Menorrhagia   . Post-menopausal bleeding     MOST RECENT BLEEDING  WAS NOV 2013  . Paresthesia   . COPD (chronic obstructive pulmonary disease)   . 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 Gout   . Mental disorder  bipolar  . Asthma   . Shortness of breath   . Anemia   . GERD (gastroesophageal reflux disease)   . Gallstones     ABD PAIN, N&V  . Arthritis   . Headache(784.0)   . Hepatitis     Hep C x 10 yrs  - NO TREATMENT  . PONV (postoperative nausea and vomiting)   . Cirrhosis   . Cancer   . Liver  cancer   . Pancytopenia 06/07/2014   Past Surgical History  Procedure Laterality Date  . Tubal ligation    . Facial reconstruction surgery    . Multiple tooth extractions    . Hysteroscopy w/d&c  09/29/2011    Procedure: DILATATION AND CURETTAGE /HYSTEROSCOPY;  Surgeon: Mora Bellman, MD;  Location: [facility];  Service: Gynecology;  Laterality: N/A;  . Cholecystectomy  08/20/2012    Procedure: LAPAROSCOPIC CHOLECYSTECTOMY;  Surgeon: Ralene Ok, MD;  Location: [facility];  Service: General;;  . Embolization of dominant hcc within left lobe of liver  03/23/14   Social History:  reports that she quit smoking about 2 months ago. Her smoking use included Cigarettes. She has a 7.5 pack-year smoking history. She has never used smokeless tobacco. She reports that she does not drink alcohol or use illicit drugs.  Allergies  Allergen Reactions  . Nsaids Other (See Comments)    Liver issues   . Tylenol [Acetaminophen] Other (See Comments)    Liver issues    Family History  Problem Relation Age of Onset  . COPD Mother   . Diabetes Mother   . Heart disease Mother   . Diabetes Father   . Heart disease Father   . Hypertension Father   . Cancer Father     basal cell ca and prostate ca     Prior to Admission medications   Medication Sig Start Date End Date Taking? Authorizing Provider  aspirin 81 MG tablet Take 81 mg by mouth daily.    Yes Historical Provider, MD  budesonide-formoterol (SYMBICORT) 160-4.5 MCG/ACT inhaler Inhale 2 puffs into the lungs 2 (two) times daily.   Yes Historical Provider, MD  clonazePAM (KLONOPIN) 2 MG tablet Take 2 mg by mouth 2 (two) times daily. 04/18/14  Yes Mahima Bubba Camp, MD  colchicine 0.6 MG tablet Take 0.6 mg by mouth daily with breakfast.    Yes Historical Provider, MD  divalproex (DEPAKOTE) 250 MG DR tablet Take 250 mg by mouth 2 (two) times daily.   Yes Historical Provider, MD  furosemide (LASIX) 20 MG tablet Take 20 mg by mouth every morning.    Yes  Historical Provider, MD  gabapentin (NEURONTIN) 300 MG capsule Take 300 mg by mouth 3 (three) times daily. 10/17/13  Yes Historical Provider, MD  hydrochlorothiazide (HYDRODIURIL) 25 MG tablet Take 25 mg by mouth every evening.   Yes Historical Provider, MD  hydrocortisone (ANUSOL-HC) 2.5 % rectal cream Place rectally 3 (three) times daily. Patient taking differently: Place 1 application rectally 3 (three) times daily.  05/08/14  Yes Hosie Poisson, MD  lactulose (CHRONULAC) 10 GM/15ML solution Take 30 mLs (20 g total) by mouth 2 (two) times daily. 05/08/14  Yes Hosie Poisson, MD  megestrol (MEGACE) 400 MG/10ML suspension Take 10 mLs (400 mg total) by mouth daily. 05/08/14  Yes Hosie Poisson, MD  methocarbamol (ROBAXIN) 500 MG tablet Take 1 tablet (500 mg total) by mouth every 8 (eight) hours as needed for muscle spasms (back pain). 05/02/14  Yes Kalman Drape, MD  nicotine (NICODERM  CQ - DOSED IN MG/24 HOURS) 21 mg/24hr patch Place 1 patch (21 mg total) onto the skin daily. 05/08/14  Yes Hosie Poisson, MD  omeprazole (PRILOSEC) 20 MG capsule Take 20 mg by mouth daily with breakfast.    Yes Historical Provider, MD  oxycodone (OXY-IR) 5 MG capsule Take 1 capsule (5 mg total) by mouth every 4 (four) hours as needed for pain. Patient taking differently: Take 5 mg by mouth every 6 (six) hours as needed for pain.  05/08/14  Yes Hosie Poisson, MD  polyethylene glycol (MIRALAX / GLYCOLAX) packet Take 17 g by mouth daily. 05/08/14  Yes Hosie Poisson, MD  QUEtiapine (SEROQUEL) 100 MG tablet Take 100 mg by mouth 2 (two) times daily.   Yes Historical Provider, MD  Thiamine HCl (B-1 PO) Take 1 capsule by mouth daily.   Yes Historical Provider, MD  zolpidem (AMBIEN) 5 MG tablet Take 5 mg by mouth at bedtime.   Yes Historical Provider, MD   Physical Exam: Filed Vitals:   06/07/14 1055 06/07/14 1314 06/07/14 1400 06/07/14 1500  BP: 114/83 108/72 115/79 114/74  Pulse: 108 91 89   Temp: 98.4 F (36.9 C) 98 F (36.7 C)     TempSrc: Oral Oral    Resp: 14 18    SpO2: 99% 99% 100%     Wt Readings from Last 3 Encounters:  05/06/14 60.646 kg (133 lb 11.2 oz)  04/18/14 63.504 kg (140 lb)  04/11/14 58.196 kg (128 lb 4.8 oz)    General:  Patient is drowsy but arouses easily to verbal stimuli. Cachectic looking. Speaking in full sentences in no acute cardiopulmonary distress. Eyes: PERRLA, EOMI, normal lids, irises & conjunctiva ENT: grossly normal hearing, lips & tongue. EXTREMELY DRY MUCOUS MEMBRANES. Neck: no LAD, masses or thyromegaly Cardiovascular: RRR, no m/r/g. No LE edema. Respiratory: CTA bilaterally, no w/r/r. Normal respiratory effort. Abdomen: soft, ntnd, positive bowel sounds, no rebound, no guarding. Skin: no rash or induration seen on limited exam Musculoskeletal: grossly normal tone BUE/BLE Psychiatric: grossly normal mood and affect, speech fluent and appropriate Neurologic: drowsy however easily arousable. Following commands. Cranial nerves II through XII are grossly intact. Sensation is intact. Visual fields are intact. Gait not tested secondary to safety.          Labs on Admission:  Basic Metabolic Panel:  Recent Labs Lab 06/07/14 1205  NA 139  K 3.3*  CL 103  CO2 23  GLUCOSE 98  BUN 18  CREATININE 0.75  CALCIUM 9.5   Liver Function Tests:  Recent Labs Lab 06/07/14 1205  AST 81*  ALT 29  ALKPHOS 170*  BILITOT 1.1  PROT 7.4  ALBUMIN 2.4*   No results for input(s): LIPASE, AMYLASE in the last 168 hours.  Recent Labs Lab 06/07/14 1206  AMMONIA 10*   CBC:  Recent Labs Lab 06/07/14 1205  WBC 2.6*  NEUTROABS 1.4*  HGB 9.8*  HCT 29.8*  MCV 96.4  PLT 95*   Cardiac Enzymes: No results for input(s): CKTOTAL, CKMB, CKMBINDEX, TROPONINI in the last 168 hours.  BNP (last 3 results) No results for input(s): PROBNP in the last 8760 hours. CBG: No results for input(s): GLUCAP in the last 168 hours.  Radiological Exams on Admission: Dg Hip Complete  Left  06/07/2014   CLINICAL DATA:  47 -year-old female status post fall at assisted living with acute pain and abnormal speech. Initial encounter.  EXAM: LEFT HIP - COMPLETE 2+ VIEW  COMPARISON:  Pelvis CT 12/30/2013  FINDINGS: Femoral heads normally located. SI joints within normal limits. Grossly intact proximal right femur. Proximal left femur intact. There is mild cortical irregularity at the medial aspect of the left superior pubic symphysis (arrows). This is new since June. Elsewhere the pelvis appears intact.  IMPRESSION: 1. Minimally displaced left superior pubic ramus fracture. 2. No acute fracture or dislocation identified in the proximal left femur.   Electronically Signed   By: Lars Pinks M.D.   On: 06/07/2014 11:57   Ct Head Wo Contrast  06/07/2014   CLINICAL DATA:  Very drowsy, slurred words  EXAM: CT HEAD WITHOUT CONTRAST  TECHNIQUE: Contiguous axial images were obtained from the base of the skull through the vertex without intravenous contrast.  COMPARISON:  04/25/2014  FINDINGS: There is no evidence of mass effect, midline shift, or extra-axial fluid collections. There is no evidence of a space-occupying lesion or intracranial hemorrhage. There is no evidence of a cortical-based area of acute infarction. There is generalized cerebral atrophy. There is periventricular white matter low attenuation likely secondary to microangiopathy.  The ventricles and sulci are appropriate for the patient's age. The basal cisterns are patent.  Visualized portions of the orbits are unremarkable. The visualized portions of the paranasal sinuses and mastoid air cells are unremarkable.  The osseous structures are unremarkable. There is evidence of prior right maxillary repair and lateral orbital repair. There is an old right orbital wall fracture.  IMPRESSION: 1. No acute intracranial pathology. 2. Chronic microvascular disease and mild atrophy.   Electronically Signed   By: Kathreen Devoid   On: 06/07/2014  13:09   Dg Shoulder Left  06/07/2014   CLINICAL DATA:  Left hip and shoulder pain.  EXAM: LEFT SHOULDER - 2+ VIEW  COMPARISON:  None.  FINDINGS: There is no evidence of fracture or dislocation. There is no evidence of arthropathy or other focal bone abnormality. Soft tissues are unremarkable.  IMPRESSION: Negative.   Electronically Signed   By: Kathreen Devoid   On: 06/07/2014 11:55    EKG: none  Assessment/Plan Principal Problem:   FTT (failure to thrive) in adult Active Problems:   DSORD BIPOLAR I, UNSPC, MOST RECENT EPSD   HYPERTENSION, BENIGN ESSENTIAL   Cancer, hepatocellular   Liver cirrhosis   Protein-calorie malnutrition, severe   Dehydration   Altered mental state   Closed fracture of superior pubic ramus   Fall   Pancytopenia   Hypokalemia   #1 failure to thrive Likely secondary to history of hepatocellular carcinoma, hepatitis C, cirrhosis.patient also looks clinically dehydrated. We'll get a chest x-ray to rule out pneumonia. Patient was seen by the palliative care team during her last hospitalization in 05/07/2014 at which time patient was made a DO NOT RESUSCITATE and goals of care included symptom management and recommendations of skilled nursing facility with hospice care versus outpatient palliative care services. During last hospitalization discharge her physician and stated that patient had refused to see on oncologist at that time. Will place on IV fluids. Supportive care. Will consult with palliative care again for goals of care. Patient may need to be transitioned to hospice home.  #2 hypokalemia Check a magnesium level. Replete.  #3) minimally displaced left superior pubic ramus fracture Secondary to mechanical fall. Pain management. PT/OT.  #4 dehydration IV fluids.  #5 pancytopenia Chronic in nature. Likely secondary to cirrhosis.  #6 hepatocellular carcinoma/liver cirrhosis/hep C Per prior discharge summary patient has been refusing to be followed by  oncologist and had been told of  her prognosis without any treatment. Continue lactulose. Pain management. Will consult with palliative care for goals of care. Patient may need hospice home.  #7 bipolar disorder Continue home regimen of Depakote,Seroquel,Klonopin. Follow.  #8 protein calorie malnutrition Continue Megace.  #9 prophylaxis PPI for GI prophylaxis. SCDs for DVT prophylaxis.  Code Status: DO NOT RESUSCITATE DVT Prophylaxis:SCDs Family Communication: updated patient at bedside no family present. Disposition Plan: admit to MedSurg.  Time spent: [facility] Pager [phone]

## 2014-06-07 NOTE — ED Notes (Addendum)
Patient is hard to wake. Patient responding little to sternal rub and once awake does not coherently speak complete sentences or open eyes all the way.

## 2014-06-07 NOTE — Progress Notes (Signed)
Received report from ED RN, Pt arrived unit on stretcher. Pt is alert, extremely lethargic, Md notified of Pt's location. Will continue with current plan of care.

## 2014-06-07 NOTE — ED Notes (Signed)
Per EMS pt coming from [facility] assisted living with c/o fall this morning and left hip and shoulder pain as well as vaginal pain and burning.

## 2014-06-07 NOTE — ED Provider Notes (Signed)
CSN: [id number]     Arrival date & time 06/07/14  1049 History   First MD Initiated Contact with Patient 06/07/14 1050     Chief Complaint  Patient presents with  . Fall  . Shoulder Pain  . Hip Pain  . Dysuria     HPI Pt was seen at 1100.  Per EMS, NH report and pt: c/o sudden onset and resolution of one episode of fall out of bed that occurred this morning PTA. Pt states she "had to go to the bathroom," so she "crawled there after I fell out of bed." Pt c/o left shoulder and hip pain, as well as dysuria x2 days. Denies hitting head, no LOC, no neck or back pain, no CP/SOB, no abd pain, no focal motor weakness, no tingling/numbness in extremities.    Past Medical History  Diagnosis Date  . Hypertension   . Bipolar 1 disorder   . Menorrhagia   . Post-menopausal bleeding     MOST RECENT BLEEDING  WAS NOV 2013  . Paresthesia   . COPD (chronic obstructive pulmonary disease)   . 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 Gout   . Mental disorder     bipolar  . Asthma   . Shortness of breath   . Anemia   . GERD (gastroesophageal reflux disease)   . Gallstones     ABD PAIN, N&V  . Arthritis   . Headache(784.0)   . Hepatitis     Hep C x 10 yrs  - NO TREATMENT  . PONV (postoperative nausea and vomiting)   . Cirrhosis   . Cancer   . Liver cancer    Past Surgical History  Procedure Laterality Date  . Tubal ligation    . Facial reconstruction surgery    . Multiple tooth extractions    . Hysteroscopy w/d&c  09/29/2011    Procedure: DILATATION AND CURETTAGE /HYSTEROSCOPY;  Surgeon: Mora Bellman, MD;  Location: [facility];  Service: Gynecology;  Laterality: N/A;  . Cholecystectomy  08/20/2012    Procedure: LAPAROSCOPIC CHOLECYSTECTOMY;  Surgeon: Ralene Ok, MD;  Location: [facility];  Service: General;;  . 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 COPD Mother   . Diabetes Mother   . Heart disease  Mother   . Diabetes Father   . Heart disease Father   . Hypertension Father   . Cancer Father     basal cell ca and prostate ca   History  Substance Use Topics  . Smoking status: Current Every Day Smoker -- 0.25 packs/day for 30 years    Types: Cigarettes  . Smokeless tobacco: Never Used     Comment: in process of quitting---would like nicotine patch if insurance covers it  . Alcohol Use: No     Comment: recovering alcoholic    NO COCAINE SINCE 1990   OB History    Gravida Para Term Preterm AB TAB SAB Ectopic Multiple Living   8 4 4  0 4 1 3   4      Review of Systems ROS: Statement: All systems negative except as marked or noted in the HPI; Constitutional: Negative for fever and chills. ; ; Eyes: Negative for eye pain, redness and discharge. ; ; ENMT: Negative for ear pain, hoarseness, nasal congestion, sinus pressure and sore throat. ; ; Cardiovascular: Negative for chest pain, palpitations, diaphoresis, dyspnea and peripheral  edema. ; ; Respiratory: Negative for cough, wheezing and stridor. ; ; Gastrointestinal: Negative for nausea, vomiting, diarrhea, abdominal pain, blood in stool, hematemesis, jaundice and rectal bleeding. . ; ; Genitourinary: +dysuria. Negative for flank pain and hematuria. ; ; Musculoskeletal: +left shoulder pain, left hip pain. Negative for back pain and neck pain. Negative for swelling and deformity.; ; Skin: Negative for pruritus, rash, abrasions, blisters, bruising and skin lesion.; ; Neuro: Negative for headache, lightheadedness and neck stiffness. Negative for weakness, altered level of consciousness , altered mental status, extremity weakness, paresthesias, involuntary movement, seizure and syncope.      Allergies  Nsaids and Tylenol  Home Medications   Prior to Admission medications   Medication Sig Start Date End Date Taking? Authorizing Provider  aspirin 81 MG tablet Take 81 mg by mouth daily.    Yes Historical Provider, MD  budesonide-formoterol  (SYMBICORT) 160-4.5 MCG/ACT inhaler Inhale 2 puffs into the lungs 2 (two) times daily.   Yes Historical Provider, MD  clonazePAM (KLONOPIN) 2 MG tablet Take 2 mg by mouth 2 (two) times daily. 04/18/14  Yes Mahima Bubba Camp, MD  colchicine 0.6 MG tablet Take 0.6 mg by mouth daily with breakfast.    Yes Historical Provider, MD  divalproex (DEPAKOTE) 250 MG DR tablet Take 250 mg by mouth 2 (two) times daily.   Yes Historical Provider, MD  furosemide (LASIX) 20 MG tablet Take 20 mg by mouth every morning.    Yes Historical Provider, MD  gabapentin (NEURONTIN) 300 MG capsule Take 300 mg by mouth 3 (three) times daily. 10/17/13  Yes Historical Provider, MD  hydrochlorothiazide (HYDRODIURIL) 25 MG tablet Take 25 mg by mouth every evening.   Yes Historical Provider, MD  hydrocortisone (ANUSOL-HC) 2.5 % rectal cream Place rectally 3 (three) times daily. Patient taking differently: Place 1 application rectally 3 (three) times daily.  05/08/14  Yes Hosie Poisson, MD  lactulose (CHRONULAC) 10 GM/15ML solution Take 30 mLs (20 g total) by mouth 2 (two) times daily. 05/08/14  Yes Hosie Poisson, MD  megestrol (MEGACE) 400 MG/10ML suspension Take 10 mLs (400 mg total) by mouth daily. 05/08/14  Yes Hosie Poisson, MD  methocarbamol (ROBAXIN) 500 MG tablet Take 1 tablet (500 mg total) by mouth every 8 (eight) hours as needed for muscle spasms (back pain). 05/02/14  Yes Kalman Drape, MD  nicotine (NICODERM CQ - DOSED IN MG/24 HOURS) 21 mg/24hr patch Place 1 patch (21 mg total) onto the skin daily. 05/08/14  Yes Hosie Poisson, MD  omeprazole (PRILOSEC) 20 MG capsule Take 20 mg by mouth daily with breakfast.    Yes Historical Provider, MD  oxycodone (OXY-IR) 5 MG capsule Take 1 capsule (5 mg total) by mouth every 4 (four) hours as needed for pain. Patient taking differently: Take 5 mg by mouth every 6 (six) hours as needed for pain.  05/08/14  Yes Hosie Poisson, MD  polyethylene glycol (MIRALAX / GLYCOLAX) packet Take 17 g by mouth  daily. 05/08/14  Yes Hosie Poisson, MD  QUEtiapine (SEROQUEL) 100 MG tablet Take 100 mg by mouth 2 (two) times daily.   Yes Historical Provider, MD  Thiamine HCl (B-1 PO) Take 1 capsule by mouth daily.   Yes Historical Provider, MD  zolpidem (AMBIEN) 5 MG tablet Take 5 mg by mouth at bedtime.   Yes Historical Provider, MD   BP 114/83 mmHg  Pulse 108  Temp(Src) 98.4 F (36.9 C) (Oral)  Resp 14  SpO2 99%  LMP 05/21/2012 Physical Exam  1105: Physical examination:  Nursing notes reviewed; Vital signs and O2 SAT reviewed;  Constitutional: Well developed, Well nourished, Well hydrated, In no acute distress; Head:  Normocephalic, atraumatic; Eyes: EOMI, PERRL, No scleral icterus; ENMT: Mouth and pharynx normal, Mucous membranes moist; Neck: Supple, Full range of motion, No lymphadenopathy; Cardiovascular: Regular rate and rhythm, No gallop; Respiratory: Breath sounds clear & equal bilaterally, No rales, rhonchi, wheezes.  Speaking full sentences with ease, Normal respiratory effort/excursion; Chest: Nontender, Movement normal; Abdomen: Soft, Nontender, Nondistended, Normal bowel sounds; Genitourinary: No CVA tenderness; Spine:  No midline CS, TS, LS tenderness.;; Extremities: Pulses normal, pelvis stable. +mild generalized TTP left shoulder. NT left clavicle/elbow/wrist/hand. NT left hip when distracted. NT left knee/ankle/foot. No deformity. No edema, No calf edema or asymmetry.; Neuro: AA&Ox3, Major CN grossly intact.  Speech slurred at times, but then clears when ED staff requests pt to speak clearer. Moves all extremities on stretcher spontaneously and to command without apparent gross focal motor deficits.; Skin: Color normal, Warm, Dry.   ED Course  Procedures    1110:  Pt slurring her speech, but will speak clearly when asked to. Pt recalls all events of this morning. Pt does have hx of liver disease, will check labs and UA in addition to CT/XR.   1400:  Pt remains laying on stretcher, eyes  closed. Pt will awaken to loud voice and sternal rub: pt will respond incoherently with slurred speech. Unable to arouse pt long enough to stand and walk. Pt's ALF called: pt's baseline is this same mental status, but they can arouse her and make her walk with her walker. ED staff tried again: unable to arouse her long enough to even sit up. T/C to [facility] Silos, case discussed, including:  HPI, pertinent PM/SHx, VS/PE, dx testing, ED course and treatment:  Agreeable to observation admit, requests to write temporary orders, obtain medical bed to team [facility].     EKG Interpretation None      MDM  MDM Reviewed: previous chart, nursing note and vitals Interpretation: x-ray and labs     Results for orders placed or performed during the hospital encounter of 06/07/14  Urinalysis, Routine w reflex microscopic  Result Value Ref Range   Color, Urine YELLOW YELLOW   APPearance CLEAR CLEAR   Specific Gravity, Urine 1.008 1.005 - 1.030   pH 7.0 5.0 - 8.0   Glucose, UA NEGATIVE NEGATIVE mg/dL   Hgb urine dipstick LARGE (A) NEGATIVE   Bilirubin Urine NEGATIVE NEGATIVE   Ketones, ur NEGATIVE NEGATIVE mg/dL   Protein, ur NEGATIVE NEGATIVE mg/dL   Urobilinogen, UA 1.0 0.0 - 1.0 mg/dL   Nitrite NEGATIVE NEGATIVE   Leukocytes, UA NEGATIVE NEGATIVE  Comprehensive metabolic panel  Result Value Ref Range   Sodium 139 137 - 147 mEq/L   Potassium 3.3 (L) 3.7 - 5.3 mEq/L   Chloride 103 96 - 112 mEq/L   CO2 23 19 - 32 mEq/L   Glucose, Bld 98 70 - 99 mg/dL   BUN 18 6 - 23 mg/dL   Creatinine, Ser 0.75 0.50 - 1.10 mg/dL   Calcium 9.5 8.4 - 10.5 mg/dL   Total Protein 7.4 6.0 - 8.3 g/dL   Albumin 2.4 (L) 3.5 - 5.2 g/dL   AST 81 (H) 0 - 37 U/L   ALT 29 0 - 35 U/L   Alkaline Phosphatase 170 (H) 39 - 117 U/L   Total Bilirubin 1.1 0.3 - 1.2 mg/dL   GFR calc non Af Amer >90 >  90 mL/min   GFR calc Af Amer >90 >90 mL/min   Anion gap 13 5 - 15  CBC with Differential  Result Value Ref Range   WBC  2.6 (L) 4.0 - 10.5 K/uL   RBC 3.09 (L) 3.87 - 5.11 MIL/uL   Hemoglobin 9.8 (L) 12.0 - 15.0 g/dL   HCT 29.8 (L) 36.0 - 46.0 %   MCV 96.4 78.0 - 100.0 fL   MCH 31.7 26.0 - 34.0 pg   MCHC 32.9 30.0 - 36.0 g/dL   RDW 15.0 11.5 - 15.5 %   Platelets 95 (L) 150 - 400 K/uL   Neutrophils Relative % 55 43 - 77 %   Neutro Abs 1.4 (L) 1.7 - 7.7 K/uL   Lymphocytes Relative 26 12 - 46 %   Lymphs Abs 0.7 0.7 - 4.0 K/uL   Monocytes Relative 18 (H) 3 - 12 %   Monocytes Absolute 0.5 0.1 - 1.0 K/uL   Eosinophils Relative 2 0 - 5 %   Eosinophils Absolute 0.0 0.0 - 0.7 K/uL   Basophils Relative 0 0 - 1 %   Basophils Absolute 0.0 0.0 - 0.1 K/uL  Ammonia  Result Value Ref Range   Ammonia 10 (L) 11 - 60 umol/L  Urine microscopic-add on  Result Value Ref Range   Squamous Epithelial / LPF RARE RARE   WBC, UA 0-2 <3 WBC/hpf   RBC / HPF 11-20 <3 RBC/hpf   Bacteria, UA RARE RARE   Dg Hip Complete Left 06/07/2014   CLINICAL DATA:  19 -year-old female status post fall at assisted living with acute pain and abnormal speech. Initial encounter.  EXAM: LEFT HIP - COMPLETE 2+ VIEW  COMPARISON:  Pelvis CT 12/30/2013  FINDINGS: Femoral heads normally located. SI joints within normal limits. Grossly intact proximal right femur. Proximal left femur intact. There is mild cortical irregularity at the medial aspect of the left superior pubic symphysis (arrows). This is new since June. Elsewhere the pelvis appears intact.  IMPRESSION: 1. Minimally displaced left superior pubic ramus fracture. 2. No acute fracture or dislocation identified in the proximal left femur.   Electronically Signed   By: Lars Pinks M.D.   On: 06/07/2014 11:57   Ct Head Wo Contrast 06/07/2014   CLINICAL DATA:  Very drowsy, slurred words  EXAM: CT HEAD WITHOUT CONTRAST  TECHNIQUE: Contiguous axial images were obtained from the base of the skull through the vertex without intravenous contrast.  COMPARISON:  04/25/2014  FINDINGS: There is no  evidence of mass effect, midline shift, or extra-axial fluid collections. There is no evidence of a space-occupying lesion or intracranial hemorrhage. There is no evidence of a cortical-based area of acute infarction. There is generalized cerebral atrophy. There is periventricular white matter low attenuation likely secondary to microangiopathy.  The ventricles and sulci are appropriate for the patient's age. The basal cisterns are patent.  Visualized portions of the orbits are unremarkable. The visualized portions of the paranasal sinuses and mastoid air cells are unremarkable.  The osseous structures are unremarkable. There is evidence of prior right maxillary repair and lateral orbital repair. There is an old right orbital wall fracture.  IMPRESSION: 1. No acute intracranial pathology. 2. Chronic microvascular disease and mild atrophy.   Electronically Signed   By: Kathreen Devoid   On: 06/07/2014 13:09   Dg Shoulder Left 06/07/2014   CLINICAL DATA:  Left hip and shoulder pain.  EXAM: LEFT SHOULDER - 2+ VIEW  COMPARISON:  None.  FINDINGS:  There is no evidence of fracture or dislocation. There is no evidence of arthropathy or other focal bone abnormality. Soft tissues are unremarkable.  IMPRESSION: Negative.   Electronically Signed   By: Kathreen Devoid   On: 06/07/2014 11:55      Francine Graven, DO 06/09/14 [phone]

## 2014-06-07 NOTE — ED Notes (Signed)
Patient transported to CT 

## 2014-06-07 NOTE — ED Notes (Signed)
Bed: WA22 Expected date:  Expected time:  Means of arrival:  Comments: EMS-fall 

## 2014-06-08 DIAGNOSIS — R627 Adult failure to thrive: Secondary | ICD-10-CM | POA: Diagnosis not present

## 2014-06-08 DIAGNOSIS — I10 Essential (primary) hypertension: Secondary | ICD-10-CM

## 2014-06-08 DIAGNOSIS — C220 Liver cell carcinoma: Secondary | ICD-10-CM

## 2014-06-08 LAB — GLUCOSE, CAPILLARY
Glucose-Capillary: 103 mg/dL — ABNORMAL HIGH (ref 70–99)
Glucose-Capillary: 82 mg/dL (ref 70–99)

## 2014-06-08 LAB — BASIC METABOLIC PANEL
ANION GAP: 10 (ref 5–15)
BUN: 16 mg/dL (ref 6–23)
CO2: 23 meq/L (ref 19–32)
CREATININE: 0.74 mg/dL (ref 0.50–1.10)
Calcium: 9.5 mg/dL (ref 8.4–10.5)
Chloride: 111 mEq/L (ref 96–112)
GFR calc Af Amer: >90 mL/min (ref >90)
GFR calc non Af Amer: >90 mL/min (ref >90)
GLUCOSE: 91 mg/dL (ref 70–99)
Potassium: 4.2 mEq/L (ref 3.7–5.3)
Sodium: 144 mEq/L (ref 137–147)

## 2014-06-08 LAB — URINE CULTURE
Colony Count: NO GROWTH
Culture: NO GROWTH

## 2014-06-08 LAB — CBC
HEMATOCRIT: 32.7 % — AB (ref 36.0–46.0)
Hemoglobin: 10.6 g/dL — ABNORMAL LOW (ref 12.0–15.0)
MCH: 31.2 pg (ref 26.0–34.0)
MCHC: 32.4 g/dL (ref 30.0–36.0)
MCV: 96.2 fL (ref 78.0–100.0)
PLATELETS: 105 10*3/uL — AB (ref 150–400)
RBC: 3.4 MIL/uL — AB (ref 3.87–5.11)
RDW: 14.9 % (ref 11.5–15.5)
WBC: 2.5 10*3/uL — ABNORMAL LOW (ref 4.0–10.5)

## 2014-06-08 MED ORDER — HALOPERIDOL LACTATE 5 MG/ML IJ SOLN
2.0000 mg | Freq: Once | INTRAMUSCULAR | Status: AC
  Administered 2014-06-09: 2 mg via INTRAVENOUS
  Filled 2014-06-08: qty 1

## 2014-06-08 NOTE — Progress Notes (Signed)
Met patient at bedside for discussion.  Nursing reports that patient has walked in the hallway with assist.  She has periods of agitation and impulsive behavior but has followed requests to call for assistance.  Patient in bed being fed her lunch by a Stage manager. It appears by her tray that her appetite is pretty good.  She is oriented to name and place. Discussion about what brought her to the hospital was initiated.  She states that she was very weak and having trouble walking while at [facility] and begins discussion about how strongly she disliked the facility.  She does not want to return to [facility] upon discharge.  Inquiry about social support and a place to live.  Makari reports that she has her own home and that is where she would like to return.  When the subject of her daughter [name] came up she immediately said that she did not want anyone to talk to [name] about her business, that her daughter only "wants to stir up dirt".  I restated the question about who she would like to have involved in her healthcare matters and decisions she replied, "I don't want anyone to talk to [name], you can talk to Remo Lipps (ex-boyfriend) or my son Tillman Abide.  In reviewing her chart from previous admissions there is a copy of HCPOA indicating that she appointed [name] her [place].  It appears that Keturah does have the ability to discuss her condition with some detail.  Obviously there needs to be some clarification of who she would like involved with decision making.  Symptom issues seem to be resolving as she walked the hallways with assistance earlier today.  Only reporting some mild pain to her forehead and top of her head from the recent fall at [facility].  No other distressing symptoms reported or assessed.  At the end of the conversation she was trying to position herself to get OOB but was able to refocus after being asked to stay in bed to let her lunch settle.  In light of the medical, psychiatric  and social issues surrounding this case I will contact CSW in the morning to discuss disposition plans and how palliative care can assist with the transition.  Kizzie Fantasia, RN, MSN, [facility] [phone]

## 2014-06-08 NOTE — Progress Notes (Signed)
Clinical Social Work Department BRIEF PSYCHOSOCIAL ASSESSMENT 06/08/2014  Patient:  Dorothy Burns, Dorothy Burns     Account Number:  [id number]     Admit date:  06/07/2014  Clinical Social Worker:  Glorious Peach, CLINICAL SOCIAL WORKER  Date/Time:  06/08/2014 03:10 PM  Referred by:  Physician  Date Referred:  06/08/2014 Referred for  SNF Placement   Other Referral:   Interview type:  Patient Other interview type:   and daughter via phone    PSYCHOSOCIAL DATA Living Status:  FACILITY Admitted from facility:  [facility] Level of care:  Assisted Living Primary support name:  Dorothy Burns Primary support relationship to patient:  CHILD, ADULT Degree of support available:   Adequate    CURRENT CONCERNS Current Concerns  Post-Acute Placement   Other Concerns:   Mental health    SOCIAL WORK ASSESSMENT / PLAN CSW received consult due to patient being from ALF. CSW met with patient to discuss DC plans. CSW to fax out needed clinicals to [facility] facilities.   Assessment/plan status:  Psychosocial Support/Ongoing Assessment of Needs Other assessment/ plan:   Information/referral to community resources:    PATIENT'S/FAMILY'S RESPONSE TO PLAN OF CARE: CSW met with patient at bedside. CSW introduced self and explained role. CSW inquired about patient's concerns at this time, patient explained of having a few medical and mental problems that she is dealing with. CSW spoke with patient about those concerns, patient feels better and states she will try to stay on the right track. CSW inquired about patient living situation before being admitted to the hospital. Patient confirmed of living at [facility] facility for about a week. CSW also inquired about plans once medically stable and DC. Patient reports that she does not want to return to [facility] and hopes that daughter, Dorothy Burns can find somewhere else for her to go. CSW spoke with daughter, Dorothy Burns via phone who says at  this time, that is the only facility that would accept the patient. CSW explained to the daughter that Palliative Care was consulted and would follow up with the family of medical plans. Daughter hopeful to hear from the medical team because she has ran out of options. CSW encouraged daughter to look into other facilities as a back up and when the medical team finds a set plan, CSW would help in any way possible. CSW will continue to follow.       [place] Intern    Reviewed by: Sindy Messing, LCSW [phone]

## 2014-06-08 NOTE — Evaluation (Signed)
Physical Therapy Evaluation Patient Details Name: Dorothy Burns MRN: [id number] DOB: 1955/10/18 Today's Date: 06/08/2014   History of Present Illness  Pt admitted s/p fall at nursing facility with minimally displaced L superior pubic ramus fracture. PMH: hepatic carcinoma s/p embolization, Hep C, cirrhosis, cocaine abuse, bipolar.  Clinical Impression  Patient presents with decreased independence and safety with mobility due to deficits listed in PT problem list.  She will benefit from skilled PT in the acute setting to allow maximized independence and safety.  No follow up PT due to Hospice status.  Remains high fall risk with/without walker due to poor safety awareness and impulsivity.    Follow Up Recommendations Supervision/Assistance - 24 hour;No PT follow up    Equipment Recommendations  Rolling walker with 5" wheels;3in1 (PT) (if not available in her home; inconsisten reports)    Recommendations for Other Services       Precautions / Restrictions Precautions Precautions: Fall      Mobility  Bed Mobility Overal bed mobility: Modified Independent                Transfers Overall transfer level: Needs assistance   Transfers: Sit to/from Stand Sit to Stand: Min guard         General transfer comment: assist for safety due to impulsive and poor safety awareness  Ambulation/Gait Ambulation/Gait assistance: Min assist;Min guard Ambulation Distance (Feet): 240 Feet Assistive device: Rolling walker (2 wheeled) Gait Pattern/deviations: Step-through pattern;Decreased stride length;Wide base of support;Drifts right/left     General Gait Details: walked in hall with walker; cues and assist for proximity to walker and maneuvering on turns; then wanted to go to vending to purchase [employer] (another 250') and went without walker and with min assist for safety; seems little safer without walker, but still high fall risk  Stairs            Wheelchair Mobility     Modified Rankin (Stroke Patients Only)       Balance Overall balance assessment: History of Falls;Needs assistance           Standing balance-Leahy Scale: Fair Standing balance comment: can stand static unaided, but unsafe turning or moving feet without assist                             Pertinent Vitals/Pain Pain Assessment: 0-10 Pain Score: 8  Pain Location: back Pain Intervention(s): Repositioned    Home Living Family/patient expects to be discharged to:: Assisted living               Home Equipment: Walker - 4 wheels;Bedside commode;Wheelchair - power Additional Comments: states she needs a walker and bedside commode.  Lives at [facility], but states she has a place of her own she plans to return to where her neighbor brings her food every night.    Prior Function Level of Independence: Needs assistance   Gait / Transfers Assistance Needed: was walking at nursing facility, but reports falling           Hand Dominance   Dominant Hand: Right    Extremity/Trunk Assessment               Lower Extremity Assessment: Generalized weakness         Communication   Communication: No difficulties  Cognition Arousal/Alertness: Awake/alert Behavior During Therapy: Anxious;Agitated Overall Cognitive Status: No family/caregiver present to determine baseline cognitive functioning (impulsive, yells at times, poor safety awareness)  Memory: Decreased short-term memory;Decreased recall of precautions              General Comments      Exercises        Assessment/Plan    PT Assessment Patient needs continued PT services  PT Diagnosis Generalized weakness;Abnormality of gait   PT Problem List Decreased strength;Decreased activity tolerance;Decreased balance;Decreased safety awareness;Decreased knowledge of use of DME  PT Treatment Interventions Gait training;DME instruction;Functional mobility training;Therapeutic  activities;Patient/family education;Balance training;Therapeutic exercise   PT Goals (Current goals can be found in the Care Plan section) Acute Rehab PT Goals Patient Stated Goal: to return home PT Goal Formulation: With patient Time For Goal Achievement: 06/22/14 Potential to Achieve Goals: Fair    Frequency Min 2X/week   Barriers to discharge Decreased caregiver support not sure if patient will agree to return to [facility], but unsafe for d/c back to her home without 24/7 capable assist.    Co-evaluation               End of Session Equipment Utilized During Treatment: Gait belt Activity Tolerance: Patient limited by fatigue Patient left: in bed;with bed alarm set      Functional Assessment Tool Used: Clinical Judgement Functional Limitation: Mobility: Walking and moving around Mobility: Walking and Moving Around Current Status (S0630): At least 40 percent but less than 60 percent impaired, limited or restricted Mobility: Walking and Moving Around Goal Status [phone]): At least 20 percent but less than 40 percent impaired, limited or restricted    Time: 1020-1050 PT Time Calculation (min) (ACUTE ONLY): 30 min   Charges:   PT Evaluation $Initial PT Evaluation Tier I: 1 Procedure PT Treatments $Gait Training: 8-22 mins   PT G Codes:   Functional Assessment Tool Used: Clinical Judgement Functional Limitation: Mobility: Walking and moving around    [facility] 06/08/2014, 11:00 AM [place], [place] [phone] 06/08/2014

## 2014-06-08 NOTE — Evaluation (Signed)
Occupational Therapy Evaluation Patient Details Name: Dorothy Burns MRN: [id number] DOB: 10-03-1955 Today's Date: 06/08/2014    History of Present Illness Pt admitted s/p fall at nursing facility with minimally displaced L superior pubic ramus fracture. PMH: hepatic carcinoma s/p embolization, Hep C, cirrhosis, cocaine abuse, bipolar.   Clinical Impression   Pt is overall Min guard to Min A functional mobility transfers and LB ADL's at this time. She has decreased awareness of deficits/decreased safety awareness. She will benefit from acute OT to address deficits if pt is agreeable. She will need consistent 24/7 supervision/assist at d/c. Pt is from SNF, wants to go home.    Follow Up Recommendations  No OT follow up;SNF;Supervision/Assistance - 24 hour    Equipment Recommendations  3 in 1 bedside comode    Recommendations for Other Services       Precautions / Restrictions Precautions Precautions: Fall      Mobility Bed Mobility Overal bed mobility: Modified Independent                Transfers Overall transfer level: Needs assistance Equipment used: Rolling walker (2 wheeled) Transfers: Sit to/from [facility] Sit to Stand: Supervision;Min guard Stand pivot transfers: Min guard;Min assist       General transfer comment: assist for safety due to impulsive and poor safety awareness    Balance Overall balance assessment: History of Falls;Needs assistance           Standing balance-Leahy Scale: Fair Standing balance comment: Can stand static unaided, but is unsafe turning or moving feet w/o assist.                            ADL Overall ADL's : Needs assistance/impaired     Grooming: Set up;Sitting   Upper Body Bathing: Set up;Sitting   Lower Body Bathing: Minimal assistance;Set up;Sit to/from stand       Lower Body Dressing: Supervision/safety;Sit to/from stand;Minimal assistance Lower Body Dressing Details (indicate  cue type and reason): pt able to don right sock sitting EOB, however stated she was unable to don left sock after it was partially on. Toilet Transfer: Supervision/safety;Min guard;Regular Toilet;Grab bars;RW;Ambulation;Cueing for Office manager Details (indicate cue type and reason): VC's for safety, pt is implusive Writer and Hygiene: Supervision/safety;Min guard;Sit to/from stand;Cueing for safety       Functional mobility during ADLs: Min guard;Minimal assistance;Rolling walker;Cueing for safety General ADL Comments: Pt was educated in role of OT and reluctantly agreeable to ADL retraining session after verbal education. Pt is overall Min guard to Min A functional mobility transfers and LB ADL's at this time. She has decreased awareness of deficits/decreased safety awareness. She will benefit from acute OT to address deficits if pt is agreeable. She will need 24/7 supervision/assist at d/c.     Vision  Pt has glasses in her room, is inconsistent if they are hers (she found then in hallway today, then she found them outside last night etc). Unknown visual history.                   Perception     Praxis      Pertinent Vitals/Pain Pain Assessment: 0-10 Pain Score: 8  Pain Location: back Pain Descriptors / Indicators: Sore Pain Intervention(s): Monitored during session;Repositioned     Hand Dominance Right   Extremity/Trunk Assessment Upper Extremity Assessment Upper Extremity Assessment: Generalized weakness   Lower Extremity Assessment Lower Extremity Assessment: Defer  to PT evaluation       Communication Communication Communication: No difficulties   Cognition Arousal/Alertness: Awake/alert Behavior During Therapy: Anxious;Agitated Overall Cognitive Status: No family/caregiver present to determine baseline cognitive functioning (poor safety awareness, implusive, yells at times)       Memory: Decreased short-term  memory;Decreased recall of precautions             General Comments       Exercises       Shoulder Instructions      Home Living Family/patient expects to be discharged to:: Assisted living                             Home Equipment: Walker - 4 wheels;Bedside commode;Wheelchair - power   Additional Comments: states she needs a walker and bedside commode.  Lives at [facility], but states she has a place of her own she plans to return to where her neighbor brings her food every night.      Prior Functioning/Environment Level of Independence: Needs assistance  Gait / Transfers Assistance Needed: was walking at nursing facility, but reports falling     Comments: She said that she has tried to get in home A for IADLs, but was turned down by the state    OT Diagnosis: Generalized weakness;Other (comment) (h/o falls)   OT Problem List: Decreased strength;Decreased knowledge of use of DME or AE;Decreased safety awareness;Pain;Decreased knowledge of precautions   OT Treatment/Interventions: Self-care/ADL training;Patient/family education;Therapeutic activities;Balance training    OT Goals(Current goals can be found in the care plan section) Acute Rehab OT Goals Patient Stated Goal: to return home Time For Goal Achievement: 06/15/14 Potential to Achieve Goals: Fair ADL Goals Pt Will Perform Lower Body Bathing: with supervision;sit to/from stand Pt Will Perform Lower Body Dressing: with supervision;sit to/from stand Pt Will Transfer to Toilet: with supervision;ambulating;bedside commode;grab bars Pt Will Perform Toileting - Clothing Manipulation and hygiene: with supervision;sit to/from stand  OT Frequency: Min 2X/week   Barriers to D/C: Other (comment)  Pt from SNF, is hoping to go home. Will require 24/7 supervision and competent assist is she does not go back to SNF       Co-evaluation              End of Session Equipment Utilized During Treatment:  Gait belt;Rolling walker Nurse Communication: Mobility status  Activity Tolerance: Patient tolerated treatment well Patient left: in bed;with call bell/phone within reach;with nursing/sitter in room   Time: 1100-1118 OT Time Calculation (min): 18 min Charges:  OT General Charges $OT Visit: 1 Procedure OT Evaluation $Initial OT Evaluation Tier I: 1 Procedure (10 min) OT Treatments $Self Care/Home Management : 8-22 mins (8 min) G-Codes: OT G-codes **NOT FOR INPATIENT CLASS** Functional Assessment Tool Used: Clinical judgement Functional Limitation: Self care Self Care Current Status (N2355): At least 40 percent but less than 60 percent impaired, limited or restricted Self Care Goal Status (D3220): At least 20 percent but less than 40 percent impaired, limited or restricted  Almyra Deforest, OT 06/08/2014, 11:36 AM

## 2014-06-08 NOTE — Progress Notes (Signed)
[facility] PROGRESS NOTE  Dorothy Burns GYI:[id number] DOB: 09/01/1955 DOA: 06/07/2014 PCP: Barbette Merino, MD  Assessment/Plan: #1 failure to thrive Likely secondary to hepatocellular carcinoma, hepatitis C, cirrhosis, dehydration. Chest x-ray negative for any acute infiltrate. Urinalysis is negative. Patient is currently a DO NOT RESUSCITATE. Palliative care consultation pending. During last hospitalization per discharging physician patient refused to see oncology. Patient has missed a couple of appointments for outpatient follow-up with oncology. Continue IV fluids and supportive care for now. PT/OT. Patient likely needs to go back to a skilled nursing facility versus home with 24/ 7 supervision.  #2 hypokalemia Repleted.  #3 pancytopenia Chronic in nature. Likely secondary to cirrhosis. Stable.  #4 dehydration IV fluids.  #5 minimally displaced left superior pubic ramus fracture Secondary to mechanical fall. Pain management. PT/OT.  #6 hepatocellular carcinoma/liver cirrhosis/hepatitis C Per prior discharge summary patient refused to be followed by oncologist and no set prognosis without treatment. Patient has missed appointments to follow-up with oncology. Continue lactulose. Continue pain management. Palliative care consultation pending.  #7 bipolar disorder Stable. Continue home regimen of Depakote, Seroquel, Klonopin.  #8 protein calorie malnutrition Continue Megace.  #9 prophylaxis PPI for GI prophylaxis. SCDs for DVT prophylaxis.    Code Status: DO NOT RESUSCITATE Family Communication: Updated patient no family at bedside. Disposition Plan: Back to SNF when medically stable.   Consultants:  Palliative care pending  Procedures:  Chest x-ray 06/07/2014  CT head without contrast 06/07/2014  X-ray of the left hip 06/07/2014  X-ray of the left shoulder 06/07/2014  Antibiotics:  None  HPI/Subjective: Patient more alert. Patient complaining of  pain.  Objective: Filed Vitals:   06/08/14 1000  BP: 116/73  Pulse: 110  Temp: 98 F (36.7 C)  Resp: 20    Intake/Output Summary (Last 24 hours) at 06/08/14 1349 Last data filed at 06/08/14 0500  Gross per 24 hour  Intake 1181.67 ml  Output      0 ml  Net 1181.67 ml   There were no vitals filed for this visit.  Exam:   General:  NAD  Cardiovascular: Tachycardic, regular rhythm  Respiratory: Clear to auscultation bilaterally  Abdomen: Soft, nontender, nondistended, bowel sounds.  Musculoskeletal: No clubbing cyanosis or edema.  Data Reviewed: Basic Metabolic Panel:  Recent Labs Lab 06/07/14 1205 06/07/14 1400 06/08/14 0443  NA 139  --  144  K 3.3*  --  4.2  CL 103  --  111  CO2 23  --  23  GLUCOSE 98  --  91  BUN 18  --  16  CREATININE 0.75  --  0.74  CALCIUM 9.5  --  9.5  MG  --  2.1  --    Liver Function Tests:  Recent Labs Lab 06/07/14 1205  AST 81*  ALT 29  ALKPHOS 170*  BILITOT 1.1  PROT 7.4  ALBUMIN 2.4*   No results for input(s): LIPASE, AMYLASE in the last 168 hours.  Recent Labs Lab 06/07/14 1206  AMMONIA 10*   CBC:  Recent Labs Lab 06/07/14 1205 06/08/14 0443  WBC 2.6* 2.5*  NEUTROABS 1.4*  --   HGB 9.8* 10.6*  HCT 29.8* 32.7*  MCV 96.4 96.2  PLT 95* 105*   Cardiac Enzymes: No results for input(s): CKTOTAL, CKMB, CKMBINDEX, TROPONINI in the last 168 hours. BNP (last 3 results) No results for input(s): PROBNP in the last 8760 hours. CBG:  Recent Labs Lab 06/07/14 1954  GLUCAP 153*    No results found for this  or any previous visit (from the past 240 hour(s)).   Studies: Dg Hip Complete Left  06/07/2014   CLINICAL DATA:  29 -year-old female status post fall at assisted living with acute pain and abnormal speech. Initial encounter.  EXAM: LEFT HIP - COMPLETE 2+ VIEW  COMPARISON:  Pelvis CT 12/30/2013  FINDINGS: Femoral heads normally located. SI joints within normal limits. Grossly intact proximal  right femur. Proximal left femur intact. There is mild cortical irregularity at the medial aspect of the left superior pubic symphysis (arrows). This is new since June. Elsewhere the pelvis appears intact.  IMPRESSION: 1. Minimally displaced left superior pubic ramus fracture. 2. No acute fracture or dislocation identified in the proximal left femur.   Electronically Signed   By: Lars Pinks M.D.   On: 06/07/2014 11:57   Ct Head Wo Contrast  06/07/2014   CLINICAL DATA:  Very drowsy, slurred words  EXAM: CT HEAD WITHOUT CONTRAST  TECHNIQUE: Contiguous axial images were obtained from the base of the skull through the vertex without intravenous contrast.  COMPARISON:  04/25/2014  FINDINGS: There is no evidence of mass effect, midline shift, or extra-axial fluid collections. There is no evidence of a space-occupying lesion or intracranial hemorrhage. There is no evidence of a cortical-based area of acute infarction. There is generalized cerebral atrophy. There is periventricular white matter low attenuation likely secondary to microangiopathy.  The ventricles and sulci are appropriate for the patient's age. The basal cisterns are patent.  Visualized portions of the orbits are unremarkable. The visualized portions of the paranasal sinuses and mastoid air cells are unremarkable.  The osseous structures are unremarkable. There is evidence of prior right maxillary repair and lateral orbital repair. There is an old right orbital wall fracture.  IMPRESSION: 1. No acute intracranial pathology. 2. Chronic microvascular disease and mild atrophy.   Electronically Signed   By: Kathreen Devoid   On: 06/07/2014 13:09   Dg Chest Port 1 View  06/07/2014   CLINICAL DATA:  58 year old with failure to thrive  EXAM: PORTABLE CHEST - 1 VIEW  COMPARISON:  05/02/2014 and earlier.  FINDINGS: The cardiac silhouette and mediastinal contours are within normal limits.  There is no focal airspace consolidation, pleural effusion or pneumothorax.  Linear bibasilar opacities likely corresponds to atelectasis. There is no pleural effusion or pneumothorax.  Metallic clips project of the right upper quadrant.  Degenerative changes of the spine are noted.  IMPRESSION: Mild bibasilar atelectasis.  No acute cardiopulmonary disease.   Electronically Signed   By: Rosemarie Ax   On: 06/07/2014 15:42   Dg Shoulder Left  06/07/2014   CLINICAL DATA:  Left hip and shoulder pain.  EXAM: LEFT SHOULDER - 2+ VIEW  COMPARISON:  None.  FINDINGS: There is no evidence of fracture or dislocation. There is no evidence of arthropathy or other focal bone abnormality. Soft tissues are unremarkable.  IMPRESSION: Negative.   Electronically Signed   By: Kathreen Devoid   On: 06/07/2014 11:55    Scheduled Meds: . antiseptic oral rinse  7 mL Mouth Rinse q12n4p  . aspirin  81 mg Oral Daily  . budesonide-formoterol  2 puff Inhalation BID  . chlorhexidine  15 mL Mouth Rinse BID  . clonazePAM  2 mg Oral BID  . colchicine  0.6 mg Oral Q breakfast  . divalproex  250 mg Oral BID  . docusate sodium  100 mg Oral BID  . gabapentin  300 mg Oral TID  . hydrocortisone  Rectal TID  . lactulose  20 g Oral BID  . megestrol  400 mg Oral Daily  . nicotine  21 mg Transdermal Daily  . pantoprazole  40 mg Oral Daily  . polyethylene glycol  17 g Oral Daily  . QUEtiapine  100 mg Oral BID  . sodium chloride  500 mL Intravenous Once  . thiamine  100 mg Oral Daily   Continuous Infusions: . sodium chloride Stopped (06/07/14 1531)    Principal Problem:   FTT (failure to thrive) in adult Active Problems:   DSORD BIPOLAR I, UNSPC, MOST RECENT EPSD   HYPERTENSION, BENIGN ESSENTIAL   Cancer, hepatocellular   Liver cirrhosis   Protein-calorie malnutrition, severe   Dehydration   Altered mental state   Closed fracture of superior pubic ramus   Fall   Pancytopenia   Hypokalemia   Closed fracture of left superior pubic ramus    Time spent: 35  minutes    [facility] MD [facility] Pager [phone]. If 7PM-7AM, please contact night-coverage at [email or web address], password [facility] 06/08/2014, 1:49 PM  LOS: 1 day

## 2014-06-08 NOTE — Progress Notes (Signed)
UR completed 

## 2014-06-09 DIAGNOSIS — R627 Adult failure to thrive: Secondary | ICD-10-CM | POA: Diagnosis not present

## 2014-06-09 LAB — BASIC METABOLIC PANEL
ANION GAP: 12 (ref 5–15)
BUN: 14 mg/dL (ref 6–23)
CALCIUM: 9.4 mg/dL (ref 8.4–10.5)
CO2: 23 meq/L (ref 19–32)
Chloride: 109 mEq/L (ref 96–112)
Creatinine, Ser: 0.79 mg/dL (ref 0.50–1.10)
GFR calc Af Amer: >90 mL/min (ref >90)
GFR calc non Af Amer: >90 mL/min (ref >90)
Glucose, Bld: 96 mg/dL (ref 70–99)
POTASSIUM: 4.5 meq/L (ref 3.7–5.3)
SODIUM: 144 meq/L (ref 137–147)

## 2014-06-09 LAB — CBC
HEMATOCRIT: 31.5 % — AB (ref 36.0–46.0)
Hemoglobin: 10.1 g/dL — ABNORMAL LOW (ref 12.0–15.0)
MCH: 31.2 pg (ref 26.0–34.0)
MCHC: 32.1 g/dL (ref 30.0–36.0)
MCV: 97.2 fL (ref 78.0–100.0)
Platelets: 104 10*3/uL — ABNORMAL LOW (ref 150–400)
RBC: 3.24 MIL/uL — AB (ref 3.87–5.11)
RDW: 14.8 % (ref 11.5–15.5)
WBC: 2.5 10*3/uL — AB (ref 4.0–10.5)

## 2014-06-09 MED ORDER — OXYCODONE HCL 5 MG PO CAPS: 5.0000 mg | ORAL_CAPSULE | Freq: Four times a day (QID) | ORAL | Status: DC | PRN

## 2014-06-09 MED ORDER — ENSURE COMPLETE PO LIQD: 237.0000 mL | Freq: Three times a day (TID) | ORAL | Status: DC

## 2014-06-09 MED ORDER — ENSURE COMPLETE PO LIQD
237.0000 mL | Freq: Three times a day (TID) | ORAL | Status: DC
  Administered 2014-06-09: 237 mL via ORAL

## 2014-06-09 MED ORDER — GUAIFENESIN-DM 100-10 MG/5ML PO SYRP: 5.0000 mL | ORAL_SOLUTION | ORAL | Status: DC | PRN

## 2014-06-09 MED ORDER — DSS 100 MG PO CAPS: 100.0000 mg | ORAL_CAPSULE | Freq: Two times a day (BID) | ORAL | Status: DC

## 2014-06-09 MED ORDER — PSEUDOEPHEDRINE HCL ER 120 MG PO TB12
120.0000 mg | ORAL_TABLET | Freq: Two times a day (BID) | ORAL | Status: DC
  Administered 2014-06-09: 120 mg via ORAL
  Filled 2014-06-09: qty 1

## 2014-06-09 MED ORDER — CLONAZEPAM 2 MG PO TABS: 2.0000 mg | ORAL_TABLET | Freq: Two times a day (BID) | ORAL | Status: DC

## 2014-06-09 MED ORDER — PSEUDOEPHEDRINE HCL ER 120 MG PO TB12: 120.0000 mg | ORAL_TABLET | Freq: Two times a day (BID) | ORAL | Status: DC

## 2014-06-09 MED ORDER — ALBUTEROL SULFATE (2.5 MG/3ML) 0.083% IN NEBU: 2.5000 mg | INHALATION_SOLUTION | RESPIRATORY_TRACT | Status: AC | PRN

## 2014-06-09 MED ORDER — LORATADINE 10 MG PO TABS
10.0000 mg | ORAL_TABLET | Freq: Every day | ORAL | Status: DC
  Administered 2014-06-09: 10 mg via ORAL
  Filled 2014-06-09: qty 1

## 2014-06-09 MED ORDER — LORATADINE 10 MG PO TABS: 10.0000 mg | ORAL_TABLET | Freq: Every day | ORAL | Status: DC

## 2014-06-09 NOTE — Progress Notes (Signed)
CARE MANAGEMENT NOTE 06/09/2014  Patient:  Dorothy Burns, Dorothy Burns   Account Number:  [id number]  Date Initiated:  06/09/2014  Documentation initiated by:  Edwyna Shell  Subjective/Objective Assessment:   58 yo female admitted with FTT from [facility], ALF     Action/Plan:   discharge planning   Anticipated DC Date:  06/10/2014   Anticipated DC Plan:  ASSISTED LIVING / [place] referral  Clinical Social Worker      DC Planning Services  CM consult      Choice offered to / List presented to:  C-4 Adult Children           HH agency  [facility]   Status of service:  Completed, signed off Medicare Important Message given?   (If response is "NO", the following Medicare IM given date fields will be blank) Date Medicare IM given:   Medicare IM given by:   Date Additional Medicare IM given:   Additional Medicare IM given by:    Discharge Disposition:  ASSISTED LIVING  Per UR Regulation:    If discussed at Long Length of Stay Meetings, dates discussed:    Comments:  06/09/14 Edwyna Shell RN, BSN, [place] [phone] Spoke with Kizzie Fantasia, Palliative Care RN, and she stated that the Palliative Care recommendation is for the patient to be followed by Palliative Care in the community. [place] and they stated that they work with [facility]. Spoke with the patient daughter and POA, [name], and she agrees with Palliative Care services in the facility for added support and symptom management and she understands that the facility prefers [facility] and after choice offered she agreed with facility choice of [facility]. Called th referral office for [place] at [phone], spoke with Jocelyn Lamer, and she stated that she will contact the facility to receive the order from the following physician for Palliative care to follow in the facility.

## 2014-06-09 NOTE — Progress Notes (Signed)
INITIAL NUTRITION ASSESSMENT  DOCUMENTATION CODES Per approved criteria  -Severe malnutrition in the context of chronic illness  Pt meets criteria for severe MALNUTRITION in the context of chronic illness as evidenced by 14% body weight loss in 6 months, PO intake < 75% for > one month, severe muscle wasting and fat loss in occipital and temporal regions.   INTERVENTION: -Recommend Ensure Complete po TID, each supplement provides 350 kcal and 13 grams of protein -Recommend diet liberalization -RD to continue to monitor   NUTRITION DIAGNOSIS: Increased nutrient needs  related to chronic disease as evidenced by hepatocellular cancer/Hep C/cirrohosis.   Goal: Pt to meet >/= 90% of their estimated nutrition needs    Monitor:  Total protein/energy intake, labs, weights  Reason for Assessment: MST  58 y.o. female  Admitting Dx: FTT (failure to thrive) in adult  ASSESSMENT: Dorothy Burns is a 58 y.o. female  With history of hepatocellular carcinoma status post embolization, hep C, cirrhosis, history of cocaine abuse, bipolar disorder who was recently hospitalized from 05/02/2014 - 05/08/2014, for abdominal pain and pain control who presents from nursing facility after a fall with generalized weakness.  -Pt reported poor PO intake for > one month d/t weakness and lethargy. Diet recall indicates pt consuming 50% of meals, and drinks Ensure twice daily -Has had ongoing weight loss. Previous medical records indicates pt with 7 lb weight loss in one month(5% body weight, significant for time frame), and > 20 lbs in 6 months (14% body weight, severe for time frame) -Pt's PO intake 50-100%, dislikes HH diet choices, and would benefit from diet liberalization -Provided pt with vanilla Ensure, which she consumed 100% with the assistance of RD -Palliative care consult pending  Height: Ht Readings from Last 1 Encounters:  05/03/14 5\' 3"  (1.6 m)    Weight: Wt Readings from Last 1  Encounters:  05/06/14 133 lb 11.2 oz (60.646 kg)    Ideal Body Weight: 115 lb  % Ideal Body Weight: 116%  Wt Readings from Last 10 Encounters:  05/06/14 133 lb 11.2 oz (60.646 kg)  04/18/14 140 lb (63.504 kg)  04/11/14 128 lb 4.8 oz (58.196 kg)  03/23/14 140 lb (63.504 kg)  01/25/14 152 lb (68.947 kg)  01/16/14 152 lb (68.947 kg)  12/29/13 156 lb (70.762 kg)  12/23/13 154 lb 14.4 oz (70.262 kg)  12/22/13 156 lb (70.761 kg)  12/16/13 156 lb 14.4 oz (71.169 kg)    Usual Body Weight: pt unable to determine  % Usual Body Weight: unable to determine  BMI:  There is no weight on file to calculate BMI.  Estimated Nutritional Needs: Kcal: 1800-2000 Protein: 90-100 gram Fluid: >/= 1800 ml daily  Skin: WDL, ecchymosis  Diet Order: Diet Heart  EDUCATION NEEDS: -No education needs identified at this time   Intake/Output Summary (Last 24 hours) at 06/09/14 1550 Last data filed at 06/09/14 1228  Gross per 24 hour  Intake   1040 ml  Output      0 ml  Net   1040 ml    Last BM: 11/20   Labs:   Recent Labs Lab 06/07/14 1205 06/07/14 1400 06/08/14 0443 06/09/14 0500  NA 139  --  144 144  K 3.3*  --  4.2 4.5  CL 103  --  111 109  CO2 23  --  23 23  BUN 18  --  16 14  CREATININE 0.75  --  0.74 0.79  CALCIUM 9.5  --  9.5 9.4  MG  --  2.1  --   --   GLUCOSE 98  --  91 96    CBG (last 3)   Recent Labs  06/07/14 1954 06/08/14 1637 06/08/14 2107  GLUCAP 153* 103* 82    Scheduled Meds: . antiseptic oral rinse  7 mL Mouth Rinse q12n4p  . aspirin  81 mg Oral Daily  . budesonide-formoterol  2 puff Inhalation BID  . chlorhexidine  15 mL Mouth Rinse BID  . clonazePAM  2 mg Oral BID  . colchicine  0.6 mg Oral Q breakfast  . divalproex  250 mg Oral BID  . docusate sodium  100 mg Oral BID  . gabapentin  300 mg Oral TID  . hydrocortisone   Rectal TID  . lactulose  20 g Oral BID  . megestrol  400 mg Oral Daily  . nicotine  21 mg Transdermal Daily  .  pantoprazole  40 mg Oral Daily  . polyethylene glycol  17 g Oral Daily  . QUEtiapine  100 mg Oral BID  . sodium chloride  500 mL Intravenous Once  . thiamine  100 mg Oral Daily    Continuous Infusions: . sodium chloride Stopped (06/07/14 1531)    Past Medical History  Diagnosis Date  . Hypertension   . Bipolar 1 disorder   . Menorrhagia   . Post-menopausal bleeding     MOST RECENT BLEEDING  WAS NOV 2013  . Paresthesia   . COPD (chronic obstructive pulmonary disease)   . 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 Gout   . Mental disorder     bipolar  . Asthma   . Shortness of breath   . Anemia   . GERD (gastroesophageal reflux disease)   . Gallstones     ABD PAIN, N&V  . Arthritis   . Headache(784.0)   . Hepatitis     Hep C x 10 yrs  - NO TREATMENT  . PONV (postoperative nausea and vomiting)   . Cirrhosis   . Cancer   . Liver cancer   . Pancytopenia 06/07/2014    Past Surgical History  Procedure Laterality Date  . Tubal ligation    . Facial reconstruction surgery    . Multiple tooth extractions    . Hysteroscopy w/d&c  09/29/2011    Procedure: DILATATION AND CURETTAGE /HYSTEROSCOPY;  Surgeon: Mora Bellman, MD;  Location: [facility];  Service: Gynecology;  Laterality: N/A;  . Cholecystectomy  08/20/2012    Procedure: LAPAROSCOPIC CHOLECYSTECTOMY;  Surgeon: Ralene Ok, MD;  Location: [facility];  Service: General;;  . Embolization of dominant hcc within left lobe of liver  03/23/14    Atlee Abide Alexis LDN Clinical Dietitian [phone]

## 2014-06-09 NOTE — Progress Notes (Signed)
Agitated/impulsive, poor safety awareness.  Pulled out iv, refused re stick.  Haldol 2mg  adm at 0004 with good relief noted.

## 2014-06-09 NOTE — Discharge Summary (Addendum)
Physician Discharge Summary  Dorothy Burns HFW:[id number] DOB: 01-17-1956 DOA: 06/07/2014  PCP: Barbette Merino, MD  Admit date: 06/07/2014 Discharge date: 06/09/2014  Time spent: 65 minutes  Recommendations for Outpatient Follow-up:  1. Patient be discharged to  to an assisted living facility with palliative care/hospice following. Patient will need a basic metabolic profile done one week post discharge to follow-up on electrolytes and renal function.  Discharge Diagnoses:  Principal Problem:   FTT (failure to thrive) in adult Active Problems:   DSORD BIPOLAR I, UNSPC, MOST RECENT EPSD   HYPERTENSION, BENIGN ESSENTIAL   Cancer, hepatocellular   Liver cirrhosis   Protein-calorie malnutrition, severe   Dehydration   Altered mental state   Closed fracture of superior pubic ramus   Fall   Pancytopenia   Hypokalemia   Closed fracture of left superior pubic ramus   Discharge Condition: Stable  Diet recommendation: Regular  There were no vitals filed for this visit.  History of present illness:  Dorothy Burns is a 58 y.o. female  With history of hepatocellular carcinoma status post embolization, hep C, cirrhosis, history of cocaine abuse, bipolar disorder who was recently hospitalized from 05/02/2014 - 05/08/2014, for abdominal pain and pain control who presented from nursing facility after a fall with generalized weakness. Patient was somewhat drowsy, however answering questions appropriately. Patient stated she fell off the bed and landed on her left side started to have some pain around 6:30 on the morning of admission. Patient stated that she was trying to get to the bathroom and she felt too weak to even crawl or walk to the bathroom. Patient stated that she subsequently peed on the floor and was cursed at by the staff at the facility. Patient was subsequently uploaded into a truck and brought to the emergency room. Patient denied any fevers, no chills, no nausea, no vomiting,  no chest pain, no dysuria, no melena, no hematemesis, no hematochezia, no diarrhea, no constipation, no cough. Patient did endorse a chronic shortness of breath as well as some generalized weakness. Patient was seen in the emergency room, UDS which was done was negative. Alcohol level was less than 11. Ammonia level was 10. Comprehensive metabolic profile had a potassium of 3.3 alk phosphatase of 170, albumin of 2.4 AST of 81 otherwise was within normal limits. CBC had a white count of 2.6 hemoglobin of 9.8 and a platelet count of 95.Depakote level was 64.9. Glucose level was 98.CT of the head which was done was negative. X-ray of the left hip showed a minimally displaced left superior pubic ramus fracture. No acute fracture or dislocation identified in the proximal left femur. X-ray of the left shoulder was negative. Urinalysis which was done was nitrite negative, leukocytes negative, -0-2 WBCs. ED physician stated that they try to ambulate the patient however patient was too weak to be ambulated and as such [facility] hospitalist were called to admit the patient for further evaluation and management.  Hospital Course:  #1 failure to thrive Likely secondary to hepatocellular carcinoma, hepatitis C, cirrhosis, dehydration. Chest x-ray negative for any acute infiltrate. Urinalysis is negative. Patient is currently a DO NOT RESUSCITATE. Palliative care consultation was obtained and patient will be followed at facility by palliative care/hospice. During last hospitalization per discharging physician patient refused to see oncology. Patient has missed a couple of appointments for outpatient follow-up with oncology. Patient was maintained on IV fluids and supportive care. Patient improved clinically. Patient was seen by physical therapy. Patient will be discharged back  to the facility with palliative care/hospice following.   #2 hypokalemia Repleted.  #3 pancytopenia Chronic in nature. Likely secondary to  cirrhosis. Remained stable throughout the hospitalization.   #4 dehydration Patient's diuretics were held. Patient was hydrated with IV fluids was euvolemic by day of discharge.   #5 minimally displaced left superior pubic ramus fracture Secondary to mechanical fall. Pain management. PT/OT.  #6 hepatocellular carcinoma/liver cirrhosis/hepatitis C Per prior discharge summary patient refused to be followed by oncologist and poor prognosis without treatment. Patient has missed appointments to follow-up with oncology. Patient was continued on lactulose during the hospitalization. Patient's pain was managed. Patient was seen in consultation by palliative care and patient will be followed up at the facility with palliative care/hospice.   #7 bipolar disorder Stable. Continued on home regimen of Depakote, Seroquel, Klonopin.  #8 protein calorie malnutrition Continued on Megace.   Procedures:  Chest x-ray 06/07/2014  CT head without contrast 06/07/2014  X-ray of the left hip 06/07/2014  X-ray of the left shoulder 06/07/2014  Consultations:  Palliative care: Kizzie Fantasia, RN 06/08/2014  Discharge Exam: Filed Vitals:   06/09/14 1405  BP: 111/73  Pulse: 73  Temp: 98 F (36.7 C)  Resp: 20    General: NAD Cardiovascular: RRR Respiratory: Some coarse BS  Discharge Instructions You were cared for by a hospitalist during your hospital stay. If you have any questions about your discharge medications or the care you received while you were in the hospital after you are discharged, you can call the unit and asked to speak with the hospitalist on call if the hospitalist that took care of you is not available. Once you are discharged, your primary care physician will handle any further medical issues. Please note that NO REFILLS for any discharge medications will be authorized once you are discharged, as it is imperative that you return to your primary care physician (or establish a  relationship with a primary care physician if you do not have one) for your aftercare needs so that they can reassess your need for medications and monitor your lab values.  Discharge Instructions    Diet general    Complete by:  As directed      Discharge instructions    Complete by:  As directed   Patient will need palliative care/Hospice to follow at facility. Follow up with MD at SNF.     Increase activity slowly    Complete by:  As directed           Current Discharge Medication List    START taking these medications   Details  albuterol (PROVENTIL) (2.5 MG/3ML) 0.083% nebulizer solution Take 3 mLs (2.5 mg total) by nebulization every 2 (two) hours as needed for wheezing. Qty: 75 mL, Refills: 12    docusate sodium 100 MG CAPS Take 100 mg by mouth 2 (two) times daily. Qty: 10 capsule, Refills: 0    feeding supplement, ENSURE COMPLETE, (ENSURE COMPLETE) LIQD Take 237 mLs by mouth 3 (three) times daily with meals. Qty: 90 Bottle, Refills: 0    guaiFENesin-dextromethorphan (ROBITUSSIN DM) 100-10 MG/5ML syrup Take 5 mLs by mouth every 4 (four) hours as needed for cough. Qty: 118 mL, Refills: 0    loratadine (CLARITIN) 10 MG tablet Take 1 tablet (10 mg total) by mouth daily. Take for 5 days. Qty: 5 tablet, Refills: 0    pseudoephedrine (SUDAFED) 120 MG 12 hr tablet Take 1 tablet (120 mg total) by mouth 2 (two) times daily. Take  for 5 days then stop. Qty: 10 tablet, Refills: 0      CONTINUE these medications which have CHANGED   Details  clonazePAM (KLONOPIN) 2 MG tablet Take 1 tablet (2 mg total) by mouth 2 (two) times daily. Qty: 30 tablet, Refills: 0    oxycodone (OXY-IR) 5 MG capsule Take 1 capsule (5 mg total) by mouth every 6 (six) hours as needed for pain. Qty: 20 capsule, Refills: 0      CONTINUE these medications which have NOT CHANGED   Details  aspirin 81 MG tablet Take 81 mg by mouth daily.     budesonide-formoterol (SYMBICORT) 160-4.5 MCG/ACT inhaler  Inhale 2 puffs into the lungs 2 (two) times daily.    colchicine 0.6 MG tablet Take 0.6 mg by mouth daily with breakfast.     divalproex (DEPAKOTE) 250 MG DR tablet Take 250 mg by mouth 2 (two) times daily.    furosemide (LASIX) 20 MG tablet Take 20 mg by mouth every morning.     gabapentin (NEURONTIN) 300 MG capsule Take 300 mg by mouth 3 (three) times daily.   Associated Diagnoses: Other pancytopenia; Weight loss; Elevated liver function tests    hydrocortisone (ANUSOL-HC) 2.5 % rectal cream Place rectally 3 (three) times daily. Qty: 30 g, Refills: 0    lactulose (CHRONULAC) 10 GM/15ML solution Take 30 mLs (20 g total) by mouth 2 (two) times daily. Qty: 240 mL, Refills: 0    megestrol (MEGACE) 400 MG/10ML suspension Take 10 mLs (400 mg total) by mouth daily. Qty: 240 mL, Refills: 0    methocarbamol (ROBAXIN) 500 MG tablet Take 1 tablet (500 mg total) by mouth every 8 (eight) hours as needed for muscle spasms (back pain). Qty: 40 tablet, Refills: 0    nicotine (NICODERM CQ - DOSED IN MG/24 HOURS) 21 mg/24hr patch Place 1 patch (21 mg total) onto the skin daily. Qty: 28 patch, Refills: 0    omeprazole (PRILOSEC) 20 MG capsule Take 20 mg by mouth daily with breakfast.     polyethylene glycol (MIRALAX / GLYCOLAX) packet Take 17 g by mouth daily. Qty: 14 each, Refills: 0    QUEtiapine (SEROQUEL) 100 MG tablet Take 100 mg by mouth 2 (two) times daily.    Thiamine HCl (B-1 PO) Take 1 capsule by mouth daily.    zolpidem (AMBIEN) 5 MG tablet Take 5 mg by mouth at bedtime.      STOP taking these medications     hydrochlorothiazide (HYDRODIURIL) 25 MG tablet        Allergies  Allergen Reactions  . Nsaids Other (See Comments)    Liver issues   . Tylenol [Acetaminophen] Other (See Comments)    Liver issues   Follow-up Information    Please follow up.   Why:  f/u with palliative/hospice at facility      Please follow up.   Why:  f/u with MD at SNF       The results of  significant diagnostics from this hospitalization (including imaging, microbiology, ancillary and laboratory) are listed below for reference.    Significant Diagnostic Studies: Dg Hip Complete Left  06/07/2014   CLINICAL DATA:  89 -year-old female status post fall at assisted living with acute pain and abnormal speech. Initial encounter.  EXAM: LEFT HIP - COMPLETE 2+ VIEW  COMPARISON:  Pelvis CT 12/30/2013  FINDINGS: Femoral heads normally located. SI joints within normal limits. Grossly intact proximal right femur. Proximal left femur intact. There is mild cortical irregularity at  the medial aspect of the left superior pubic symphysis (arrows). This is new since June. Elsewhere the pelvis appears intact.  IMPRESSION: 1. Minimally displaced left superior pubic ramus fracture. 2. No acute fracture or dislocation identified in the proximal left femur.   Electronically Signed   By: Lars Pinks M.D.   On: 06/07/2014 11:57   Ct Head Wo Contrast  06/07/2014   CLINICAL DATA:  Very drowsy, slurred words  EXAM: CT HEAD WITHOUT CONTRAST  TECHNIQUE: Contiguous axial images were obtained from the base of the skull through the vertex without intravenous contrast.  COMPARISON:  04/25/2014  FINDINGS: There is no evidence of mass effect, midline shift, or extra-axial fluid collections. There is no evidence of a space-occupying lesion or intracranial hemorrhage. There is no evidence of a cortical-based area of acute infarction. There is generalized cerebral atrophy. There is periventricular white matter low attenuation likely secondary to microangiopathy.  The ventricles and sulci are appropriate for the patient's age. The basal cisterns are patent.  Visualized portions of the orbits are unremarkable. The visualized portions of the paranasal sinuses and mastoid air cells are unremarkable.  The osseous structures are unremarkable. There is evidence of prior right maxillary repair and lateral orbital repair. There is  an old right orbital wall fracture.  IMPRESSION: 1. No acute intracranial pathology. 2. Chronic microvascular disease and mild atrophy.   Electronically Signed   By: Kathreen Devoid   On: 06/07/2014 13:09   Dg Chest Port 1 View  06/07/2014   CLINICAL DATA:  58 year old with failure to thrive  EXAM: PORTABLE CHEST - 1 VIEW  COMPARISON:  05/02/2014 and earlier.  FINDINGS: The cardiac silhouette and mediastinal contours are within normal limits.  There is no focal airspace consolidation, pleural effusion or pneumothorax. Linear bibasilar opacities likely corresponds to atelectasis. There is no pleural effusion or pneumothorax.  Metallic clips project of the right upper quadrant.  Degenerative changes of the spine are noted.  IMPRESSION: Mild bibasilar atelectasis.  No acute cardiopulmonary disease.   Electronically Signed   By: Rosemarie Ax   On: 06/07/2014 15:42   Dg Shoulder Left  06/07/2014   CLINICAL DATA:  Left hip and shoulder pain.  EXAM: LEFT SHOULDER - 2+ VIEW  COMPARISON:  None.  FINDINGS: There is no evidence of fracture or dislocation. There is no evidence of arthropathy or other focal bone abnormality. Soft tissues are unremarkable.  IMPRESSION: Negative.   Electronically Signed   By: Kathreen Devoid   On: 06/07/2014 11:55    Microbiology: Recent Results (from the past 240 hour(s))  Urine culture     Status: None   Collection Time: 06/07/14 12:00 PM  Result Value Ref Range Status   Specimen Description URINE, CATHETERIZED  Final   Special Requests NONE  Final   Culture  Setup Time   Final    06/07/2014 15:16 Performed at [facility] Performed at [facility]   Final   Culture NO GROWTH Performed at [facility]   Final   Report Status 06/08/2014 FINAL  Final     Labs: Basic Metabolic Panel:  Recent Labs Lab 06/07/14 1205 06/07/14 1400 06/08/14 0443 06/09/14 0500  NA 139  --  144 144  K 3.3*  --  4.2 4.5  CL 103  --   111 109  CO2 23  --  23 23  GLUCOSE 98  --  91 96  BUN 18  --  16 14  CREATININE 0.75  --  0.74 0.79  CALCIUM 9.5  --  9.5 9.4  MG  --  2.1  --   --    Liver Function Tests:  Recent Labs Lab 06/07/14 1205  AST 81*  ALT 29  ALKPHOS 170*  BILITOT 1.1  PROT 7.4  ALBUMIN 2.4*   No results for input(s): LIPASE, AMYLASE in the last 168 hours.  Recent Labs Lab 06/07/14 1206  AMMONIA 10*   CBC:  Recent Labs Lab 06/07/14 1205 06/08/14 0443 06/09/14 0500  WBC 2.6* 2.5* 2.5*  NEUTROABS 1.4*  --   --   HGB 9.8* 10.6* 10.1*  HCT 29.8* 32.7* 31.5*  MCV 96.4 96.2 97.2  PLT 95* 105* 104*   Cardiac Enzymes: No results for input(s): CKTOTAL, CKMB, CKMBINDEX, TROPONINI in the last 168 hours. BNP: BNP (last 3 results) No results for input(s): PROBNP in the last 8760 hours. CBG:  Recent Labs Lab 06/07/14 1954 06/08/14 1637 06/08/14 2107  GLUCAP 153* 103* 82       Signed:  Omran Keelin MD [facility] 06/09/2014, 4:56 PM

## 2014-06-09 NOTE — Progress Notes (Signed)
Clinical Social Work  CSW faxed FL2 and DC summary to [facility] and spoke with Altamese Dilling who is agreeable to accept patient back today. CSW prepared DC packet with FL2, DC summary, DNR, and hard scripts included for new medications and scheduled substances. CSW informed patient and dtr ([name] who is HCPOA) who request that [facility] transport patient and is aware of no guarantee of payment. Dtr reports that patient is not happy about returning to ALF so if dtr transports her then she is worried that patient will not want to go. CSW called for non-emergency transportation and informed RN of DC plans. CM confirmed that palliative services were arranged at ALF. CSW is signing off but available if needed.  [place], [place] [phone]

## 2014-06-09 NOTE — Progress Notes (Signed)
UR completed 

## 2014-06-18 ENCOUNTER — Inpatient Hospital Stay (HOSPITAL_COMMUNITY)
Admission: EM | Admit: 2014-06-18 | Discharge: 2014-06-20 | DRG: 435 | Disposition: A | Discharge status: Hospice | Payer: Medicare Other | Attending: Internal Medicine | Admitting: Internal Medicine

## 2014-06-18 ENCOUNTER — Emergency Department (HOSPITAL_COMMUNITY): Payer: Medicare Other

## 2014-06-18 ENCOUNTER — Encounter (HOSPITAL_COMMUNITY): Payer: Self-pay | Admitting: Emergency Medicine

## 2014-06-18 DIAGNOSIS — Z888 Allergy status to other drugs, medicaments and biological substances status: Secondary | ICD-10-CM | POA: Diagnosis not present

## 2014-06-18 DIAGNOSIS — S0003XA Contusion of scalp, initial encounter: Secondary | ICD-10-CM | POA: Diagnosis present

## 2014-06-18 DIAGNOSIS — W06XXXA Fall from bed, initial encounter: Secondary | ICD-10-CM | POA: Diagnosis present

## 2014-06-18 DIAGNOSIS — R58 Hemorrhage, not elsewhere classified: Secondary | ICD-10-CM | POA: Diagnosis present

## 2014-06-18 DIAGNOSIS — B1920 Unspecified viral hepatitis C without hepatic coma: Secondary | ICD-10-CM | POA: Diagnosis present

## 2014-06-18 DIAGNOSIS — K7290 Hepatic failure, unspecified without coma: Secondary | ICD-10-CM | POA: Diagnosis present

## 2014-06-18 DIAGNOSIS — I10 Essential (primary) hypertension: Secondary | ICD-10-CM | POA: Diagnosis present

## 2014-06-18 DIAGNOSIS — Z515 Encounter for palliative care: Secondary | ICD-10-CM | POA: Diagnosis not present

## 2014-06-18 DIAGNOSIS — G9340 Encephalopathy, unspecified: Secondary | ICD-10-CM | POA: Diagnosis present

## 2014-06-18 DIAGNOSIS — R140 Abdominal distension (gaseous): Secondary | ICD-10-CM | POA: Diagnosis not present

## 2014-06-18 DIAGNOSIS — N390 Urinary tract infection, site not specified: Secondary | ICD-10-CM | POA: Diagnosis present

## 2014-06-18 DIAGNOSIS — S32512K Fracture of superior rim of left pubis, subsequent encounter for fracture with nonunion: Secondary | ICD-10-CM

## 2014-06-18 DIAGNOSIS — S3681XA Injury of peritoneum, initial encounter: Secondary | ICD-10-CM | POA: Diagnosis present

## 2014-06-18 DIAGNOSIS — Z66 Do not resuscitate: Secondary | ICD-10-CM | POA: Diagnosis present

## 2014-06-18 DIAGNOSIS — Z6823 Body mass index (BMI) 23.0-23.9, adult: Secondary | ICD-10-CM | POA: Diagnosis not present

## 2014-06-18 DIAGNOSIS — W19XXXA Unspecified fall, initial encounter: Secondary | ICD-10-CM | POA: Diagnosis present

## 2014-06-18 DIAGNOSIS — G893 Neoplasm related pain (acute) (chronic): Secondary | ICD-10-CM | POA: Diagnosis present

## 2014-06-18 DIAGNOSIS — R627 Adult failure to thrive: Secondary | ICD-10-CM | POA: Diagnosis present

## 2014-06-18 DIAGNOSIS — Z9049 Acquired absence of other specified parts of digestive tract: Secondary | ICD-10-CM | POA: Diagnosis present

## 2014-06-18 DIAGNOSIS — R410 Disorientation, unspecified: Secondary | ICD-10-CM | POA: Diagnosis present

## 2014-06-18 DIAGNOSIS — S32592A Other specified fracture of left pubis, initial encounter for closed fracture: Secondary | ICD-10-CM | POA: Diagnosis present

## 2014-06-18 DIAGNOSIS — R451 Restlessness and agitation: Secondary | ICD-10-CM | POA: Diagnosis present

## 2014-06-18 DIAGNOSIS — C220 Liver cell carcinoma: Secondary | ICD-10-CM | POA: Diagnosis not present

## 2014-06-18 DIAGNOSIS — F319 Bipolar disorder, unspecified: Secondary | ICD-10-CM | POA: Diagnosis present

## 2014-06-18 DIAGNOSIS — Z9119 Patient's noncompliance with other medical treatment and regimen: Secondary | ICD-10-CM | POA: Diagnosis present

## 2014-06-18 DIAGNOSIS — J45909 Unspecified asthma, uncomplicated: Secondary | ICD-10-CM | POA: Diagnosis present

## 2014-06-18 DIAGNOSIS — M25439 Effusion, unspecified wrist: Secondary | ICD-10-CM | POA: Diagnosis present

## 2014-06-18 DIAGNOSIS — D61818 Other pancytopenia: Secondary | ICD-10-CM | POA: Diagnosis present

## 2014-06-18 DIAGNOSIS — J449 Chronic obstructive pulmonary disease, unspecified: Secondary | ICD-10-CM | POA: Diagnosis present

## 2014-06-18 DIAGNOSIS — K7460 Unspecified cirrhosis of liver: Secondary | ICD-10-CM | POA: Diagnosis present

## 2014-06-18 DIAGNOSIS — D696 Thrombocytopenia, unspecified: Secondary | ICD-10-CM | POA: Diagnosis present

## 2014-06-18 DIAGNOSIS — S32512A Fracture of superior rim of left pubis, initial encounter for closed fracture: Secondary | ICD-10-CM | POA: Diagnosis present

## 2014-06-18 DIAGNOSIS — K529 Noninfective gastroenteritis and colitis, unspecified: Secondary | ICD-10-CM | POA: Diagnosis present

## 2014-06-18 DIAGNOSIS — Z87898 Personal history of other specified conditions: Secondary | ICD-10-CM

## 2014-06-18 DIAGNOSIS — M109 Gout, unspecified: Secondary | ICD-10-CM | POA: Diagnosis present

## 2014-06-18 DIAGNOSIS — Z9181 History of falling: Secondary | ICD-10-CM

## 2014-06-18 DIAGNOSIS — S329XXA Fracture of unspecified parts of lumbosacral spine and pelvis, initial encounter for closed fracture: Secondary | ICD-10-CM | POA: Diagnosis present

## 2014-06-18 DIAGNOSIS — R0989 Other specified symptoms and signs involving the circulatory and respiratory systems: Secondary | ICD-10-CM

## 2014-06-18 DIAGNOSIS — K219 Gastro-esophageal reflux disease without esophagitis: Secondary | ICD-10-CM | POA: Diagnosis present

## 2014-06-18 DIAGNOSIS — R109 Unspecified abdominal pain: Secondary | ICD-10-CM | POA: Diagnosis present

## 2014-06-18 LAB — COMPREHENSIVE METABOLIC PANEL
ALBUMIN: 2.5 g/dL — AB (ref 3.5–5.2)
ALK PHOS: 171 U/L — AB (ref 39–117)
ALT: 26 U/L (ref 0–35)
AST: 93 U/L — ABNORMAL HIGH (ref 0–37)
Anion gap: 13 (ref 5–15)
BUN: 29 mg/dL — ABNORMAL HIGH (ref 6–23)
CHLORIDE: 106 meq/L (ref 96–112)
CO2: 23 mEq/L (ref 19–32)
CREATININE: 0.97 mg/dL (ref 0.50–1.10)
Calcium: 9.3 mg/dL (ref 8.4–10.5)
GFR calc Af Amer: 73 mL/min — ABNORMAL LOW (ref >90)
GFR calc non Af Amer: 63 mL/min — ABNORMAL LOW (ref >90)
Glucose, Bld: 119 mg/dL — ABNORMAL HIGH (ref 70–99)
POTASSIUM: 4.2 meq/L (ref 3.7–5.3)
SODIUM: 142 meq/L (ref 137–147)
Total Bilirubin: 1.1 mg/dL (ref 0.3–1.2)
Total Protein: 7.5 g/dL (ref 6.0–8.3)

## 2014-06-18 LAB — CBC WITH DIFFERENTIAL/PLATELET
BASOS PCT: 0 % (ref 0–1)
Basophils Absolute: 0 10*3/uL (ref 0.0–0.1)
EOS PCT: 1 % (ref 0–5)
Eosinophils Absolute: 0 10*3/uL (ref 0.0–0.7)
HEMATOCRIT: 36 % (ref 36.0–46.0)
HEMOGLOBIN: 11.6 g/dL — AB (ref 12.0–15.0)
LYMPHS ABS: 1 10*3/uL (ref 0.7–4.0)
Lymphocytes Relative: 16 % (ref 12–46)
MCH: 31.2 pg (ref 26.0–34.0)
MCHC: 32.2 g/dL (ref 30.0–36.0)
MCV: 96.8 fL (ref 78.0–100.0)
MONO ABS: 1.1 10*3/uL — AB (ref 0.1–1.0)
Monocytes Relative: 18 % — ABNORMAL HIGH (ref 3–12)
Neutro Abs: 4 10*3/uL (ref 1.7–7.7)
Neutrophils Relative %: 65 % (ref 43–77)
Platelets: 143 10*3/uL — ABNORMAL LOW (ref 150–400)
RBC: 3.72 MIL/uL — AB (ref 3.87–5.11)
RDW: 15.2 % (ref 11.5–15.5)
WBC: 6.1 10*3/uL (ref 4.0–10.5)

## 2014-06-18 LAB — URINALYSIS, ROUTINE W REFLEX MICROSCOPIC
Glucose, UA: NEGATIVE mg/dL
Ketones, ur: NEGATIVE mg/dL
Nitrite: NEGATIVE
PROTEIN: NEGATIVE mg/dL
Specific Gravity, Urine: 1.02 (ref 1.005–1.030)
Urobilinogen, UA: 4 mg/dL — ABNORMAL HIGH (ref 0.0–1.0)
pH: 6 (ref 5.0–8.0)

## 2014-06-18 LAB — URINE MICROSCOPIC-ADD ON

## 2014-06-18 LAB — PROTIME-INR
INR: 3.92 — ABNORMAL HIGH (ref 0.00–1.49)
PROTHROMBIN TIME: 38.6 s — AB (ref 11.6–15.2)

## 2014-06-18 LAB — AMMONIA: Ammonia: 46 umol/L (ref 11–60)

## 2014-06-18 LAB — I-STAT CG4 LACTIC ACID, ED: Lactic Acid, Venous: 2.62 mmol/L — ABNORMAL HIGH (ref 0.5–2.2)

## 2014-06-18 MED ORDER — IOHEXOL 300 MG/ML  SOLN
100.0000 mL | Freq: Once | INTRAMUSCULAR | Status: AC | PRN
  Administered 2014-06-18: 100 mL via INTRAVENOUS

## 2014-06-18 MED ORDER — MORPHINE SULFATE 4 MG/ML IJ SOLN
2.0000 mg | Freq: Once | INTRAMUSCULAR | Status: AC
  Administered 2014-06-18: 2 mg via INTRAVENOUS
  Filled 2014-06-18: qty 1
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
## 2014-06-18 NOTE — ED Notes (Signed)
When walking to room, patients oxygen level being 87%, provided oxygen via [place] at 4L/min. Oxygen level has increased to 93%.
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
## 2014-06-18 NOTE — ED Notes (Signed)
Dorothy Burns, NT has attempted to obtain blood for I-stat and attempted Dorothy Larsen RN attempted to draw back from IV. Pt was provided food (apple sauce and milk) with permission from provider earlier. Informed from provider that staff was attempting to obtain blood for I-stat and was eating. Provider wanted to stop with feeding patient. Informed Dorothy Burns, NT. Also, informed provider that patient is still agitated and yelling at staff and pt's father at times. New orders obtained.

## 2014-06-18 NOTE — ED Notes (Signed)
Bed: LN98 Expected date: 06/18/14 Expected time: 2:51 PM Means of arrival: Ambulance Comments: Pt with increased pain

## 2014-06-18 NOTE — ED Notes (Signed)
Provided patient orange juice.  

## 2014-06-18 NOTE — ED Notes (Signed)
Patient is alert, oriented x 2 (Disoriented to place and time). Patient is agitated and cussing stating "that damn doctor ain't coming in 5 minutes", "I want to lay down and take a bath".
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
## 2014-06-19 ENCOUNTER — Encounter (HOSPITAL_COMMUNITY): Payer: Self-pay | Admitting: *Deleted

## 2014-06-19 DIAGNOSIS — K7460 Unspecified cirrhosis of liver: Secondary | ICD-10-CM

## 2014-06-19 DIAGNOSIS — R58 Hemorrhage, not elsewhere classified: Secondary | ICD-10-CM | POA: Diagnosis present

## 2014-06-19 DIAGNOSIS — G893 Neoplasm related pain (acute) (chronic): Secondary | ICD-10-CM

## 2014-06-19 DIAGNOSIS — K529 Noninfective gastroenteritis and colitis, unspecified: Secondary | ICD-10-CM | POA: Diagnosis present

## 2014-06-19 DIAGNOSIS — G9340 Encephalopathy, unspecified: Secondary | ICD-10-CM

## 2014-06-19 DIAGNOSIS — K661 Hemoperitoneum: Secondary | ICD-10-CM

## 2014-06-19 DIAGNOSIS — Z515 Encounter for palliative care: Secondary | ICD-10-CM

## 2014-06-19 DIAGNOSIS — R109 Unspecified abdominal pain: Secondary | ICD-10-CM

## 2014-06-19 DIAGNOSIS — I10 Essential (primary) hypertension: Secondary | ICD-10-CM

## 2014-06-19 LAB — COMPREHENSIVE METABOLIC PANEL
ALT: 25 U/L (ref 0–35)
AST: 92 U/L — ABNORMAL HIGH (ref 0–37)
Albumin: 2.5 g/dL — ABNORMAL LOW (ref 3.5–5.2)
Alkaline Phosphatase: 174 U/L — ABNORMAL HIGH (ref 39–117)
Anion gap: 12 (ref 5–15)
BUN: 29 mg/dL — ABNORMAL HIGH (ref 6–23)
CALCIUM: 9.2 mg/dL (ref 8.4–10.5)
CO2: 22 mEq/L (ref 19–32)
Chloride: 104 mEq/L (ref 96–112)
Creatinine, Ser: 0.79 mg/dL (ref 0.50–1.10)
GLUCOSE: 105 mg/dL — AB (ref 70–99)
Potassium: 4.1 mEq/L (ref 3.7–5.3)
Sodium: 138 mEq/L (ref 137–147)
Total Bilirubin: 1.3 mg/dL — ABNORMAL HIGH (ref 0.3–1.2)
Total Protein: 7.4 g/dL (ref 6.0–8.3)

## 2014-06-19 LAB — CBC
HCT: 34.7 % — ABNORMAL LOW (ref 36.0–46.0)
Hemoglobin: 11 g/dL — ABNORMAL LOW (ref 12.0–15.0)
MCH: 30.6 pg (ref 26.0–34.0)
MCHC: 31.7 g/dL (ref 30.0–36.0)
MCV: 96.7 fL (ref 78.0–100.0)
PLATELETS: 200 10*3/uL (ref 150–400)
RBC: 3.59 MIL/uL — ABNORMAL LOW (ref 3.87–5.11)
RDW: 15.4 % (ref 11.5–15.5)
WBC: 12.3 10*3/uL — AB (ref 4.0–10.5)

## 2014-06-19 LAB — MAGNESIUM: Magnesium: 2.1 mg/dL (ref 1.5–2.5)

## 2014-06-19 LAB — PHOSPHORUS: Phosphorus: 3.2 mg/dL (ref 2.3–4.6)

## 2014-06-19 LAB — PROTIME-INR
INR: 1.27 (ref 0.00–1.49)
PROTHROMBIN TIME: 16 s — AB (ref 11.6–15.2)

## 2014-06-19 LAB — TSH: TSH: 3.38 u[IU]/mL (ref 0.350–4.500)

## 2014-06-19 MED ORDER — ONDANSETRON HCL 4 MG PO TABS: 4.0000 mg | ORAL_TABLET | Freq: Four times a day (QID) | ORAL | Status: DC | PRN

## 2014-06-19 MED ORDER — OXYCODONE HCL 5 MG PO TABS
5.0000 mg | ORAL_TABLET | ORAL | Status: DC | PRN
  Administered 2014-06-19: 5 mg via ORAL
  Filled 2014-06-19: qty 1

## 2014-06-19 MED ORDER — OXYCODONE HCL 5 MG PO TABS
5.0000 mg | ORAL_TABLET | Freq: Four times a day (QID) | ORAL | Status: DC | PRN
  Administered 2014-06-19: 5 mg via ORAL
  Filled 2014-06-19: qty 1

## 2014-06-19 MED ORDER — DOCUSATE SODIUM 100 MG PO CAPS
100.0000 mg | ORAL_CAPSULE | Freq: Two times a day (BID) | ORAL | Status: DC
  Administered 2014-06-19: 100 mg via ORAL
  Filled 2014-06-19 (×5): qty 1

## 2014-06-19 MED ORDER — ONDANSETRON HCL 4 MG/2ML IJ SOLN: 4.0000 mg | Freq: Four times a day (QID) | INTRAMUSCULAR | Status: DC | PRN

## 2014-06-19 MED ORDER — GABAPENTIN 300 MG PO CAPS
300.0000 mg | ORAL_CAPSULE | Freq: Three times a day (TID) | ORAL | Status: DC
  Administered 2014-06-19 (×2): 300 mg via ORAL
  Filled 2014-06-19 (×7): qty 1

## 2014-06-19 MED ORDER — PIPERACILLIN-TAZOBACTAM 3.375 G IVPB
3.3750 g | Freq: Three times a day (TID) | INTRAVENOUS | Status: DC
  Administered 2014-06-19 – 2014-06-20 (×4): 3.375 g via INTRAVENOUS
  Filled 2014-06-19 (×6): qty 50

## 2014-06-19 MED ORDER — BUDESONIDE-FORMOTEROL FUMARATE 160-4.5 MCG/ACT IN AERO
2.0000 | INHALATION_SPRAY | Freq: Two times a day (BID) | RESPIRATORY_TRACT | Status: DC
  Administered 2014-06-19 (×2): 2 via RESPIRATORY_TRACT
  Filled 2014-06-19 (×2): qty 6

## 2014-06-19 MED ORDER — MORPHINE SULFATE 4 MG/ML IJ SOLN
2.0000 mg | INTRAMUSCULAR | Status: DC | PRN
  Administered 2014-06-19 – 2014-06-20 (×4): 2 mg via INTRAVENOUS
  Filled 2014-06-19 (×3): qty 1

## 2014-06-19 MED ORDER — COLCHICINE 0.6 MG PO TABS
0.6000 mg | ORAL_TABLET | Freq: Every day | ORAL | Status: DC
  Administered 2014-06-19: 0.6 mg via ORAL
  Filled 2014-06-19 (×4): qty 1

## 2014-06-19 MED ORDER — SODIUM CHLORIDE 0.9 % IJ SOLN
3.0000 mL | Freq: Two times a day (BID) | INTRAMUSCULAR | Status: DC
  Administered 2014-06-19 (×2): 3 mL via INTRAVENOUS

## 2014-06-19 MED ORDER — CLONAZEPAM 1 MG PO TABS
2.0000 mg | ORAL_TABLET | Freq: Every day | ORAL | Status: DC
  Administered 2014-06-19 (×2): 2 mg via ORAL
  Filled 2014-06-19: qty 4
  Filled 2014-06-19: qty 2

## 2014-06-19 MED ORDER — LACTULOSE 10 GM/15ML PO SOLN
20.0000 g | Freq: Two times a day (BID) | ORAL | Status: DC
  Filled 2014-06-19 (×5): qty 30

## 2014-06-19 MED ORDER — NICOTINE 21 MG/24HR TD PT24
21.0000 mg | MEDICATED_PATCH | Freq: Every day | TRANSDERMAL | Status: DC
Start: 2014-06-19 — End: 2014-06-20
  Administered 2014-06-19: 21 mg via TRANSDERMAL
  Filled 2014-06-19 (×2): qty 1

## 2014-06-19 MED ORDER — MEGESTROL ACETATE 400 MG/10ML PO SUSP
400.0000 mg | Freq: Every day | ORAL | Status: DC
  Administered 2014-06-19: 400 mg via ORAL
  Filled 2014-06-19: qty 10

## 2014-06-19 MED ORDER — PANTOPRAZOLE SODIUM 40 MG PO TBEC
40.0000 mg | DELAYED_RELEASE_TABLET | Freq: Every day | ORAL | Status: DC
  Administered 2014-06-19: 40 mg via ORAL
  Filled 2014-06-19 (×3): qty 1

## 2014-06-19 MED ORDER — MORPHINE SULFATE 2 MG/ML IJ SOLN
INTRAMUSCULAR | Status: AC
  Filled 2014-06-19: qty 1

## 2014-06-19 MED ORDER — LORAZEPAM 2 MG/ML IJ SOLN: 1.0000 mg | INTRAMUSCULAR | Status: DC | PRN

## 2014-06-19 MED ORDER — METHOCARBAMOL 500 MG PO TABS
500.0000 mg | ORAL_TABLET | Freq: Three times a day (TID) | ORAL | Status: DC | PRN
  Administered 2014-06-19: 500 mg via ORAL
  Filled 2014-06-19: qty 1

## 2014-06-19 MED ORDER — MORPHINE SULFATE 2 MG/ML IJ SOLN: 1.0000 mg | INTRAMUSCULAR | Status: DC | PRN

## 2014-06-19 MED ORDER — ENSURE COMPLETE PO LIQD
237.0000 mL | Freq: Three times a day (TID) | ORAL | Status: DC
  Administered 2014-06-19 (×3): 237 mL via ORAL

## 2014-06-19 MED ORDER — ALBUTEROL SULFATE (2.5 MG/3ML) 0.083% IN NEBU: 2.5000 mg | INHALATION_SOLUTION | RESPIRATORY_TRACT | Status: DC | PRN

## 2014-06-19 MED ORDER — DIVALPROEX SODIUM 250 MG PO DR TAB
250.0000 mg | DELAYED_RELEASE_TABLET | Freq: Two times a day (BID) | ORAL | Status: DC
  Administered 2014-06-19 (×2): 250 mg via ORAL
  Filled 2014-06-19 (×5): qty 1

## 2014-06-19 NOTE — Plan of Care (Signed)
Problem: Phase I Progression Outcomes Goal: Hemodynamically stable Outcome: Completed/Met Date Met:  06/19/14     

## 2014-06-19 NOTE — Progress Notes (Signed)
CT scan of the abdomen showed evidence of acute extraperitoneal hematoma noted tracking along the left pelvic sidewall and potential space deep to the left rectus abdominus muscle, in association with a mildly displaced slightly comminuted acute fracture through the medial aspect of the left superior and inferior pubic rami. The hematoma measuresapproximately 10.1 x 10.1 x 6.4 cm. we will also evidence of segmental colitis.  Discussed findings of hematoma with patient's daughter Carey Bullocks for number [phone] who is patient's  medical power of attorney. At this point she would like for patient to be comfort care only. Concentrate on pain management. She does not wish FFP administration or any interventions given overall patient's poor prognosis. Palliative care consult to be called in the morning.  Given colitis to help with pain management will treat with antibiotics.  Bebe Moncure 1:27 AM

## 2014-06-19 NOTE — Progress Notes (Signed)
CSW consulted for assistance with d/c planning. Pt sleeping soundly. No family at bedside. PN reviewed. Palliative medicine consult pending. CSW will return 12/1 to assist with d/c planning.  Werner Lean LCSW [phone]

## 2014-06-19 NOTE — ED Notes (Signed)
Patient is not cussing at staff members. Pt is alert but disoriented place and time. She is constantly talking about things that didn't occur to day.

## 2014-06-19 NOTE — ED Notes (Signed)
Pt in CT.
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
## 2014-06-19 NOTE — ED Notes (Signed)
Carey Bullocks (Patients daughter) [place] [phone]
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
## 2014-06-19 NOTE — ED Notes (Signed)
Patient appears to be less agitated.

## 2014-06-20 DIAGNOSIS — S32512A Fracture of superior rim of left pubis, initial encounter for closed fracture: Secondary | ICD-10-CM

## 2014-06-20 LAB — CBC
HEMATOCRIT: 32.7 % — AB (ref 36.0–46.0)
Hemoglobin: 10.5 g/dL — ABNORMAL LOW (ref 12.0–15.0)
MCH: 31 pg (ref 26.0–34.0)
MCHC: 32.1 g/dL (ref 30.0–36.0)
MCV: 96.5 fL (ref 78.0–100.0)
Platelets: 198 10*3/uL (ref 150–400)
RBC: 3.39 MIL/uL — ABNORMAL LOW (ref 3.87–5.11)
RDW: 15.6 % — AB (ref 11.5–15.5)
WBC: 9.3 10*3/uL (ref 4.0–10.5)

## 2014-06-20 LAB — BASIC METABOLIC PANEL
Anion gap: 12 (ref 5–15)
BUN: 29 mg/dL — AB (ref 6–23)
CO2: 24 mEq/L (ref 19–32)
Calcium: 9.1 mg/dL (ref 8.4–10.5)
Chloride: 107 mEq/L (ref 96–112)
Creatinine, Ser: 0.83 mg/dL (ref 0.50–1.10)
GFR calc non Af Amer: 76 mL/min — ABNORMAL LOW (ref >90)
GFR, EST AFRICAN AMERICAN: 88 mL/min — AB (ref >90)
Glucose, Bld: 99 mg/dL (ref 70–99)
POTASSIUM: 4 meq/L (ref 3.7–5.3)
SODIUM: 143 meq/L (ref 137–147)

## 2014-06-20 MED ORDER — MORPHINE SULFATE (CONCENTRATE) 10 MG/0.5ML PO SOLN: 10.0000 mg | ORAL | Status: AC | PRN

## 2014-06-20 MED ORDER — LORAZEPAM 2 MG/ML IJ SOLN: 1.0000 mg | INTRAMUSCULAR | Status: AC | PRN

## 2014-06-20 MED ORDER — LORAZEPAM 2 MG/ML IJ SOLN: 1.0000 mg | Freq: Two times a day (BID) | INTRAMUSCULAR | Status: DC

## 2014-06-20 MED ORDER — MORPHINE SULFATE 4 MG/ML IJ SOLN: 2.0000 mg | INTRAMUSCULAR | Status: AC | PRN

## 2014-06-20 MED ORDER — MORPHINE SULFATE (CONCENTRATE) 10 MG/0.5ML PO SOLN: 10.0000 mg | ORAL | Status: DC | PRN

## 2014-06-20 MED ORDER — LORAZEPAM 2 MG/ML IJ SOLN: 1.0000 mg | Freq: Two times a day (BID) | INTRAMUSCULAR | Status: AC

## 2014-06-20 NOTE — Progress Notes (Signed)
Clinical Social Work Department BRIEF PSYCHOSOCIAL ASSESSMENT 06/20/2014  Patient:  Dorothy Burns, Dorothy Burns     Account Number:  [id number]     Admit date:  06/18/2014  Clinical Social Worker:  Lacie Scotts  Date/Time:  06/20/2014 10:31 AM  Referred by:  Physician  Date Referred:  06/19/2014 Referred for  Residential hospice placement   Other Referral:   Interview type:  Family Other interview type:    PSYCHOSOCIAL DATA Living Status:  FACILITY Admitted from facility:  [facility] Level of care:  Assisted Living Primary support name:  Carey Bullocks Primary support relationship to patient:  CHILD, ADULT Degree of support available:   supportive    CURRENT CONCERNS Current Concerns  Post-Acute Placement   Other Concerns:    SOCIAL WORK ASSESSMENT / PLAN Pt is a 58 yr old female admitted from [facility] on 06/18/14 due to a fall resulting in a closed fx of left superior pubic ramus. Surgery not recommended. CSW consulted to assist with d/c planning. PN reviewed. Palliative Care Medicine Team is recommending Residential Hospice Home placement. Pt's daughter , Carey Bullocks ( [phone] ) contacted and choice provided. [name] has chosen [facility] for placement. Liaison contacted and referral provided. CSW will continue to follow to assist with d/c planning needs.   Assessment/plan status:  Psychosocial Support/Ongoing Assessment of Needs Other assessment/ plan:   Information/referral to community resources:   Residential Hospice Home choice provided.    PATIENT'S/FAMILY'S RESPONSE TO PLAN OF CARE: Daughter is hopeful that [facility] will have an opening for pt.    Werner Lean LCSW [phone]
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
## 2014-06-20 NOTE — Progress Notes (Signed)
Contacted by [employer] , Erling Conte, LCSW. [facility] bed available today. Admission papers completed by daughter. Nsg has called report. [facility] transport arranged.  Werner Lean LCSW [phone]

## 2014-07-21 DEATH — deceased

## 2016-03-06 IMAGING — CT CT ABD-PELV W/ CM
2 of 5 series · 15 of 46 positions shown, 17 images · IV contrast (Omni 300)
Comparison: 12/22/2013

CLINICAL DATA: Abdominal pain in multiple areas, including right
upper quadrant.

EXAM:
CT ABDOMEN AND PELVIS WITH CONTRAST
TECHNIQUE: Multidetector CT imaging of the abdomen and pelvis was performed
using the standard protocol following bolus administration of
intravenous contrast.
CONTRAST:  100mL OMNIPAQUE IOHEXOL 300 MG/ML  SOLN

[Series 2: abd/ pelvis 5.0 i30f 1 · axial · 0.72mm/px · z∈[-323,+97]mm · 12 of 94 slices shown, 14 images]
[im 5/94  soft-tissue]
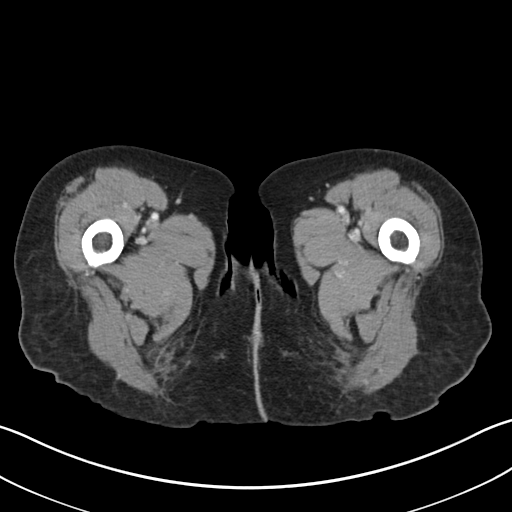
[im 5/94  bone]
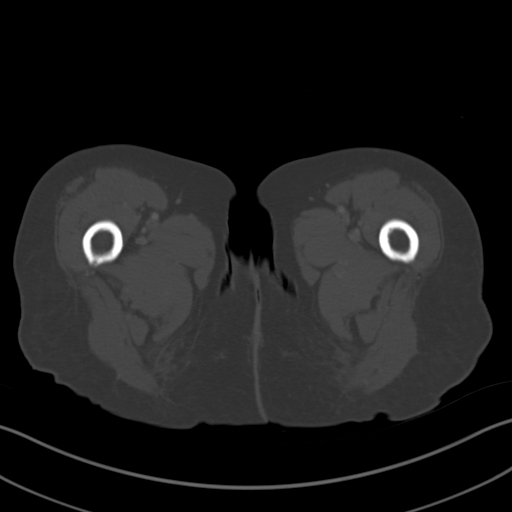
[im 14/94  soft-tissue]
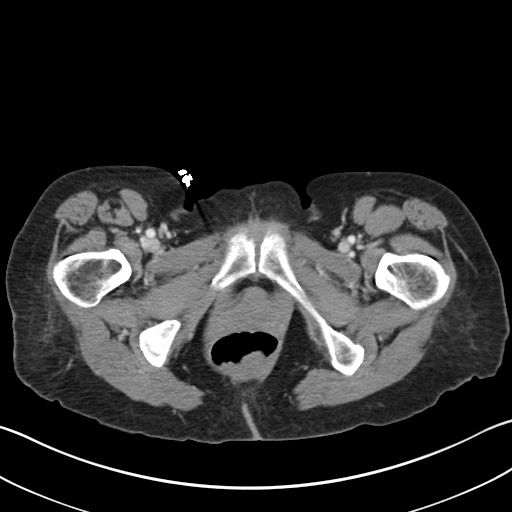
[im 19/94  soft-tissue]
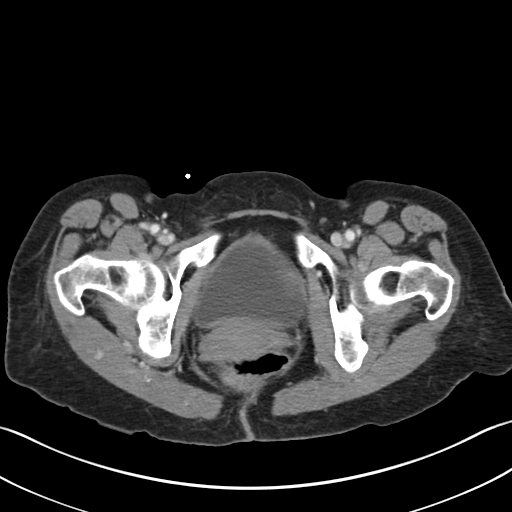
[im 28/94  soft-tissue]
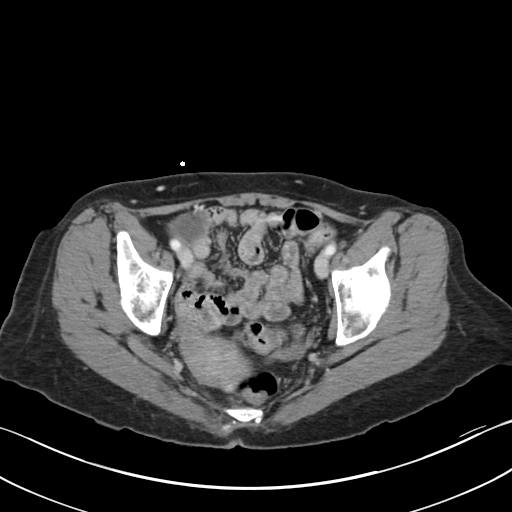
[im 38/94  soft-tissue]
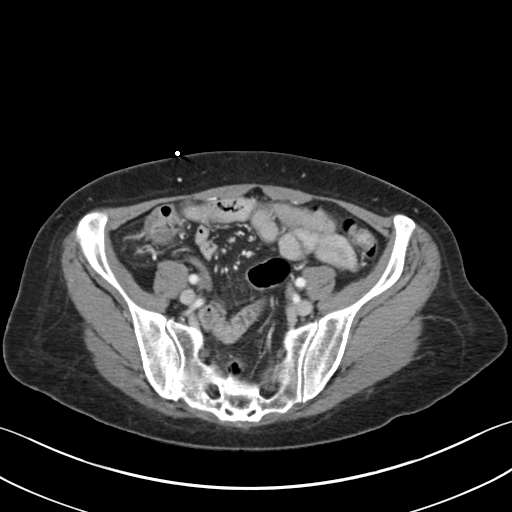
[im 42/94  soft-tissue]
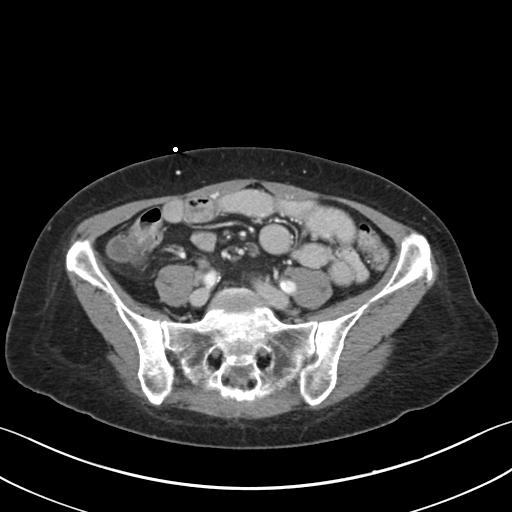
[im 52/94  soft-tissue]
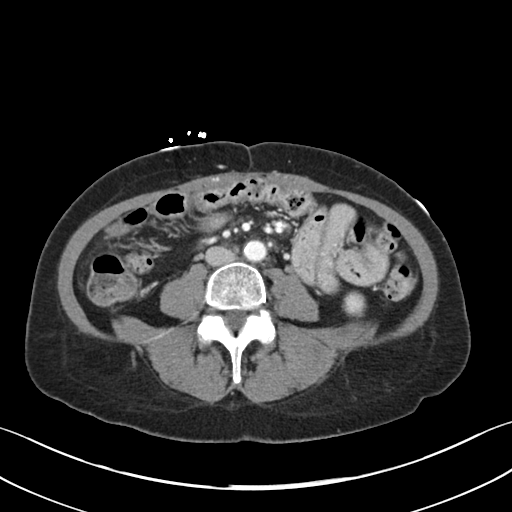
[im 56/94  soft-tissue]
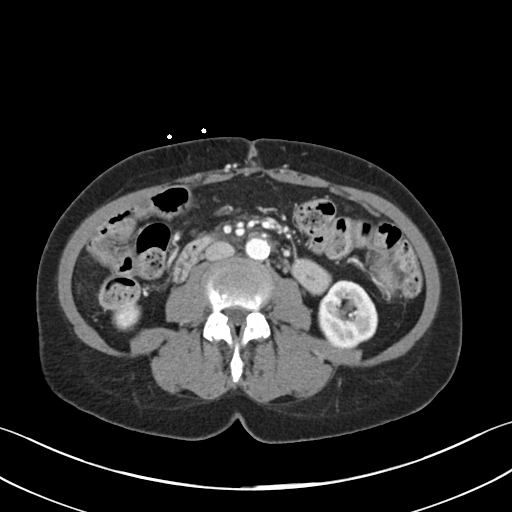
[im 66/94  soft-tissue]
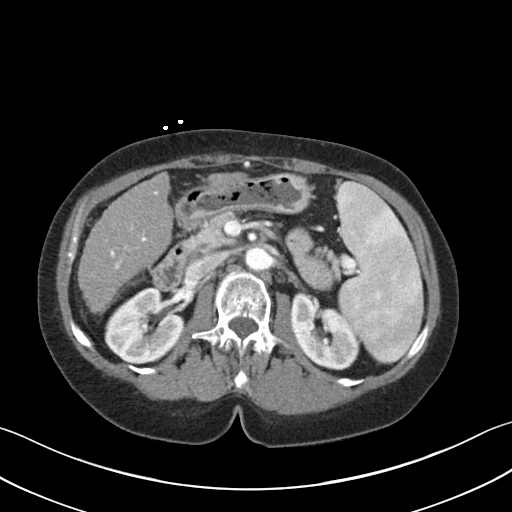
[im 66/94  bone]
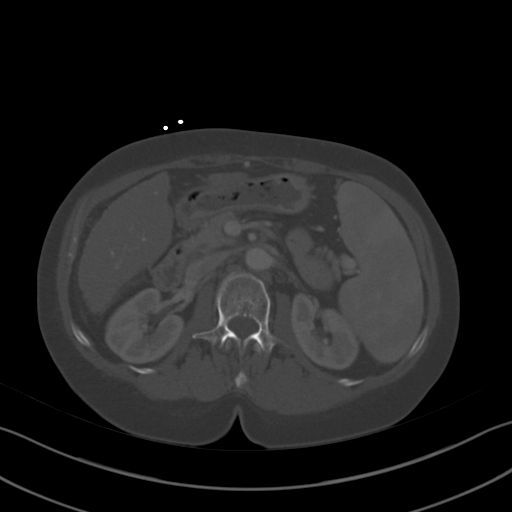
[im 75/94  soft-tissue]
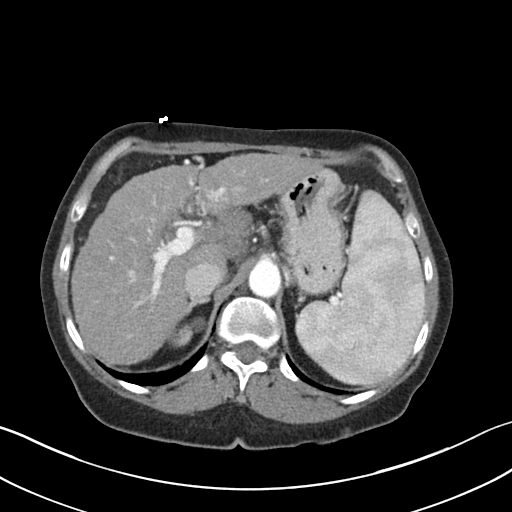
[im 80/94  soft-tissue]
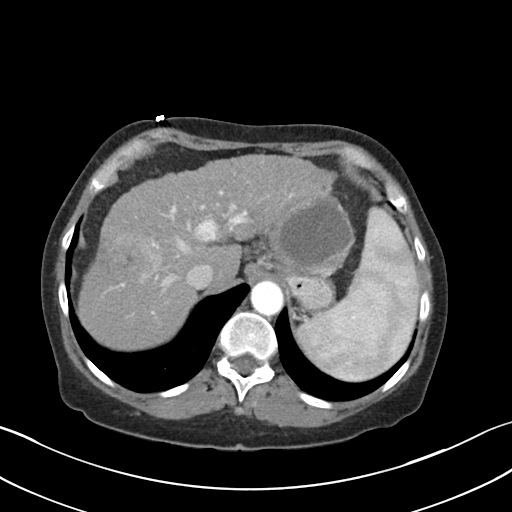
[im 89/94  soft-tissue]
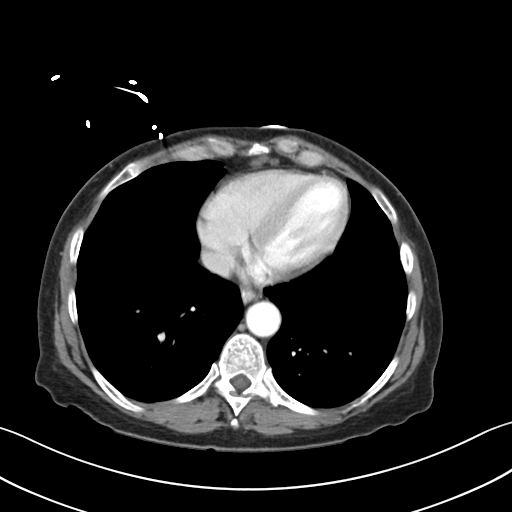

[Series 5: coronals · coronal · 0.74mm/px · 3 of 110 slices shown]
[im 37/110  soft-tissue]
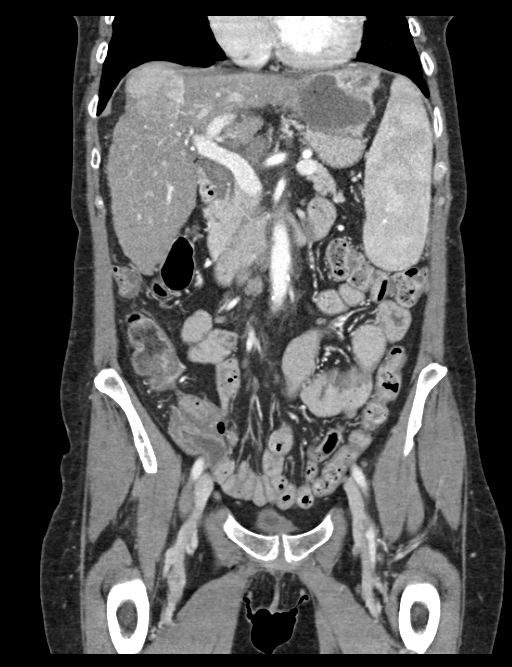
[im 49/110  soft-tissue]
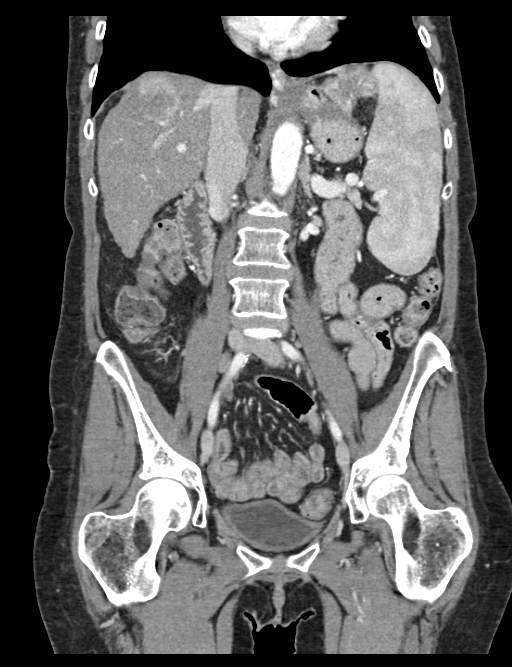
[im 61/110  soft-tissue]
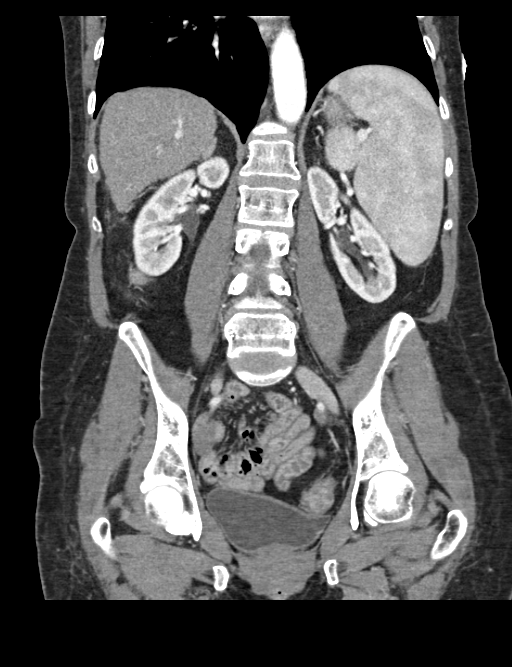

[15 of 46 positions shown; findings below may reference images not displayed]

FINDINGS: BODY WALL: Unremarkable.

LOWER CHEST: Unremarkable.

ABDOMEN/PELVIS:

Liver: Liver cirrhosis with small lobulated liver, fissure
enlargement, and recannulated periumbilical vein. Two enhancing
masses consistent with hepatocellular carcinoma. A 4.8 cm mass in
segment eight appears larger than on December 23, 2013 abdominal CT, but
stable in size compared to 12/15/2013 MRI. Differences on CT may be
related to imaging phase. The recently treated left hepatic hepatic
cellular carcinoma has a shrunken enhancing portion but new medial
in inferiorly directed cavitary portion which measures 5.3 cm. The
collection contains a few bubbles of anti dependent gas. There is a
mildly enhancing capsule around this fluid component. The fluid
collection has mass effect on the upper stomach, without definitive
fistulization based on continuity of the gastric mucosa. Stable
adenopathy in the deep liver drainage, occluding the porta hepatis
and portacaval stations. Lower periesophageal varices.

Biliary: Cholecystectomy. No intrahepatic biliary ductal dilatation
suggestive of biliary necrosis.

Pancreas: Unremarkable.

Spleen: Splenomegaly in the setting of portal hypertension.

Adrenals: Unremarkable.

Kidneys and ureters: No hydronephrosis or stone.

Bladder: Unremarkable.

Reproductive: Stable appearance of the uterus and ovaries.

Bowel: No obstruction.

Retroperitoneum: No mass or adenopathy.

Peritoneum: No ascites or pneumoperitoneum.

Vascular: No acute abnormality.

OSSEOUS: No acute abnormalities.
IMPRESSION: 1. Cirrhosis with 2 hepatocellular carcinomas. New 5 cm fluid and
gas collection associated with recently treated left lobe mass which
could represent abscess, sterile necrosis, or biloma.
2. Stable adenopathy in the deep liver drainage.
3. Portal hypertension with lower esophageal varices.
